# Patient Record
Sex: Male | Born: 1960 | Race: White | Hispanic: No | Smoking: Current every day smoker
Health system: Southern US, Community
[De-identification: ages and names within clinical notes are randomized; demographics above are authoritative.]

## PROBLEM LIST (undated history)

## (undated) DIAGNOSIS — I219 Acute myocardial infarction, unspecified: Secondary | ICD-10-CM

## (undated) DIAGNOSIS — E119 Type 2 diabetes mellitus without complications: Secondary | ICD-10-CM

## (undated) DIAGNOSIS — F1193 Opioid use, unspecified with withdrawal: Secondary | ICD-10-CM

## (undated) DIAGNOSIS — H269 Unspecified cataract: Secondary | ICD-10-CM

## (undated) DIAGNOSIS — F32A Depression, unspecified: Secondary | ICD-10-CM

## (undated) DIAGNOSIS — F1123 Opioid dependence with withdrawal: Secondary | ICD-10-CM

## (undated) DIAGNOSIS — F419 Anxiety disorder, unspecified: Secondary | ICD-10-CM

## (undated) DIAGNOSIS — K219 Gastro-esophageal reflux disease without esophagitis: Secondary | ICD-10-CM

## (undated) DIAGNOSIS — R0902 Hypoxemia: Secondary | ICD-10-CM

## (undated) DIAGNOSIS — K769 Liver disease, unspecified: Secondary | ICD-10-CM

## (undated) DIAGNOSIS — I639 Cerebral infarction, unspecified: Secondary | ICD-10-CM

## (undated) DIAGNOSIS — J45909 Unspecified asthma, uncomplicated: Secondary | ICD-10-CM

## (undated) DIAGNOSIS — I10 Essential (primary) hypertension: Secondary | ICD-10-CM

## (undated) DIAGNOSIS — R918 Other nonspecific abnormal finding of lung field: Secondary | ICD-10-CM

## (undated) DIAGNOSIS — E785 Hyperlipidemia, unspecified: Secondary | ICD-10-CM

## (undated) DIAGNOSIS — I509 Heart failure, unspecified: Secondary | ICD-10-CM

## (undated) DIAGNOSIS — Z720 Tobacco use: Secondary | ICD-10-CM

## (undated) DIAGNOSIS — F329 Major depressive disorder, single episode, unspecified: Secondary | ICD-10-CM

## (undated) DIAGNOSIS — J449 Chronic obstructive pulmonary disease, unspecified: Secondary | ICD-10-CM

## (undated) DIAGNOSIS — M1990 Unspecified osteoarthritis, unspecified site: Secondary | ICD-10-CM

## (undated) DIAGNOSIS — C801 Malignant (primary) neoplasm, unspecified: Secondary | ICD-10-CM

## (undated) DIAGNOSIS — N289 Disorder of kidney and ureter, unspecified: Secondary | ICD-10-CM

## (undated) DIAGNOSIS — J439 Emphysema, unspecified: Secondary | ICD-10-CM

## (undated) HISTORY — DX: Anxiety disorder, unspecified: F41.9

## (undated) HISTORY — DX: Gastro-esophageal reflux disease without esophagitis: K21.9

## (undated) HISTORY — DX: Major depressive disorder, single episode, unspecified: F32.9

## (undated) HISTORY — DX: Emphysema, unspecified: J43.9

## (undated) HISTORY — DX: Heart failure, unspecified: I50.9

## (undated) HISTORY — DX: Opioid use, unspecified with withdrawal: F11.93

## (undated) HISTORY — DX: Acute myocardial infarction, unspecified: I21.9

## (undated) HISTORY — DX: Opioid dependence with withdrawal: F11.23

## (undated) HISTORY — DX: Hypoxemia: R09.02

## (undated) HISTORY — DX: Depression, unspecified: F32.A

## (undated) HISTORY — PX: THROAT SURGERY: SHX803

## (undated) HISTORY — DX: Unspecified osteoarthritis, unspecified site: M19.90

## (undated) HISTORY — DX: Unspecified cataract: H26.9

## (undated) HISTORY — PX: NECK SURGERY: SHX720

## (undated) HISTORY — DX: Hyperlipidemia, unspecified: E78.5

---

## 2017-07-01 ENCOUNTER — Emergency Department (HOSPITAL_COMMUNITY)

## 2017-07-01 ENCOUNTER — Observation Stay (HOSPITAL_COMMUNITY)
Admission: EM | Admit: 2017-07-01 | Discharge: 2017-07-01 | Discharge status: Against Medical Advice | Attending: Cardiology | Admitting: Cardiology

## 2017-07-01 ENCOUNTER — Observation Stay (HOSPITAL_COMMUNITY)

## 2017-07-01 ENCOUNTER — Encounter (HOSPITAL_COMMUNITY): Payer: Self-pay | Admitting: *Deleted

## 2017-07-01 DIAGNOSIS — Z7902 Long term (current) use of antithrombotics/antiplatelets: Secondary | ICD-10-CM | POA: Diagnosis not present

## 2017-07-01 DIAGNOSIS — F17210 Nicotine dependence, cigarettes, uncomplicated: Secondary | ICD-10-CM | POA: Diagnosis not present

## 2017-07-01 DIAGNOSIS — I200 Unstable angina: Secondary | ICD-10-CM | POA: Diagnosis present

## 2017-07-01 DIAGNOSIS — Z794 Long term (current) use of insulin: Secondary | ICD-10-CM | POA: Diagnosis not present

## 2017-07-01 DIAGNOSIS — Z8673 Personal history of transient ischemic attack (TIA), and cerebral infarction without residual deficits: Secondary | ICD-10-CM | POA: Insufficient documentation

## 2017-07-01 DIAGNOSIS — Z79899 Other long term (current) drug therapy: Secondary | ICD-10-CM | POA: Diagnosis not present

## 2017-07-01 DIAGNOSIS — Z9981 Dependence on supplemental oxygen: Secondary | ICD-10-CM | POA: Diagnosis not present

## 2017-07-01 DIAGNOSIS — J449 Chronic obstructive pulmonary disease, unspecified: Secondary | ICD-10-CM | POA: Insufficient documentation

## 2017-07-01 DIAGNOSIS — I10 Essential (primary) hypertension: Secondary | ICD-10-CM | POA: Diagnosis not present

## 2017-07-01 DIAGNOSIS — E119 Type 2 diabetes mellitus without complications: Secondary | ICD-10-CM | POA: Insufficient documentation

## 2017-07-01 DIAGNOSIS — I700 Atherosclerosis of aorta: Secondary | ICD-10-CM | POA: Insufficient documentation

## 2017-07-01 DIAGNOSIS — C3490 Malignant neoplasm of unspecified part of unspecified bronchus or lung: Secondary | ICD-10-CM | POA: Insufficient documentation

## 2017-07-01 DIAGNOSIS — R0602 Shortness of breath: Secondary | ICD-10-CM | POA: Insufficient documentation

## 2017-07-01 HISTORY — DX: Chronic obstructive pulmonary disease, unspecified: J44.9

## 2017-07-01 HISTORY — DX: Other nonspecific abnormal finding of lung field: R91.8

## 2017-07-01 HISTORY — DX: Essential (primary) hypertension: I10

## 2017-07-01 HISTORY — DX: Malignant (primary) neoplasm, unspecified: C80.1

## 2017-07-01 HISTORY — DX: Unspecified asthma, uncomplicated: J45.909

## 2017-07-01 HISTORY — DX: Liver disease, unspecified: K76.9

## 2017-07-01 HISTORY — DX: Disorder of kidney and ureter, unspecified: N28.9

## 2017-07-01 HISTORY — DX: Cerebral infarction, unspecified: I63.9

## 2017-07-01 HISTORY — DX: Type 2 diabetes mellitus without complications: E11.9

## 2017-07-01 LAB — BASIC METABOLIC PANEL
Anion gap: 9 (ref 5–15)
BUN: 15 mg/dL (ref 6–20)
CHLORIDE: 103 mmol/L (ref 101–111)
CO2: 23 mmol/L (ref 22–32)
CREATININE: 0.84 mg/dL (ref 0.61–1.24)
Calcium: 9.2 mg/dL (ref 8.9–10.3)
GFR calc Af Amer: >60 mL/min (ref >60)
Glucose, Bld: 208 mg/dL — ABNORMAL HIGH (ref 65–99)
Potassium: 4.1 mmol/L (ref 3.5–5.1)
SODIUM: 135 mmol/L (ref 135–145)

## 2017-07-01 LAB — PROTIME-INR
INR: 1.04
Prothrombin Time: 13.6 seconds (ref 11.4–15.2)

## 2017-07-01 LAB — I-STAT CHEM 8, ED
BUN: 16 mg/dL (ref 6–20)
CALCIUM ION: 1.02 mmol/L — AB (ref 1.15–1.40)
CHLORIDE: 103 mmol/L (ref 101–111)
CREATININE: 0.7 mg/dL (ref 0.61–1.24)
GLUCOSE: 209 mg/dL — AB (ref 65–99)
HCT: 58 % — ABNORMAL HIGH (ref 39.0–52.0)
Hemoglobin: 19.7 g/dL — ABNORMAL HIGH (ref 13.0–17.0)
Potassium: 4.1 mmol/L (ref 3.5–5.1)
Sodium: 137 mmol/L (ref 135–145)
TCO2: 21 mmol/L (ref 0–100)

## 2017-07-01 LAB — CBC
HCT: 52.9 % — ABNORMAL HIGH (ref 39.0–52.0)
Hemoglobin: 18.4 g/dL — ABNORMAL HIGH (ref 13.0–17.0)
MCH: 28.9 pg (ref 26.0–34.0)
MCHC: 34.8 g/dL (ref 30.0–36.0)
MCV: 83.2 fL (ref 78.0–100.0)
PLATELETS: 330 10*3/uL (ref 150–400)
RBC: 6.36 MIL/uL — ABNORMAL HIGH (ref 4.22–5.81)
RDW: 13.7 % (ref 11.5–15.5)
WBC: 14.6 10*3/uL — AB (ref 4.0–10.5)

## 2017-07-01 LAB — I-STAT TROPONIN, ED: Troponin i, poc: 0 ng/mL (ref 0.00–0.08)

## 2017-07-01 LAB — APTT: APTT: 30 s (ref 24–36)

## 2017-07-01 LAB — TROPONIN I: Troponin I: <0.03 ng/mL (ref <0.03)

## 2017-07-01 MED ORDER — NITROGLYCERIN 0.4 MG SL SUBL
0.4000 mg | SUBLINGUAL_TABLET | SUBLINGUAL | Status: DC | PRN
  Filled 2017-07-01: qty 1

## 2017-07-01 MED ORDER — NITROGLYCERIN IN D5W 200-5 MCG/ML-% IV SOLN
5.0000 ug/min | Freq: Once | INTRAVENOUS | Status: AC
  Administered 2017-07-01: 5 ug/min via INTRAVENOUS
  Filled 2017-07-01: qty 250

## 2017-07-01 MED ORDER — MORPHINE SULFATE (PF) 4 MG/ML IV SOLN
4.0000 mg | INTRAVENOUS | Status: DC | PRN
  Administered 2017-07-01: 4 mg via INTRAVENOUS
  Filled 2017-07-01: qty 1

## 2017-07-01 MED ORDER — ASPIRIN 81 MG PO CHEW
324.0000 mg | CHEWABLE_TABLET | Freq: Once | ORAL | Status: AC
  Administered 2017-07-01: 324 mg via ORAL
  Filled 2017-07-01: qty 4

## 2017-07-01 MED ORDER — HEPARIN BOLUS VIA INFUSION: 4000.0000 [IU] | Freq: Once | INTRAVENOUS | Status: DC

## 2017-07-01 MED ORDER — HEPARIN (PORCINE) IN NACL 100-0.45 UNIT/ML-% IJ SOLN: 1150.0000 [IU]/h | INTRAMUSCULAR | Status: DC

## 2017-07-01 NOTE — ED Triage Notes (Signed)
Pt comes in with central chest pain starting around 1000 today. Pt has taken 3 nitro at home. He is a hospice patient and had had 2 stents placed.

## 2017-07-01 NOTE — ED Notes (Signed)
Pt now stating he is wanting to leave at this time d/t hospice dropping pt if he is transferred to [facility]. Attempted to inform pt about this situation and severity of condition. DO McManus notified at talked to pt. Case manager to talk to pt as well.

## 2017-07-01 NOTE — Care Management (Signed)
CM received call from EDP about pt refusing care due to hospice telling him he will be discharged if admitted to hospital. He is active with [facility] care and hospice. CM explained that pt will DC be discharged from hospice services, his medicare will pay for hospitalization and hospice will resume services when he is discharged. Pt states they told him they will not. He has been on hospice 8 years and has appointment with Dr. Nyra Capes 07/21/17 to establish care and at that time he will DC from hospice services. CM explained to pt that CM dept would ensure he has PCP and care at DC. That we have other options and we make sure to have him care established at DC. Pt says he wishes to leave AMA and will think about it for a few mins. CM called Judeen Hammans, with hospice provider, who states pt requested yesterday that his records be sent to hospice of [facility] and he wished to DC from their services. She also states pt is a drug seeker and even presented to their office the morning of 8/6 demanding pain medications. Since pt has left AMA he has contacted hospice provider and requested RN make visit tomorrow, visit was offered for tonight and pt refuses. Per sherry they also instructed pt to return to ED to receive needed medical care.

## 2017-07-01 NOTE — Progress Notes (Signed)
ANTICOAGULATION CONSULT NOTE - Initial Consult  Pharmacy Consult for Heparin Indication: chest pain/ACS  No Known Allergies  Patient Measurements: Height: 5\' 6"  (167.6 cm) Weight: 198 lb (89.8 kg) IBW/kg (Calculated) : 63.8 HEPARIN DW (KG): 82.8   Vital Signs: Temp: 98.4 F (36.9 C) (08/07 1315) Temp Source: Oral (08/07 1315) BP: 113/72 (08/07 1430) Pulse Rate: 103 (08/07 1430)  Labs:  Recent Labs  07/01/17 1315 07/01/17 1345  HGB 18.4* 19.7*  HCT 52.9* 58.0*  PLT 330  --   CREATININE 0.84 0.70  TROPONINI <0.03  --     Estimated Creatinine Clearance: 112.1 mL/min (by C-G formula based on SCr of 0.7 mg/dL).   Medical History: Past Medical History:  Diagnosis Date  . Asthma   . 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 COPD (chronic obstructive pulmonary disease) ([place])   . Diabetes mellitus without complication ([place])   . Hypertension   . Kidney disease   . Liver disease   . Lung mass   . Stroke [facility])    Medications:   (Not in a hospital admission)  Home meds reviewed.  Not on Anticoagulant PTA.  Assessment: Okay for Protocol. No bleeding noted, baseline labs pending.  Likely Tx to [facility] per pending ED note.  Goal of Therapy:  Heparin level 0.3-0.7 units/ml Monitor platelets by anticoagulation protocol: Yes   Plan:  Give 4000 units bolus x 1 Start heparin infusion at 1150 units/hr Check anti-Xa level in 6-8 hours and daily while on heparin Continue to monitor H&H and platelets  Pricilla Larsson 07/01/2017,2:48 PM

## 2017-07-01 NOTE — ED Notes (Signed)
Pt notified of possible worsening of medical conditions, permanent ailments, and death by this RN and EDP Thurnell Garbe. States he accepts these risks and is signing out [place]. Melinda at hospice care notified of pt's decision. Ambulatory to lobby at this time.

## 2017-07-01 NOTE — ED Notes (Signed)
Rip Harbour from hospice [phone]) called this RN to get update on Pt.

## 2017-07-01 NOTE — ED Notes (Signed)
EKG given to Dr. McManus.  

## 2017-07-01 NOTE — ED Notes (Signed)
I-stat troponin 0.00 result given to MD Thurnell Garbe.

## 2017-07-01 NOTE — ED Notes (Signed)
MD Thurnell Garbe notified of pt's conversation with hospice at this time. Per Hospice pt stated that he was going to die tonight so be prepared and he needed pain medicine. Pt still wanting to leave AMA.

## 2017-07-01 NOTE — ED Provider Notes (Signed)
[facility] DEPT Provider Note   CSN: [id number] Arrival date & time: 07/01/17  1259     History   Chief Complaint Chief Complaint  Patient presents with  . Chest Pain    HPI Roger Flores is a 56 y.o. male.  HPI  Pt was seen at 1320. Per pt, c/o gradual onset and worsening of multiple intermittent episodes of chest "pain" since yesterday. Pt describes the CP as mid-sternal, "tightness," with radiation into his jaw and teeth. Has been associated with nausea and diaphoresis. Pt endorses hx chronic SOB due to lung CA and is on chronic home O2 N/C. Pt also describes his CP as "like when I needed a stent." Symptoms occur on exertion, improve with his own SL ntg and resting. Pt's symptoms occurred again today and he took his own SL ntg x3 without improvement. Denies abd pain, no back pain, no fevers, no palpitations, no cough.    Past Medical History:  Diagnosis Date  . Asthma   . 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 COPD (chronic obstructive pulmonary disease) ([place])   . Diabetes mellitus without complication ([place])   . Hypertension   . Kidney disease   . Liver disease   . Lung mass   . Stroke [facility])     Patient Active Problem List   Diagnosis Date Noted  . Unstable angina pectoris ([place]) 07/01/2017    Past Surgical History:  Procedure Laterality Date  . NECK SURGERY         Home Medications    Prior to Admission medications   Not on File    Family History No family history on file.  Social History Social History  Substance Use Topics  . Smoking status: Current Every Day Smoker    Packs/day: 2.00    Types: Cigarettes  . Smokeless tobacco: Never Used  . Alcohol use No     Allergies   Patient has no known allergies.   Review of Systems Review of Systems ROS: Statement: All systems negative except as marked or noted in the HPI; Constitutional: Negative for fever and chills. ; ; Eyes: Negative for eye pain, redness and discharge. ; ; ENMT: Negative for ear  pain, hoarseness, nasal congestion, sinus pressure and sore throat. ; ; Cardiovascular: +CP, diaphoresis, chronic SOB. Negative for palpitations, and peripheral edema. ; ; Respiratory: Negative for cough, wheezing and stridor. ; ; Gastrointestinal: +N/V. Negative for diarrhea, abdominal pain, blood in stool, hematemesis, jaundice and rectal bleeding. . ; ; Genitourinary: Negative for dysuria, flank pain and hematuria. ; ; Musculoskeletal: Negative for back pain and neck pain. Negative for swelling and trauma.; ; Skin: Negative for pruritus, rash, abrasions, blisters, bruising and skin lesion.; ; Neuro: Negative for headache, lightheadedness and neck stiffness. Negative for weakness, altered level of consciousness, altered mental status, extremity weakness, paresthesias, involuntary movement, seizure and syncope.       Physical Exam Updated Vital Signs BP (!) 126/94 (BP Location: Right Arm)   Pulse (!) 109   Temp 98.4 F (36.9 C) (Oral)   Resp 18   Ht 5\' 6"  (1.676 m)   Wt 89.8 kg (198 lb)   SpO2 100%   BMI 31.96 kg/m    Patient Vitals for the past 24 hrs:  BP Temp Temp src Pulse Resp SpO2 Height Weight  07/01/17 1350 111/83 - - (!) 106 (!) 23 98 % - -  07/01/17 1330 (!) 120/96 - - (!) 108 - 98 % - -  07/01/17  1316 - - - - - - 5\' 6"  (1.676 m) 89.8 kg (198 lb)  07/01/17 1315 (!) 126/94 98.4 F (36.9 C) Oral (!) 109 18 100 % - -    Physical Exam 1325: Physical examination:  Nursing notes reviewed; Vital signs and O2 SAT reviewed;  Constitutional: Well developed, Well nourished, Well hydrated, Uncomfortable appearing.; Head:  Normocephalic, atraumatic; Eyes: EOMI, PERRL, No scleral icterus; ENMT: Mouth and pharynx normal, Mucous membranes moist; Neck: Supple, Full range of motion, No lymphadenopathy; Cardiovascular: Regular rate and rhythm, No gallop; Respiratory: Breath sounds clear & equal bilaterally, No wheezes.  Speaking full sentences with ease, Normal respiratory effort/excursion;  Chest: Nontender, Movement normal; Abdomen: Soft, Nontender, Nondistended, Normal bowel sounds; Genitourinary: No CVA tenderness; Extremities: Pulses normal, No tenderness, No edema, No calf edema or asymmetry.; Neuro: AA&Ox3, Major CN grossly intact.  Speech clear. No gross focal motor or sensory deficits in extremities.; Skin: Color normal, Warm, Dry.   ED Treatments / Results  Labs (all labs ordered are listed, but only abnormal results are displayed)   EKG  EKG Interpretation  Date/Time:  Tuesday July 01 2017 13:25:43 EDT  2nd EKG Ventricular Rate:  99 PR Interval:    QRS Duration: 131 QT Interval:  348 QTC Calculation: 447 R Axis:   65 Text Interpretation:  Sinus rhythm Probable left atrial enlargement Right bundle branch block Baseline wander No old tracing to compare Confirmed by Francine Graven [phone]) on 07/01/2017 1:49:04 PM       ED ECG REPORT   Date: 07/01/2017  1312 1st EKG  Rate: 108  Rhythm: sinus tachycardia  QRS Axis: normal  Intervals: normal  ST/T Wave abnormalities: nonspecific ST/T changes  Conduction Disutrbances:right bundle branch block  Narrative Interpretation: baseline wander, needs repeat EKG  Old EKG Reviewed: none available    Radiology   Procedures Procedures (including critical care time)  Medications Ordered in ED Medications  nitroGLYCERIN (NITROSTAT) SL tablet 0.4 mg (not administered)  morphine 4 MG/ML injection 4 mg (4 mg Intravenous Given 07/01/17 1334)  nitroGLYCERIN 50 mg in dextrose 5 % 250 mL (0.2 mg/mL) infusion (5 mcg/min Intravenous New Bag/Given 07/01/17 1334)  aspirin chewable tablet 324 mg (324 mg Oral Given 07/01/17 1334)     Initial Impression / Assessment and Plan / ED Course  I have reviewed the triage vital signs and the nursing notes.  Pertinent labs & imaging results that were available during my care of the patient were reviewed by me and considered in my medical decision making (see chart for  details).  MDM Reviewed: previous chart, nursing note and vitals Interpretation: labs, ECG and x-ray Total time providing critical care: 30-74 minutes. This excludes time spent performing separately reportable procedures and services. Consults: cardiology   CRITICAL CARE Performed by: Alfonzo Feller Total critical care time: 35 minutes Critical care time was exclusive of separately billable procedures and treating other patients. Critical care was necessary to treat or prevent imminent or life-threatening deterioration. Critical care was time spent personally by me on the following activities: development of treatment plan with patient and/or surrogate as well as nursing, discussions with consultants, evaluation of patient's response to treatment, examination of patient, obtaining history from patient or surrogate, ordering and performing treatments and interventions, ordering and review of laboratory studies, ordering and review of radiographic studies, pulse oximetry and re-evaluation of patient's condition.   Results for orders placed or performed during the hospital encounter of 21/19/41  Basic metabolic panel  Result Value Ref Range  Sodium 135 135 - 145 mmol/L   Potassium 4.1 3.5 - 5.1 mmol/L   Chloride 103 101 - 111 mmol/L   CO2 23 22 - 32 mmol/L   Glucose, Bld 208 (H) 65 - 99 mg/dL   BUN 15 6 - 20 mg/dL   Creatinine, Ser 0.84 0.61 - 1.24 mg/dL   Calcium 9.2 8.9 - 10.3 mg/dL   GFR calc non Af Amer >60 >60 mL/min   GFR calc Af Amer >60 >60 mL/min   Anion gap 9 5 - 15  CBC  Result Value Ref Range   WBC 14.6 (H) 4.0 - 10.5 K/uL   RBC 6.36 (H) 4.22 - 5.81 MIL/uL   Hemoglobin 18.4 (H) 13.0 - 17.0 g/dL   HCT 52.9 (H) 39.0 - 52.0 %   MCV 83.2 78.0 - 100.0 fL   MCH 28.9 26.0 - 34.0 pg   MCHC 34.8 30.0 - 36.0 g/dL   RDW 13.7 11.5 - 15.5 %   Platelets 330 150 - 400 K/uL  Troponin I  Result Value Ref Range   Troponin I <0.03 <0.03 ng/mL  Protime-INR  Result Value Ref  Range   Prothrombin Time 13.6 11.4 - 15.2 seconds   INR 1.04   APTT  Result Value Ref Range   aPTT 30 24 - 36 seconds  I-stat troponin, ED  Result Value Ref Range   Troponin i, poc 0.00 0.00 - 0.08 ng/mL   Comment 3          I-stat Chem 8, ED  Result Value Ref Range   Sodium 137 135 - 145 mmol/L   Potassium 4.1 3.5 - 5.1 mmol/L   Chloride 103 101 - 111 mmol/L   BUN 16 6 - 20 mg/dL   Creatinine, Ser 0.70 0.61 - 1.24 mg/dL   Glucose, Bld 209 (H) 65 - 99 mg/dL   Calcium, Ion 1.02 (L) 1.15 - 1.40 mmol/L   TCO2 21 0 - 100 mmol/L   Hemoglobin 19.7 (H) 13.0 - 17.0 g/dL   HCT 58.0 (H) 39.0 - 52.0 %   Dg Chest Port 1 View Result Date: 07/01/2017 CLINICAL DATA:  Chest pain EXAM: PORTABLE CHEST 1 VIEW COMPARISON:  None. FINDINGS: There is no edema or consolidation. Heart size and pulmonary vascularity are normal. No adenopathy. There is atherosclerotic calcification aorta. No pneumothorax. No evident bone lesion. IMPRESSION: Aortic atherosclerosis.  No edema or consolidation. Aortic Atherosclerosis (ICD10-I70.0). Electronically Signed   By: Lowella Grip III M.D.   On: 07/01/2017 14:12     1335:  Concerning HPI. ASA, SL and IV ntg ordered, as well as IV heparin.  Clarified goals of care with pt and he stated he was "getting rid of hospice" (on Hospice for lung CA) and "wants everything done."  T/C to [facility]  Cards Dr. Ellyn Hack, case discussed, including:  HPI, pertinent PM/SHx, VS/PE, dx testing, ED course and treatment:  Agrees with ED tx and that pt not Code STEMI at this time but HPI is concerning, transfer to [facility] Stepdown/Dr. Jacalyn Lefevre service.   1520:  Pt now refuses to stay for admission "because hospice is going to drop me and I don't trust them to pick me up again." ED RN and I made multiple calls to [facility] CM Jolene Provost and Hospice RN [place]. Both of them have spoken extensively with pt. Pt continues to state the above and that he is "going to leave."  Pt and family informed re: dx  testing results, including concerning HPI for  cardiac source of his pain, and that I recommend transfer and admission for further evaluation.  Pt refuses admission.  I encouraged pt to stay, continues to refuse.  Pt makes his own medical decisions.  Risks of AMA explained to pt and family, including, but not limited to:  stroke, heart attack, cardiac arrythmia ("irregular heart rate/beat"), "passing out," temporary and/or permanent disability, death.  Pt and family verb understanding and continue to refuse admission, understanding the consequences of their decision.  I encouraged pt to follow up with his Cardiologist today and return to the ED immediately if symptoms return, or for any other concerns.  Pt and family verb understanding, agreeable.   Final Clinical Impressions(s) / ED Diagnoses   Final diagnoses:  Unstable angina [facility])    New Prescriptions New Prescriptions   No medications on file     Francine Graven, DO 07/04/17 [id number]

## 2017-07-02 ENCOUNTER — Encounter (HOSPITAL_COMMUNITY): Payer: Self-pay | Admitting: Emergency Medicine

## 2017-07-02 ENCOUNTER — Emergency Department (HOSPITAL_COMMUNITY)
Admission: EM | Admit: 2017-07-02 | Discharge: 2017-07-02 | Disposition: A | Discharge status: Home / Self Care | Attending: Emergency Medicine | Admitting: Emergency Medicine

## 2017-07-02 DIAGNOSIS — R112 Nausea with vomiting, unspecified: Secondary | ICD-10-CM | POA: Diagnosis present

## 2017-07-02 DIAGNOSIS — E119 Type 2 diabetes mellitus without complications: Secondary | ICD-10-CM | POA: Diagnosis not present

## 2017-07-02 DIAGNOSIS — Z79899 Other long term (current) drug therapy: Secondary | ICD-10-CM | POA: Diagnosis not present

## 2017-07-02 DIAGNOSIS — I10 Essential (primary) hypertension: Secondary | ICD-10-CM | POA: Insufficient documentation

## 2017-07-02 DIAGNOSIS — J45909 Unspecified asthma, uncomplicated: Secondary | ICD-10-CM | POA: Diagnosis not present

## 2017-07-02 DIAGNOSIS — R1114 Bilious vomiting: Secondary | ICD-10-CM

## 2017-07-02 DIAGNOSIS — J449 Chronic obstructive pulmonary disease, unspecified: Secondary | ICD-10-CM | POA: Insufficient documentation

## 2017-07-02 DIAGNOSIS — F17210 Nicotine dependence, cigarettes, uncomplicated: Secondary | ICD-10-CM | POA: Diagnosis not present

## 2017-07-02 DIAGNOSIS — Z7902 Long term (current) use of antithrombotics/antiplatelets: Secondary | ICD-10-CM | POA: Diagnosis not present

## 2017-07-02 MED ORDER — MORPHINE SULFATE (PF) 10 MG/ML IV SOLN
10.0000 mg | Freq: Once | INTRAVENOUS | Status: AC
  Administered 2017-07-02: 10 mg via INTRAMUSCULAR
  Filled 2017-07-02: qty 1

## 2017-07-02 NOTE — ED Notes (Signed)
Pt ambulatory to waiting room. Pt verbalized understanding of discharge instructions.   

## 2017-07-02 NOTE — ED Provider Notes (Signed)
[facility] DEPT Provider Note   CSN: [id number] Arrival date & time: 07/02/17  1747     History   Chief Complaint Chief Complaint  Patient presents with  . Withdrawal    HPI Roger Flores is a 56 y.o. male.  HPI  Patient presents with concern of nausea, vomiting. Onset seems to began yesterday,though symptoms are worse today. No new focal pain, including no new chest pain. Patient does acknowledge ongoing frequent episodes of generalized discomfort and chest pain, which she treats to his history of lung cancer which is currently not receiving therapy, and is enrolled in hospice care. He notes that since his evaluation yesterday he has not taken any of his pain medication, has felt the worsening of nausea during that period. No new fever, confusion, disorientation, dyspnea. Patient takes morphine 30 mg, every 4 hours.   Past Medical History:  Diagnosis Date  . Asthma   . Cancer ([employer])    Larynx  . COPD (chronic obstructive pulmonary disease) ([place])   . Diabetes mellitus without complication ([place])   . Hypertension   . Kidney disease   . Liver disease   . Lung mass   . Stroke [facility])     Patient Active Problem List   Diagnosis Date Noted  . Unstable angina pectoris ([place]) 07/01/2017    Past Surgical History:  Procedure Laterality Date  . NECK SURGERY         Home Medications    Prior to Admission medications   Medication Sig Start Date End Date Taking? Authorizing Provider  clopidogrel (PLAVIX) 75 MG tablet Take 75 mg by mouth daily.    [provider]  docusate sodium (COLACE) 100 MG capsule Take 100 mg by mouth 2 (two) times daily as needed for constipation.    [provider]  furosemide (LASIX) 40 MG tablet Take 40 mg by mouth daily as needed for fluid.    [provider]  gabapentin (NEURONTIN) 600 MG tablet Take 600 mg by mouth 3 (three) times daily.    [provider]  insulin aspart (NOVOLOG) 100 UNIT/ML  injection Inject 8-13 Units into the skin 4 (four) times daily. 8 units before each meal and 13 units at bedtime.    [provider]  LORazepam (ATIVAN) 0.5 MG tablet Take 0.5 mg by mouth every 4 (four) hours as needed for anxiety.    [provider]  morphine (MSIR) 30 MG tablet Take 30 mg by mouth every 4 (four) hours as needed for severe pain.    [provider]  nitroGLYCERIN (NITROSTAT) 0.4 MG SL tablet Place 1 tablet under the tongue daily as needed for chest pain. 05/23/17   [provider]  ondansetron (ZOFRAN) 4 MG tablet Take 1 tablet by mouth every 8 (eight) hours as needed for nausea/vomiting.    [provider]  tamsulosin (FLOMAX) 0.4 MG CAPS capsule Take 0.4 mg by mouth daily.    [provider]    Family History History reviewed. No pertinent family history.  Social History Social History  Substance Use Topics  . Smoking status: Current Every Day Smoker    Packs/day: 2.00    Types: Cigarettes  . Smokeless tobacco: Never Used  . Alcohol use No     Allergies   Patient has no known allergies.   Review of Systems Review of Systems  Constitutional:       Per HPI, otherwise negative  HENT:       Per HPI, otherwise  negative  Respiratory:       Per HPI, otherwise negative  Cardiovascular:       Per HPI, otherwise negative  Gastrointestinal: Positive for nausea and vomiting.  Endocrine:       Negative aside from HPI  Genitourinary:       Neg aside from HPI   Musculoskeletal:       Per HPI, otherwise negative  Skin: Negative.   Allergic/Immunologic:       Lung CA, per report  Neurological: Negative for syncope.     Physical Exam Updated Vital Signs BP 132/78 (BP Location: Right Arm)   Pulse (!) 108   Temp 98.3 F (36.8 C) (Oral)   Resp 18   Ht 5\' 6"  (1.676 m)   Wt 89.8 kg (198 lb)   SpO2 97%   BMI 31.96 kg/m   Physical Exam  Constitutional: He is oriented to person, place, and time. He appears  well-developed. No distress.  HENT:  Head: Normocephalic and atraumatic.  Eyes: Conjunctivae and EOM are normal.  Cardiovascular: Normal rate and regular rhythm.   Pulmonary/Chest: Effort normal. No stridor. No respiratory distress.  Abdominal: He exhibits no distension. There is no tenderness.  Musculoskeletal: He exhibits no edema.  Neurological: He is alert and oriented to person, place, and time.  Skin: Skin is warm and dry.  Psychiatric: He has a normal mood and affect.  Nursing note and vitals reviewed.    ED Treatments / Results  Labs (all labs ordered are listed, but only abnormal results are displayed) Labs Reviewed - No data to display  EKG  EKG Interpretation None       Radiology Dg Chest [facility] 1 View  Result Date: 07/01/2017 CLINICAL DATA:  Chest pain EXAM: PORTABLE CHEST 1 VIEW COMPARISON:  None. FINDINGS: There is no edema or consolidation. Heart size and pulmonary vascularity are normal. No adenopathy. There is atherosclerotic calcification aorta. No pneumothorax. No evident bone lesion. IMPRESSION: Aortic atherosclerosis.  No edema or consolidation. Aortic Atherosclerosis (ICD10-I70.0). Electronically Signed   By: Lowella Grip III M.D.   On: 07/01/2017 14:12    Procedures Procedures (including critical care time)  Medications Ordered in ED Medications - No data to display   Initial Impression / Assessment and Plan / ED Course  I have reviewed the triage vital signs and the nursing notes.  Pertinent labs & imaging results that were available during my care of the patient were reviewed by me and considered in my medical decision making (see chart for details).  After the initial evaluation I reviewed the patient's chart including documentation from yesterday's evaluation for chest pain Patient also had catheterization 1 year ago at another facility, results below.  Addendum by Adam Phenix, MD on 05/29/2016 11:50 AM     1. No significant  coronary artery disease   2. Mild pulmonary hypertension   3. Normal pulmonary capillary wedge pressure    RECOMMENDATIONS:  1.Aspirin daily    2.Risk factor modification and medical therapy.      Cardiac catheterization (05/29/2016 11:35 AM)  Narrative     1. No significant coronary artery disease   2. Mild pulmonary hypertension   3. Normal pulmonary capillary wedge pressure    RECOMMENDATIONS:  1.Aspirin daily    2.Risk factor modification and medical therapy.     CT angiography from 2016, as below:  Impression- 1. No pulmonary embolus. 2. Nonaneurysmal thoracic aorta. 3. Mild to moderate emphysema in both upper lobes as well as a  few tiny nodules in both upper lobes. If the patient has a smoking history, consider follow-up chest CT in 6-12 months. 4. No lymphadenopathy. 5. Fatty infiltration of the liver with mild cirrhotic change. 6. Overnight report provided to the ER at 0435 hrs on 11/24/2015.   Transcriptionist-LT  Dictating Physician- Lannette Donath , M.D.  Dictated Date Time- 11/24/15 0600  Releasing Physician- Lannette Donath , M.D.  Released Date Time- 11/24/15 0930  Nuclear medicine bone scan from about the same time as below  Nuclear medicine whole-body bone scan-  HISTORY- Chronic back pain.  TECHNIQUE- 26.4 mCi technetium 52m MDP were administered intravenously followed by delayed anterior and posterior planar imaging of the skeleton. Exam is compared to lumbar spine CT from earlier this day.  FINDINGS- Radionuclide distribution in the skeleton is normal and symmetric. There is no abnormal increased or decreased uptake. There is physiologic activity in the kidneys and urinary bladder.  Impression- Normal whole body bone scan.   Transcriptionist-N/A  Dictating Physician- Lannette Donath , M.D.  Dictated Date Time- 08/30/15 1337  Releasing Physician- Lannette Donath ,  M.D.  Released Date Time- 08/30/15 1341   Chart review does not demonstrate recent studies concerning for diffuse metastatic disease, though the patient may have other causes for his enrollment in hospice care.   on repeat exam after receiving muscular morphine for concern of possible withdrawal, the patient had improved condition. We discussed importance of following up with his outpatient providers to insure appropriate consideration of his narcotic dependence.  With no evidence for complicated withdrawal, no decompensation, the patient is appropriate for outpatient follow-up. Given the patient's improvement is discharged in stable condition.  Final Clinical Impressions(s) / ED Diagnoses  Nausea and vomiting   Carmin Muskrat, MD 07/02/17 2037

## 2017-07-02 NOTE — ED Triage Notes (Signed)
Pt c/o of withdrawal symptoms starting yesterday. Pt c/o sweating, shaking, N/V and chest pain. Pt states " I ran out of my morphine yesterday and Hospice told me to come up here to get pani control".  Pt takes Morphine 30mg  Q4hr.

## 2017-07-02 NOTE — Discharge Instructions (Signed)
As discussed, your evaluation today has been largely reassuring.  But, it is important that you monitor your condition carefully, and do not hesitate to return to the ED if you develop new, or concerning changes in your condition. ? ?Otherwise, please follow-up with your physician for appropriate ongoing care. ? ?

## 2017-07-03 ENCOUNTER — Emergency Department (HOSPITAL_COMMUNITY)
Admission: EM | Admit: 2017-07-03 | Discharge: 2017-07-04 | Disposition: A | Discharge status: Home / Self Care | Attending: Emergency Medicine | Admitting: Emergency Medicine

## 2017-07-03 ENCOUNTER — Emergency Department (HOSPITAL_COMMUNITY)

## 2017-07-03 ENCOUNTER — Encounter (HOSPITAL_COMMUNITY): Payer: Self-pay | Admitting: Emergency Medicine

## 2017-07-03 DIAGNOSIS — J45909 Unspecified asthma, uncomplicated: Secondary | ICD-10-CM | POA: Insufficient documentation

## 2017-07-03 DIAGNOSIS — Z7983 Long term (current) use of bisphosphonates: Secondary | ICD-10-CM | POA: Diagnosis not present

## 2017-07-03 DIAGNOSIS — I2699 Other pulmonary embolism without acute cor pulmonale: Secondary | ICD-10-CM | POA: Insufficient documentation

## 2017-07-03 DIAGNOSIS — I10 Essential (primary) hypertension: Secondary | ICD-10-CM | POA: Insufficient documentation

## 2017-07-03 DIAGNOSIS — Z794 Long term (current) use of insulin: Secondary | ICD-10-CM | POA: Insufficient documentation

## 2017-07-03 DIAGNOSIS — R0602 Shortness of breath: Secondary | ICD-10-CM | POA: Insufficient documentation

## 2017-07-03 DIAGNOSIS — F17210 Nicotine dependence, cigarettes, uncomplicated: Secondary | ICD-10-CM | POA: Diagnosis not present

## 2017-07-03 DIAGNOSIS — J449 Chronic obstructive pulmonary disease, unspecified: Secondary | ICD-10-CM | POA: Diagnosis not present

## 2017-07-03 DIAGNOSIS — E119 Type 2 diabetes mellitus without complications: Secondary | ICD-10-CM | POA: Diagnosis not present

## 2017-07-03 DIAGNOSIS — Z79899 Other long term (current) drug therapy: Secondary | ICD-10-CM | POA: Insufficient documentation

## 2017-07-03 DIAGNOSIS — R079 Chest pain, unspecified: Secondary | ICD-10-CM | POA: Diagnosis present

## 2017-07-03 LAB — CBC
HEMATOCRIT: 54.1 % — AB (ref 39.0–52.0)
HEMOGLOBIN: 18.6 g/dL — AB (ref 13.0–17.0)
MCH: 28.2 pg (ref 26.0–34.0)
MCHC: 34.4 g/dL (ref 30.0–36.0)
MCV: 82 fL (ref 78.0–100.0)
Platelets: 349 10*3/uL (ref 150–400)
RBC: 6.6 MIL/uL — AB (ref 4.22–5.81)
RDW: 14 % (ref 11.5–15.5)
WBC: 14.3 10*3/uL — AB (ref 4.0–10.5)

## 2017-07-03 LAB — COMPREHENSIVE METABOLIC PANEL
ALBUMIN: 3.5 g/dL (ref 3.5–5.0)
ALT: 64 U/L — ABNORMAL HIGH (ref 17–63)
ANION GAP: 9 (ref 5–15)
AST: 37 U/L (ref 15–41)
Alkaline Phosphatase: 50 U/L (ref 38–126)
BUN: 12 mg/dL (ref 6–20)
CALCIUM: 9.1 mg/dL (ref 8.9–10.3)
CO2: 23 mmol/L (ref 22–32)
Chloride: 103 mmol/L (ref 101–111)
Creatinine, Ser: 0.79 mg/dL (ref 0.61–1.24)
GFR calc Af Amer: >60 mL/min (ref >60)
Glucose, Bld: 151 mg/dL — ABNORMAL HIGH (ref 65–99)
POTASSIUM: 4.4 mmol/L (ref 3.5–5.1)
Sodium: 135 mmol/L (ref 135–145)
TOTAL PROTEIN: 7.5 g/dL (ref 6.5–8.1)
Total Bilirubin: 0.4 mg/dL (ref 0.3–1.2)

## 2017-07-03 LAB — CBG MONITORING, ED: GLUCOSE-CAPILLARY: 111 mg/dL — AB (ref 65–99)

## 2017-07-03 LAB — I-STAT TROPONIN, ED: Troponin i, poc: 0 ng/mL (ref 0.00–0.08)

## 2017-07-03 LAB — BRAIN NATRIURETIC PEPTIDE: B NATRIURETIC PEPTIDE 5: 8.2 pg/mL (ref 0.0–100.0)

## 2017-07-03 MED ORDER — MORPHINE SULFATE (PF) 4 MG/ML IV SOLN
4.0000 mg | Freq: Once | INTRAVENOUS | Status: AC
  Administered 2017-07-03: 4 mg via INTRAVENOUS
  Filled 2017-07-03: qty 1

## 2017-07-03 MED ORDER — MORPHINE SULFATE (PF) 4 MG/ML IV SOLN
4.0000 mg | Freq: Once | INTRAVENOUS | Status: AC
  Administered 2017-07-04: 4 mg via INTRAVENOUS
  Filled 2017-07-03: qty 1

## 2017-07-03 MED ORDER — ASPIRIN 81 MG PO CHEW
324.0000 mg | CHEWABLE_TABLET | Freq: Once | ORAL | Status: AC
  Administered 2017-07-03: 324 mg via ORAL
  Filled 2017-07-03: qty 4

## 2017-07-03 MED ORDER — FUROSEMIDE 10 MG/ML IJ SOLN
40.0000 mg | Freq: Once | INTRAMUSCULAR | Status: AC
  Administered 2017-07-03: 40 mg via INTRAVENOUS
  Filled 2017-07-03: qty 4

## 2017-07-03 MED ORDER — IOPAMIDOL (ISOVUE-370) INJECTION 76%
INTRAVENOUS | Status: AC
  Filled 2017-07-03: qty 100

## 2017-07-03 MED ORDER — IPRATROPIUM-ALBUTEROL 0.5-2.5 (3) MG/3ML IN SOLN
3.0000 mL | Freq: Once | RESPIRATORY_TRACT | Status: AC
  Administered 2017-07-03: 3 mL via RESPIRATORY_TRACT
  Filled 2017-07-03: qty 3

## 2017-07-03 MED ORDER — SODIUM CHLORIDE 0.9 % IV BOLUS (SEPSIS)
1000.0000 mL | Freq: Once | INTRAVENOUS | Status: AC
  Administered 2017-07-03: 1000 mL via INTRAVENOUS

## 2017-07-03 MED ORDER — IOPAMIDOL (ISOVUE-370) INJECTION 76%
INTRAVENOUS | Status: AC
  Administered 2017-07-03: 100 mL
  Filled 2017-07-03: qty 100

## 2017-07-03 MED ORDER — NITROGLYCERIN 0.4 MG SL SUBL
0.4000 mg | SUBLINGUAL_TABLET | SUBLINGUAL | Status: DC | PRN
  Administered 2017-07-03 (×2): 0.4 mg via SUBLINGUAL
  Filled 2017-07-03: qty 1

## 2017-07-03 MED ORDER — METHYLPREDNISOLONE SODIUM SUCC 125 MG IJ SOLR
80.0000 mg | Freq: Once | INTRAMUSCULAR | Status: AC
  Administered 2017-07-03: 80 mg via INTRAVENOUS
  Filled 2017-07-03: qty 2

## 2017-07-03 NOTE — ED Notes (Signed)
Pt becoming agitated that he hasn't received his "morphine for chronic lung pain". Encouraged pt to remain in room and allow me to speak with the EDP. Pt cooperative at this time.

## 2017-07-03 NOTE — ED Notes (Signed)
Patient up to desk stating he would like to go home.  This RN reviewed chart, encouraged patient to stay due to his complaints.  Patient created a "bed" on one of our benches, changed out home O2 for our tank, and patient agreed to wait while RN attempts to prioritize rooming.  Pt made aware of the fact that his care has already been started, several results are back, and MD will have info upon rooming.

## 2017-07-03 NOTE — ED Notes (Signed)
Patient updated on delays and apologized for wait

## 2017-07-03 NOTE — ED Provider Notes (Signed)
[facility] DEPT Provider Note   CSN: [id number] Arrival date & time: 07/03/17  1449     History   Chief Complaint Chief Complaint  Patient presents with  . Chest Pain    HPI Roger Flores is a 56 y.o. male.  HPI   Roger Flores is a 56 y.o. male, with a history of cancer, COPD, HTN, and DM, presenting to the ED with retrosternal chest pain beginning two days ago. Chest pain worsened significantly today while walking today around 1PM. Took 1 NTG, CP resolved, started walking again, and CP returned. Pain substernal chest pain, radiates to the left jaw and eft upper back, 4/10 currently, described as a pressure. Also endorses increased SOB, orthopnea, and peripheral edema over baseline over the past two days.  Patient has a productive cough with hemoptysis, but states that this is his baseline due to his lung cancer.  Denies alcohol or illicit drug use. Voices compliance with all medications. Patient had a cardiac cath performed on 05/29/16 that showed no significant CAD. Showed mild pulmonary HTN.   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 States he was originally put on hospice following the discovery of a lung tumor on CT scan performed at [facility]. He does not have a cardiologist due to begin on hospice. He was seen in the ED at [facility] two days ago for his chest pain, but refused recommended transfer to [facility] because he was told hospice would drop him from their service. He now doesn't care if they drop him and wants to be treated.   Denies N/V/D, fever/chills, abdominal pain, or any other complaints.   Past Medical History:  Diagnosis Date  . Asthma   . Cancer (Roger Flores)    Larynx  . COPD (chronic obstructive pulmonary disease) ([place])   . Diabetes mellitus without complication ([place])   . Hypertension   . Kidney disease   . Liver disease   . Lung mass   . Stroke [facility])     Patient Active Problem List   Diagnosis Date Noted  .  Unstable angina pectoris ([place]) 07/01/2017    Past Surgical History:  Procedure Laterality Date  . NECK SURGERY         Home Medications    Prior to Admission medications   Medication Sig Start Date End Date Taking? Authorizing Provider  clopidogrel (PLAVIX) 75 MG tablet Take 75 mg by mouth daily.   Yes [provider]  docusate sodium (COLACE) 100 MG capsule Take 100 mg by mouth 2 (two) times daily as needed for constipation.   Yes [provider]  feeding supplement, GLUCERNA SHAKE, (GLUCERNA SHAKE) LIQD Take 237 mLs by mouth 4 (four) times daily.   Yes [provider]  furosemide (LASIX) 40 MG tablet Take 40 mg by mouth daily as needed for fluid.   Yes [provider]  gabapentin (NEURONTIN) 600 MG tablet Take 600 mg by mouth 3 (three) times daily.   Yes [provider]  insulin aspart (NOVOLOG) 100 UNIT/ML injection Inject 8-13 Units into the skin 4 (four) times daily. 8 units before each meal and 13 units at bedtime.   Yes [provider]  LORazepam (ATIVAN) 0.5 MG tablet Take 0.5 mg by mouth every 4 (four) hours as needed for anxiety.   Yes [provider]  morphine (MSIR) 30 MG tablet Take 30 mg by mouth every 4 (four) hours.    Yes  [provider]  nitroGLYCERIN (NITROSTAT) 0.4 MG SL tablet Place 1 tablet under the tongue daily as needed for chest pain. 05/23/17  Yes [provider]  ondansetron (ZOFRAN) 4 MG tablet Take 1 tablet by mouth every 8 (eight) hours as needed for nausea/vomiting.   Yes [provider]  tamsulosin (FLOMAX) 0.4 MG CAPS capsule Take 0.4 mg by mouth daily.   Yes [provider]    Family History No family history on file.  Social History Social History  Substance Use Topics  . Smoking status: Current Every Day Smoker    Packs/day: 2.00    Types: Cigarettes  . Smokeless tobacco: Never Used  . Alcohol use No     Allergies   Patient has no known  allergies.   Review of Systems Review of Systems  Constitutional: Negative for chills and fever.  Respiratory: Positive for shortness of breath.        Orthopnea  Cardiovascular: Positive for chest pain and leg swelling.  Gastrointestinal: Negative for abdominal pain, diarrhea, nausea and vomiting.  Neurological: Negative for weakness.  All other systems reviewed and are negative.    Physical Exam Updated Vital Signs BP 123/77   Pulse (!) 102   Temp 98.6 F (37 C) (Oral)   Resp 18   Ht 5\' 6"  (1.676 m)   Wt 89.8 kg (198 lb)   SpO2 97%   BMI 31.96 kg/m   Physical Exam  Constitutional: He appears well-developed and well-nourished. No distress.  HENT:  Head: Normocephalic and atraumatic.  Eyes: Conjunctivae are normal.  Neck: Neck supple.  Cardiovascular: Normal rate, regular rhythm, normal heart sounds and intact distal pulses.   Pulmonary/Chest: Effort normal. Tachypnea noted. No respiratory distress. He has rales in the right middle field, the right lower field, the left middle field and the left lower field.  Increased work of breathing. Patient is noted to be on 3 L/m O2 which he states is his baseline amount. Noted to have SPO2 at 95% at rest while on his supplemental O2. SPO2 drops to 89% with even slight exertion.  Abdominal: Soft. There is no tenderness. There is no guarding.  Musculoskeletal: He exhibits no edema.  Lymphadenopathy:    He has no cervical adenopathy.  Neurological: He is alert.  Skin: Skin is warm and dry. He is not diaphoretic.  Psychiatric: He has a normal mood and affect. His behavior is normal.  Nursing note and vitals reviewed.    ED Treatments / Results  Labs (all labs ordered are listed, but only abnormal results are displayed) Labs Reviewed  CBC - Abnormal; Notable for the following:       Result Value   WBC 14.3 (*)    RBC 6.60 (*)    Hemoglobin 18.6 (*)    HCT 54.1 (*)    All other components within normal limits  COMPREHENSIVE  METABOLIC PANEL - Abnormal; Notable for the following:    Glucose, Bld 151 (*)    ALT 64 (*)    All other components within normal limits  CBG MONITORING, ED - Abnormal; Notable for the following:    Glucose-Capillary 111 (*)    All other components within normal limits  BRAIN NATRIURETIC PEPTIDE  I-STAT TROPONIN, ED    EKG  EKG Interpretation  Date/Time:  Thursday July 03 2017 14:57:45 EDT Ventricular Rate:  108 PR Interval:  122 QRS Duration: 130 QT Interval:  352 QTC Calculation: 471 R Axis:   30 Text Interpretation:  Sinus tachycardia Right bundle branch block Abnormal ECG No significant change since last tracing Confirmed by Wandra Arthurs ([id number]) on 07/03/2017 7:35:37 PM       Radiology Dg Chest 2 View  Result Date: 07/03/2017 CLINICAL DATA:  Chest pain EXAM: CHEST  2 VIEW COMPARISON:  July 01, 2017 FINDINGS: There is atelectatic change in the anterior left base. Lungs elsewhere are clear. Heart size and pulmonary vascularity are normal. No adenopathy. There is aortic atherosclerosis. No bone lesions. IMPRESSION: Atelectatic change anterior left base. Lungs elsewhere clear. Stable cardiac silhouette. There is aortic atherosclerosis. Aortic Atherosclerosis (ICD10-I70.0). Electronically Signed   By: Lowella Grip III M.D.   On: 07/03/2017 15:36    Procedures Procedures (including critical care time)  Medications Ordered in ED Medications  nitroGLYCERIN (NITROSTAT) SL tablet 0.4 mg (0.4 mg Sublingual Given 07/03/17 2244)  iopamidol (ISOVUE-370) 76 % injection (not administered)  morphine 4 MG/ML injection 4 mg (not administered)  furosemide (LASIX) injection 40 mg (40 mg Intravenous Given 07/03/17 2021)  aspirin chewable tablet 324 mg (324 mg Oral Given 07/03/17 1956)  morphine 4 MG/ML injection 4 mg (4 mg Intravenous Given 07/03/17 2051)  ipratropium-albuterol (DUONEB) 0.5-2.5 (3) MG/3ML nebulizer solution 3 mL (3 mLs Nebulization Given 07/03/17 2240)  methylPREDNISolone  sodium succinate (SOLU-MEDROL) 125 mg/2 mL injection 80 mg (80 mg Intravenous Given 07/03/17 2236)  sodium chloride 0.9 % bolus 1,000 mL (1,000 mLs Intravenous New Bag/Given 07/03/17 2236)  iopamidol (ISOVUE-370) 76 % injection (100 mLs  Contrast Given 07/03/17 2336)     Initial Impression / Assessment and Plan / ED Course  I have reviewed the triage vital signs and the nursing notes.  Pertinent labs & imaging results that were available during my care of the patient were reviewed by me and considered in my medical decision making (see chart for details).  Clinical Course as of Jul 04 32  Thu Jul 03, 2017  2130 Repeat assessment of patient reveals diminished lung sounds, especially in the bilateral bases.  [SJ]  2300 Patient's lung sounds improved.  [SJ]  Fri Jul 04, 2017  0020 Patient states his breathing continues to improve. Global rhonchi with faint wheezing.  [SJ]    Clinical Course User Index [SJ] Osborn Pullin C, PA-C    Patient presents with chest pain and shortness of breath. Original working diagnoses included heart failure, COPD exacerbation, ACS, and PE. Heart failure less likely based on lack of pulmonary edema on x-ray and normal BNP. Patient did improve, however, with nitroglycerin as well as DuoNeb and Solu-Medrol.   End of shift patient care handoff report given to Avie Echevaria, PA-C. Plan: CT PE study pending. Admit if positive. If negative, evaluate pain and breathing status. Ambulate and record findings. Suspect patient may have to be admitted for either chest pain observation or COPD exacerbation.  Findings and plan of care discussed with Shirlyn Goltz, MD. Dr. Darl Householder personally evaluated and examined this patient.   Vitals:   07/03/17 1455 07/03/17 1458 07/03/17 1915  BP: 123/77  119/90  Pulse: (!) 102  (!) 101  Resp: 18  (!) 25  Temp: 98.6 F (37 C)    TempSrc: Oral    SpO2: 97%  96%  Weight:  89.8 kg (198 lb)   Height:  5\' 6"  (1.676 m)    Vitals:   07/03/17  2245 07/03/17 2300 07/03/17 2315 07/03/17 2330  BP: 108/69 96/74 100/75 103/72  Pulse: 95 85 87 85  Resp: (!) 23 (!) 21 (!)  21 (!) 23  Temp:      TempSrc:      SpO2: 95% 93% 98% 97%  Weight:      Height:         Final Clinical Impressions(s) / ED Diagnoses   Final diagnoses:  Shortness of breath    New Prescriptions New Prescriptions   No medications on file     Layla Maw 07/04/17 0121    Drenda Freeze, MD 08/06/17 1051

## 2017-07-03 NOTE — ED Triage Notes (Signed)
Onset 2 days ago developed chest pain seen at another hospital and patient did not want to be transported to [facility].  Pain continued currently 2/10 achy chest pain took 1 tab of nitro 30 minutes ago.

## 2017-07-03 NOTE — ED Notes (Signed)
Roger Flores made aware of need for blood redraw.

## 2017-07-03 NOTE — ED Notes (Signed)
Patient transported to CT 

## 2017-07-03 NOTE — ED Notes (Signed)
Though IV flushes well and pt denies any pain at site, there appears to be a knot forming at insertion site. CT states unable to use for scan. Ordering IV Team d/t difficult start and extensive scar tissue.

## 2017-07-03 NOTE — ED Notes (Signed)
IV Team at bedside 

## 2017-07-04 LAB — I-STAT TROPONIN, ED: TROPONIN I, POC: 0 ng/mL (ref 0.00–0.08)

## 2017-07-04 MED ORDER — HEPARIN (PORCINE) IN NACL 100-0.45 UNIT/ML-% IJ SOLN
1400.0000 [IU]/h | INTRAMUSCULAR | Status: DC
  Filled 2017-07-04: qty 250

## 2017-07-04 MED ORDER — IPRATROPIUM-ALBUTEROL 0.5-2.5 (3) MG/3ML IN SOLN
3.0000 mL | Freq: Once | RESPIRATORY_TRACT | Status: AC
  Administered 2017-07-04: 3 mL via RESPIRATORY_TRACT
  Filled 2017-07-04: qty 3

## 2017-07-04 MED ORDER — APIXABAN 5 MG PO TABS: 5.0000 mg | ORAL_TABLET | Freq: Two times a day (BID) | ORAL | 0 refills | Status: AC

## 2017-07-04 MED ORDER — HEPARIN BOLUS VIA INFUSION
5000.0000 [IU] | Freq: Once | INTRAVENOUS | Status: DC
  Filled 2017-07-04: qty 5000

## 2017-07-04 NOTE — Progress Notes (Signed)
ANTICOAGULATION CONSULT NOTE - Initial Consult  Pharmacy Consult for heparin Indication: r/o pulmonary vein thrombosis  No Known Allergies  Patient Measurements: Height: 5\' 6"  (167.6 cm) Weight: 198 lb (89.8 kg) IBW/kg (Calculated) : 63.8 Heparin Dosing Weight: 85kg  Vital Signs: Temp: 98.6 F (37 C) (08/09 1455) Temp Source: Oral (08/09 1455) BP: 127/85 (08/10 0200) Pulse Rate: 86 (08/10 0115)  Labs:  Recent Labs  07/01/17 1315 07/01/17 1345 07/01/17 1453 07/03/17 1635 07/03/17 1930  HGB 18.4* 19.7*  --  18.6*  --   HCT 52.9* 58.0*  --  54.1*  --   PLT 330  --   --  349  --   APTT  --   --  30  --   --   LABPROT  --   --  13.6  --   --   INR  --   --  1.04  --   --   CREATININE 0.84 0.70  --   --  0.79  TROPONINI <0.03  --   --   --   --     Estimated Creatinine Clearance: 112.1 mL/min (by C-G formula based on SCr of 0.79 mg/dL).   Medical History: Past Medical History:  Diagnosis Date  . Asthma   . Cancer ([place])    Larynx  . COPD (chronic obstructive pulmonary disease) ([place])   . Diabetes mellitus without complication ([place])   . Hypertension   . Kidney disease   . Liver disease   . Lung mass   . Stroke [facility])     Assessment: 56yo male c/o CP x2d, CT shows filling defects within inferior pulmonary veins >> suspicious for clot, to begin heparin.  Goal of Therapy:  Heparin level 0.3-0.7 units/ml Monitor platelets by anticoagulation protocol: Yes   Plan:  Will give heparin 5000 units x1 followed by gtt at 1400 units/hr and monitor heparin levels and CBC.  Wynona Neat, PharmD, BCPS  07/04/2017,2:28 AM

## 2017-07-04 NOTE — ED Notes (Signed)
Pt states that he wants to head home now. Informed MD & PA.   Pt given sandwich meal and drink, per request.

## 2017-07-04 NOTE — ED Notes (Signed)
Pt departed in NAD, refused use of wheelchair. Left AMA.

## 2017-07-04 NOTE — ED Notes (Signed)
ED Provider at bedside. 

## 2017-07-04 NOTE — ED Provider Notes (Signed)
Patient found to have pulmonary vein filling defect.  This could represent pulmonary vein clot.  I think the patient should be initiated on heparin and admitted the hospital.  I long discussion with the patient and the patient is not interested in being admitted the hospital.  He would prefer to follow-up as an outpatient.  We had a long discussion regarding the risks of this including worsening breathing and chest pain including abnormal heart rhythms and death.  He understands all this despite all this is still willing to go home.  I will have him discontinue Plavix and start the patient on Eliquis.  I think he'll benefit from outpatient echocardiogram and pulmonary follow-up.  These referral numbers will be given to the patient  Patient understands she is always welcome to return to the ER for any new or worsening symptoms or if he changes his mind and would be agreeable to admission   Jola Schmidt, MD 07/04/17 [phone]

## 2017-07-18 ENCOUNTER — Emergency Department (HOSPITAL_COMMUNITY): Payer: Medicare Other

## 2017-07-18 ENCOUNTER — Encounter (HOSPITAL_COMMUNITY): Payer: Self-pay

## 2017-07-18 ENCOUNTER — Observation Stay (HOSPITAL_COMMUNITY)
Admission: EM | Admit: 2017-07-18 | Discharge: 2017-07-20 | Disposition: A | Discharge status: Home / Self Care | Payer: Medicare Other | Attending: Internal Medicine | Admitting: Internal Medicine

## 2017-07-18 DIAGNOSIS — Z794 Long term (current) use of insulin: Secondary | ICD-10-CM | POA: Diagnosis not present

## 2017-07-18 DIAGNOSIS — F1123 Opioid dependence with withdrawal: Secondary | ICD-10-CM

## 2017-07-18 DIAGNOSIS — R079 Chest pain, unspecified: Principal | ICD-10-CM | POA: Diagnosis present

## 2017-07-18 DIAGNOSIS — J45909 Unspecified asthma, uncomplicated: Secondary | ICD-10-CM | POA: Diagnosis not present

## 2017-07-18 DIAGNOSIS — Z79899 Other long term (current) drug therapy: Secondary | ICD-10-CM | POA: Insufficient documentation

## 2017-07-18 DIAGNOSIS — N189 Chronic kidney disease, unspecified: Secondary | ICD-10-CM | POA: Insufficient documentation

## 2017-07-18 DIAGNOSIS — F1193 Opioid use, unspecified with withdrawal: Secondary | ICD-10-CM | POA: Insufficient documentation

## 2017-07-18 DIAGNOSIS — R0602 Shortness of breath: Secondary | ICD-10-CM | POA: Insufficient documentation

## 2017-07-18 DIAGNOSIS — E119 Type 2 diabetes mellitus without complications: Secondary | ICD-10-CM | POA: Diagnosis not present

## 2017-07-18 DIAGNOSIS — J449 Chronic obstructive pulmonary disease, unspecified: Secondary | ICD-10-CM | POA: Diagnosis not present

## 2017-07-18 DIAGNOSIS — L8990 Pressure ulcer of unspecified site, unspecified stage: Secondary | ICD-10-CM | POA: Diagnosis not present

## 2017-07-18 DIAGNOSIS — F419 Anxiety disorder, unspecified: Secondary | ICD-10-CM | POA: Insufficient documentation

## 2017-07-18 DIAGNOSIS — Z7901 Long term (current) use of anticoagulants: Secondary | ICD-10-CM | POA: Diagnosis not present

## 2017-07-18 DIAGNOSIS — J441 Chronic obstructive pulmonary disease with (acute) exacerbation: Secondary | ICD-10-CM | POA: Diagnosis not present

## 2017-07-18 DIAGNOSIS — C329 Malignant neoplasm of larynx, unspecified: Secondary | ICD-10-CM | POA: Diagnosis not present

## 2017-07-18 DIAGNOSIS — I129 Hypertensive chronic kidney disease with stage 1 through stage 4 chronic kidney disease, or unspecified chronic kidney disease: Secondary | ICD-10-CM | POA: Diagnosis not present

## 2017-07-18 DIAGNOSIS — F17210 Nicotine dependence, cigarettes, uncomplicated: Secondary | ICD-10-CM | POA: Insufficient documentation

## 2017-07-18 LAB — CBC WITH DIFFERENTIAL/PLATELET
BASOS ABS: 0 10*3/uL (ref 0.0–0.1)
Basophils Relative: 0 %
Eosinophils Absolute: 0 10*3/uL (ref 0.0–0.7)
Eosinophils Relative: 0 %
HEMATOCRIT: 50.2 % (ref 39.0–52.0)
HEMOGLOBIN: 17.3 g/dL — AB (ref 13.0–17.0)
LYMPHS PCT: 22 %
Lymphs Abs: 4 10*3/uL (ref 0.7–4.0)
MCH: 29 pg (ref 26.0–34.0)
MCHC: 34.5 g/dL (ref 30.0–36.0)
MCV: 84.1 fL (ref 78.0–100.0)
Monocytes Absolute: 1.9 10*3/uL — ABNORMAL HIGH (ref 0.1–1.0)
Monocytes Relative: 11 %
NEUTROS ABS: 12.3 10*3/uL — AB (ref 1.7–7.7)
NEUTROS PCT: 67 %
PLATELETS: 267 10*3/uL (ref 150–400)
RBC: 5.97 MIL/uL — AB (ref 4.22–5.81)
RDW: 14.4 % (ref 11.5–15.5)
WBC: 18.2 10*3/uL — AB (ref 4.0–10.5)

## 2017-07-18 LAB — BASIC METABOLIC PANEL
ANION GAP: 9 (ref 5–15)
BUN: 12 mg/dL (ref 6–20)
CHLORIDE: 102 mmol/L (ref 101–111)
CO2: 26 mmol/L (ref 22–32)
Calcium: 9.2 mg/dL (ref 8.9–10.3)
Creatinine, Ser: 0.77 mg/dL (ref 0.61–1.24)
GFR calc non Af Amer: >60 mL/min (ref >60)
GLUCOSE: 149 mg/dL — AB (ref 65–99)
Potassium: 3.9 mmol/L (ref 3.5–5.1)
Sodium: 137 mmol/L (ref 135–145)

## 2017-07-18 LAB — GLUCOSE, CAPILLARY: Glucose-Capillary: 231 mg/dL — ABNORMAL HIGH (ref 65–99)

## 2017-07-18 LAB — BRAIN NATRIURETIC PEPTIDE: B NATRIURETIC PEPTIDE 5: 64 pg/mL (ref 0.0–100.0)

## 2017-07-18 LAB — TROPONIN I: Troponin I: <0.03 ng/mL (ref <0.03)

## 2017-07-18 MED ORDER — METHYLPREDNISOLONE SODIUM SUCC 125 MG IJ SOLR
60.0000 mg | Freq: Four times a day (QID) | INTRAMUSCULAR | Status: DC
  Administered 2017-07-19 (×2): 60 mg via INTRAVENOUS
  Filled 2017-07-18 (×3): qty 2

## 2017-07-18 MED ORDER — IPRATROPIUM-ALBUTEROL 0.5-2.5 (3) MG/3ML IN SOLN: 3.0000 mL | Freq: Four times a day (QID) | RESPIRATORY_TRACT | Status: DC | PRN

## 2017-07-18 MED ORDER — MORPHINE SULFATE (PF) 4 MG/ML IV SOLN
4.0000 mg | INTRAVENOUS | Status: AC | PRN
  Administered 2017-07-18 (×2): 4 mg via INTRAVENOUS
  Filled 2017-07-18 (×2): qty 1

## 2017-07-18 MED ORDER — METHYLPREDNISOLONE SODIUM SUCC 125 MG IJ SOLR
125.0000 mg | Freq: Once | INTRAMUSCULAR | Status: AC
  Administered 2017-07-18: 125 mg via INTRAVENOUS
  Filled 2017-07-18: qty 2

## 2017-07-18 MED ORDER — TAMSULOSIN HCL 0.4 MG PO CAPS
0.4000 mg | ORAL_CAPSULE | Freq: Every day | ORAL | Status: DC
  Administered 2017-07-19 – 2017-07-20 (×2): 0.4 mg via ORAL
  Filled 2017-07-18 (×2): qty 1

## 2017-07-18 MED ORDER — GLUCERNA SHAKE PO LIQD
237.0000 mL | Freq: Four times a day (QID) | ORAL | Status: DC
  Administered 2017-07-18 – 2017-07-20 (×6): 237 mL via ORAL

## 2017-07-18 MED ORDER — IPRATROPIUM-ALBUTEROL 0.5-2.5 (3) MG/3ML IN SOLN
3.0000 mL | Freq: Four times a day (QID) | RESPIRATORY_TRACT | Status: DC
Start: 2017-07-19 — End: 2017-07-19
  Administered 2017-07-19 (×4): 3 mL via RESPIRATORY_TRACT
  Filled 2017-07-18 (×4): qty 3

## 2017-07-18 MED ORDER — LORAZEPAM 0.5 MG PO TABS
0.5000 mg | ORAL_TABLET | ORAL | Status: DC | PRN
  Administered 2017-07-19 – 2017-07-20 (×5): 0.5 mg via ORAL
  Filled 2017-07-18 (×5): qty 1

## 2017-07-18 MED ORDER — NAPROXEN 250 MG PO TABS: 500.0000 mg | ORAL_TABLET | Freq: Two times a day (BID) | ORAL | Status: DC | PRN

## 2017-07-18 MED ORDER — HYDROXYZINE HCL 25 MG PO TABS
25.0000 mg | ORAL_TABLET | Freq: Four times a day (QID) | ORAL | Status: DC | PRN
  Administered 2017-07-20: 25 mg via ORAL
  Filled 2017-07-18: qty 1

## 2017-07-18 MED ORDER — IPRATROPIUM BROMIDE 0.02 % IN SOLN
1.0000 mg | Freq: Once | RESPIRATORY_TRACT | Status: AC
  Administered 2017-07-18: 1 mg via RESPIRATORY_TRACT
  Filled 2017-07-18: qty 5

## 2017-07-18 MED ORDER — LOPERAMIDE HCL 2 MG PO CAPS
2.0000 mg | ORAL_CAPSULE | ORAL | Status: DC | PRN
Start: 2017-07-18 — End: 2017-07-20

## 2017-07-18 MED ORDER — MORPHINE SULFATE ER 30 MG PO TBCR
30.0000 mg | EXTENDED_RELEASE_TABLET | Freq: Two times a day (BID) | ORAL | Status: DC
  Administered 2017-07-19 (×2): 30 mg via ORAL
  Filled 2017-07-18 (×2): qty 1

## 2017-07-18 MED ORDER — GABAPENTIN 300 MG PO CAPS
600.0000 mg | ORAL_CAPSULE | Freq: Three times a day (TID) | ORAL | Status: DC
  Administered 2017-07-19 – 2017-07-20 (×5): 600 mg via ORAL
  Filled 2017-07-18 (×5): qty 2

## 2017-07-18 MED ORDER — ALBUTEROL (5 MG/ML) CONTINUOUS INHALATION SOLN
10.0000 mg/h | INHALATION_SOLUTION | Freq: Once | RESPIRATORY_TRACT | Status: AC
  Administered 2017-07-18: 10 mg/h via RESPIRATORY_TRACT
  Filled 2017-07-18: qty 20

## 2017-07-18 MED ORDER — MORPHINE SULFATE (PF) 2 MG/ML IV SOLN
2.0000 mg | INTRAVENOUS | Status: DC | PRN
  Administered 2017-07-19 (×4): 2 mg via INTRAVENOUS
  Filled 2017-07-18 (×4): qty 1

## 2017-07-18 MED ORDER — DOCUSATE SODIUM 100 MG PO CAPS: 100.0000 mg | ORAL_CAPSULE | Freq: Two times a day (BID) | ORAL | Status: DC | PRN

## 2017-07-18 MED ORDER — ONDANSETRON HCL 4 MG/2ML IJ SOLN: 4.0000 mg | Freq: Four times a day (QID) | INTRAMUSCULAR | Status: DC | PRN

## 2017-07-18 MED ORDER — DICYCLOMINE HCL 20 MG PO TABS
20.0000 mg | ORAL_TABLET | Freq: Four times a day (QID) | ORAL | Status: DC | PRN
  Filled 2017-07-18: qty 1

## 2017-07-18 MED ORDER — METHOCARBAMOL 500 MG PO TABS
500.0000 mg | ORAL_TABLET | Freq: Three times a day (TID) | ORAL | Status: DC | PRN
Start: 2017-07-18 — End: 2017-07-20

## 2017-07-18 MED ORDER — INSULIN ASPART 100 UNIT/ML ~~LOC~~ SOLN
0.0000 [IU] | Freq: Three times a day (TID) | SUBCUTANEOUS | Status: DC
  Administered 2017-07-19: 2 [IU] via SUBCUTANEOUS
  Administered 2017-07-19: 3 [IU] via SUBCUTANEOUS

## 2017-07-18 MED ORDER — ACETAMINOPHEN 325 MG PO TABS: 650.0000 mg | ORAL_TABLET | ORAL | Status: DC | PRN

## 2017-07-18 MED ORDER — NITROGLYCERIN 0.4 MG SL SUBL
0.4000 mg | SUBLINGUAL_TABLET | SUBLINGUAL | Status: DC | PRN
  Administered 2017-07-18: 0.4 mg via SUBLINGUAL
  Filled 2017-07-18: qty 1

## 2017-07-18 MED ORDER — ASPIRIN 81 MG PO CHEW
324.0000 mg | CHEWABLE_TABLET | Freq: Once | ORAL | Status: AC
  Administered 2017-07-18: 324 mg via ORAL
  Filled 2017-07-18: qty 4

## 2017-07-18 MED ORDER — APIXABAN 5 MG PO TABS
5.0000 mg | ORAL_TABLET | Freq: Two times a day (BID) | ORAL | Status: DC
  Administered 2017-07-19 – 2017-07-20 (×4): 5 mg via ORAL
  Filled 2017-07-18 (×4): qty 1

## 2017-07-18 NOTE — ED Triage Notes (Signed)
Mid sternal chest pain, stabbing at times with radiation to left jaw

## 2017-07-18 NOTE — H&P (Addendum)
TR[facility] H&P    Patient Demographics:    Roger Flores, is a 56 y.o. male  MRN: [id number]  DOB - 06/02/1961  Admit Date - 07/18/2017  Referring MD/NP/PA: Dr. Thurnell Garbe  Outpatient Primary MD for the patient is Hayden Rasmussen, MD  Patient coming from: Home  Chief Complaint  Patient presents with  . Chest Pain      HPI:    Roger Flores  is a 56 y.o. male, With history of end-stage COPD on chronic home O2 3 L/m, diabetes mellitus, pulmonary vein filling defect started on  anticoagulation with eliquis on 07/04/2017, hypertension, pulmonary nodules who was enrolled  with home hospice for end-stage COPD, and has been getting high-dose MS Contin 60 mg 3 times a day, MSIR 30 mg 4 times when necessary. The patient says that he fired hospice, and ran out of morphine.  He started having chest pain with radiation to jaw, also abdominal pain, nausea and vomiting. Patient has end-stage COPD, has 120 pack years smoking history. Patient says that pain improved after receiving nitroglycerin in the ED. Cardiac enzymes were negative, EKG showed no acute changes.   Patient underwent cardiac cath on 05/29/2016 at [facility], which showed no significant coronary artery disease, mild pulmonary hypertension. Patient was recommended risk factor modification and daily aspirin.  He admits to having nausea, vomiting and diarrhea. Also complains of abdominal cramping. Denies dysuria.   Review of systems:      All other systems reviewed and are negative.   With Past History of the following :    Past Medical History:  Diagnosis Date  . Asthma   . Cancer ([place])    Larynx  . COPD (chronic obstructive pulmonary disease) ([place])   . Diabetes mellitus without complication ([place])   . Hypertension   . Kidney disease   . Liver disease   . Lung mass   . Stroke [facility])       Past Surgical History:  Procedure Laterality Date  . NECK  SURGERY        Social History:      Social History  Substance Use Topics  . Smoking status: Current Every Day Smoker    Packs/day: 2.00    Types: Cigarettes  . Smokeless tobacco: Never Used  . Alcohol use No       Family History :   Patient's younger brother died of MI    Home Medications:   Prior to Admission medications   Medication Sig Start Date End Date Taking? Authorizing Provider  apixaban (ELIQUIS) 5 MG TABS tablet Take 1 tablet (5 mg total) by mouth 2 (two) times daily. 07/04/17   Jola Schmidt, MD  docusate sodium (COLACE) 100 MG capsule Take 100 mg by mouth 2 (two) times daily as needed for constipation.    [provider]  feeding supplement, GLUCERNA SHAKE, (GLUCERNA SHAKE) LIQD Take 237 mLs by mouth 4 (four) times daily.    [provider]  furosemide (LASIX) 40 MG tablet Take 40 mg by mouth daily as needed for fluid.  [provider]  gabapentin (NEURONTIN) 600 MG tablet Take 600 mg by mouth 3 (three) times daily.    [provider]  insulin aspart (NOVOLOG) 100 UNIT/ML injection Inject 8-13 Units into the skin 4 (four) times daily. 8 units before each meal and 13 units at bedtime.    [provider]  LORazepam (ATIVAN) 0.5 MG tablet Take 0.5 mg by mouth every 4 (four) hours as needed for anxiety.    [provider]  morphine (MSIR) 30 MG tablet Take 30 mg by mouth every 4 (four) hours.     [provider]  nitroGLYCERIN (NITROSTAT) 0.4 MG SL tablet Place 1 tablet under the tongue daily as needed for chest pain. 05/23/17   [provider]  ondansetron (ZOFRAN) 4 MG tablet Take 1 tablet by mouth every 8 (eight) hours as needed for nausea/vomiting.    [provider]  tamsulosin (FLOMAX) 0.4 MG CAPS capsule Take 0.4 mg by mouth daily.    [provider]     Allergies:    No Known Allergies   Physical Exam:   Vitals  Blood pressure 126/72, pulse 84, temperature 98.1 F  (36.7 C), temperature source Oral, resp. rate (!) 26, height 5\' 6"  (1.676 m), weight 89.8 kg (198 lb), SpO2 95 %.  1.  General: Appears in no acute distress  2. Psychiatric:  Intact judgement and  insight, awake alert, oriented x 3.  3. Neurologic: No focal neurological deficits, all cranial nerves intact.Strength 5/5 all 4 extremities, sensation intact all 4 extremities, plantars down going.  4. Eyes :  anicteric sclerae, moist conjunctivae with no lid lag. PERRLA.  5. ENMT:  Oropharynx clear with moist mucous membranes and good dentition  6. Neck:  supple, no cervical lymphadenopathy appriciated, No thyromegaly  7. Respiratory : Normal respiratory effort, bilateral rhonchi  8. Cardiovascular : RRR, no gallops, rubs or murmurs, no leg edema  9. Gastrointestinal:  Positive bowel sounds, abdomen soft, non-tender to palpation,no hepatosplenomegaly, no rigidity or guarding       10. Skin:  No cyanosis, normal texture and turgor, no rash, lesions or ulcers  11.Musculoskeletal:  Good muscle tone,  joints appear normal , no effusions,  normal range of motion    Data Review:    CBC  Recent Labs Lab 07/18/17 2052  WBC 18.2*  HGB 17.3*  HCT 50.2  PLT 267  MCV 84.1  MCH 29.0  MCHC 34.5  RDW 14.4  LYMPHSABS 4.0  MONOABS 1.9*  EOSABS 0.0  BASOSABS 0.0   ------------------------------------------------------------------------------------------------------------------  Chemistries   Recent Labs Lab 07/18/17 2052  NA 137  K 3.9  CL 102  CO2 26  GLUCOSE 149*  BUN 12  CREATININE 0.77  CALCIUM 9.2   ------------------------------------------------------------------------------------------------------------------  ------------------------------------------------------------------------------------------------------------------ GFR: Estimated Creatinine Clearance: 110.8 mL/min (by C-G formula based on SCr of 0.77 mg/dL). Liver Function Tests: No results for  input(s): AST, ALT, ALKPHOS, BILITOT, PROT, ALBUMIN in the last 168 hours. No results for input(s): LIPASE, AMYLASE in the last 168 hours. No results for input(s): AMMONIA in the last 168 hours. Coagulation Profile: No results for input(s): INR, PROTIME in the last 168 hours. Cardiac Enzymes:  Recent Labs Lab 07/18/17 2052  TROPONINI <0.03   BNP (last 3 results) No results for input(s): PROBNP in the last 8760 hours. HbA1C: No results for input(s): HGBA1C in the last 72 hours. CBG: No results for input(s): GLUCAP in the last 168 hours. Lipid Profile: No results for input(s): CHOL,  HDL, LDLCALC, TRIG, CHOLHDL, LDLDIRECT in the last 72 hours. Thyroid Function Tests: No results for input(s): TSH, T4TOTAL, FREET4, T3FREE, THYROIDAB in the last 72 hours. Anemia Panel: No results for input(s): VITAMINB12, FOLATE, FERRITIN, TIBC, IRON, RETICCTPCT in the last 72 hours.  --------------------------------------------------------------------------------------------------------------- Urine analysis: No results found for: COLORURINE, APPEARANCEUR, LABSPEC, PHURINE, GLUCOSEU, HGBUR, BILIRUBINUR, KETONESUR, PROTEINUR, UROBILINOGEN, NITRITE, LEUKOCYTESUR    Imaging Results:    Dg Chest Port 1 View  Result Date: 07/18/2017 CLINICAL DATA:  Midsternal chest pain radiating to the left mandible. Shortness of breath. Chronic cough with hemoptysis. EXAM: PORTABLE CHEST 1 VIEW COMPARISON:  Chest CT dated 07/03/2017 and chest radiographs dated 07/03/2017. FINDINGS: Poor inspiration. Normal sized heart. Clear lungs with stable mild diffuse peribronchial thickening. Interval mildly elevated left hemidiaphragm. Unremarkable bones. IMPRESSION: Poor inspiration with interval mild elevation of the left hemidiaphragm and stable mild chronic bronchitic changes. Electronically Signed   By: Claudie Revering M.D.   On: 07/18/2017 22:10    My personal review of EKG: NSR, RBBB   Assessment & Plan:    Active  Problems:   Chest pain   COPD, very severe ([place])   1. Chest pain- pain has resolved at this time, patient has no significant history of CAD as seen on cath report from [facility] in July 2017. Cardiac enzymes are negative, EKG shows no acute changes. Will place under observation to rule out ACS. Check troponin every 6 hours 3. Morphine when necessary for pain. 2. Opioid withdrawal- patient has been on high-dose morphine at home which was provided by hospice. Patient fired hospice and ran out of morphine. At this time he is undergoing opioid withdrawal, he wants to change hospice to [facility] start him on low-dose MS Contin 30 mg every 12 hours, MSIR 7.5 mg every 4 hours when necessary. We'll start when necessary Bentyl, hydroxyzine, Robaxin. Patient needs to be enrolled in other hospice before discharge, as he would likely need high-dose morphine at home. 3. End-stage COPD- patient on chronic home O2, has been on hospice as above. Will start Solu-Medrol 60 mg IV every 6 hours, DuoNeb nebulizers every 6 hours. 4. Diabetes mellitus-patient is currently nothing by mouth due to vomiting, will start sliding scale insulin with NovoLog. Check CBG every 6 hours 5. Anxiety-continue lorazepam 0.5 mg by mouth every 4 hours when necessary.    DVT Prophylaxis-   Lovenox   AM Labs Ordered, also please review Full Orders  Family Communication: Admission, patients condition and plan of care including tests being ordered have been discussed with the patient And his wife at bedside who indicate understanding and agree with the plan and Code Status.  Code Status:  Full code  Admission status: Observation    Time spent in minutes : 60 minutes   Iria Jamerson S M.D on 07/18/2017 at 11:02 PM  Between 7am to 7pm - Pager - [phone]. After 7pm go to [email or web address] - password [facility]  [facility] - Office  [phone]

## 2017-07-18 NOTE — ED Provider Notes (Signed)
[facility] DEPT Provider Note   CSN: [id number] Arrival date & time: 07/18/17  2035     History   Chief Complaint Chief Complaint  Patient presents with  . Chest Pain    HPI Roger Flores is a 56 y.o. male.  HPI Pt was seen at 2045. Per pt, c/o gradual onset and persistence of multiple intermittent episodes of chest "pain" for the past several weeks. Describes the CP as "tightness," with radiation into his left jaw. Pt has hx of chronic SOB due to lung CA, and is on home O2. Pt has been evaluated in the ED 3 times previously for this pain, dx possible ACS, dx PE and rx Eliquis. Pt endorses compliance with Eliquis. Pt left AMA each visit. Pt states he "is no longer on Hospice" and "that was why I left AMA." States he "came to get admitted now." Pt also requesting "some morphine" for his chronic pain. Denies palpitations, no back pain, no abd pain, no N/V/D, no fevers.    Past Medical History:  Diagnosis Date  . Asthma   . 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 COPD (chronic obstructive pulmonary disease) ([place])   . Diabetes mellitus without complication ([place])   . Hypertension   . Kidney disease   . Liver disease   . Lung mass   . Stroke [facility])     Patient Active Problem List   Diagnosis Date Noted  . Unstable angina pectoris ([place]) 07/01/2017    Past Surgical History:  Procedure Laterality Date  . NECK SURGERY         Home Medications    Prior to Admission medications   Medication Sig Start Date End Date Taking? Authorizing Provider  apixaban (ELIQUIS) 5 MG TABS tablet Take 1 tablet (5 mg total) by mouth 2 (two) times daily. 07/04/17   Jola Schmidt, MD  docusate sodium (COLACE) 100 MG capsule Take 100 mg by mouth 2 (two) times daily as needed for constipation.    [provider]  feeding supplement, GLUCERNA SHAKE, (GLUCERNA SHAKE) LIQD Take 237 mLs by mouth 4 (four) times daily.    [provider]  furosemide (LASIX) 40 MG tablet Take 40 mg by mouth  daily as needed for fluid.    [provider]  gabapentin (NEURONTIN) 600 MG tablet Take 600 mg by mouth 3 (three) times daily.    [provider]  insulin aspart (NOVOLOG) 100 UNIT/ML injection Inject 8-13 Units into the skin 4 (four) times daily. 8 units before each meal and 13 units at bedtime.    [provider]  LORazepam (ATIVAN) 0.5 MG tablet Take 0.5 mg by mouth every 4 (four) hours as needed for anxiety.    [provider]  morphine (MSIR) 30 MG tablet Take 30 mg by mouth every 4 (four) hours.     [provider]  nitroGLYCERIN (NITROSTAT) 0.4 MG SL tablet Place 1 tablet under the tongue daily as needed for chest pain. 05/23/17   [provider]  ondansetron (ZOFRAN) 4 MG tablet Take 1 tablet by mouth every 8 (eight) hours as needed for nausea/vomiting.    [provider]  tamsulosin (FLOMAX) 0.4 MG CAPS capsule Take 0.4 mg by mouth daily.    [provider]    Family History No family history on file.  Social History Social History  Substance Use Topics  . Smoking status: Current Every Day Smoker    Packs/day: 2.00    Types: Cigarettes  .  Smokeless tobacco: Never Used  . Alcohol use No     Allergies   Patient has no known allergies.   Review of Systems Review of Systems ROS: Statement: All systems negative except as marked or noted in the HPI; Constitutional: Negative for fever and chills. ; ; Eyes: Negative for eye pain, redness and discharge. ; ; ENMT: Negative for ear pain, hoarseness, nasal congestion, sinus pressure and sore throat. ; ; Cardiovascular: +CP. Negative for palpitations, diaphoresis, dyspnea and peripheral edema. ; ; Respiratory: Negative for cough, wheezing and stridor. ; ; Gastrointestinal: Negative for nausea, vomiting, diarrhea, abdominal pain, blood in stool, hematemesis, jaundice and rectal bleeding. . ; ; Genitourinary: Negative for dysuria, flank pain and hematuria. ; ;  Musculoskeletal: Negative for back pain and neck pain. Negative for swelling and trauma.; ; Skin: Negative for pruritus, rash, abrasions, blisters, bruising and skin lesion.; ; Neuro: Negative for headache, lightheadedness and neck stiffness. Negative for weakness, altered level of consciousness, altered mental status, extremity weakness, paresthesias, involuntary movement, seizure and syncope.       Physical Exam Updated Vital Signs BP (!) 150/94 (BP Location: Left Arm)   Pulse (!) 101   Temp 98.1 F (36.7 C) (Oral)   Resp (!) 24   Ht 5\' 6"  (1.676 m)   Wt 89.8 kg (198 lb)   SpO2 99%   BMI 31.96 kg/m   Physical Exam 2050: Physical examination:  Nursing notes reviewed; Vital signs and O2 SAT reviewed;  Constitutional: Well developed, Well nourished, Well hydrated, In no acute distress; Head:  Normocephalic, atraumatic; Eyes: EOMI, PERRL, No scleral icterus; ENMT: Mouth and pharynx normal, Mucous membranes moist; Neck: Supple, Full range of motion, No lymphadenopathy; Cardiovascular: Tachycardic rate and rhythm, No gallop; Respiratory: Breath sounds diminished & equal bilaterally, insp/exp wheezes bilat. No audible wheezing. Speaking short sentences, sitting upright, Tachypneic; Chest: Nontender, Movement normal; Abdomen: Soft, Nontender, Nondistended, Normal bowel sounds; Genitourinary: No CVA tenderness; Extremities: Pulses normal, No tenderness, No edema, No calf edema or asymmetry.; Neuro: AA&Ox3, Major CN grossly intact.  Speech clear. No gross focal motor or sensory deficits in extremities.; Skin: Color normal, Warm, Dry.   ED Treatments / Results  Labs (all labs ordered are listed, but only abnormal results are displayed)   EKG  EKG Interpretation None       Radiology   Procedures Procedures (including critical care time)  Medications Ordered in ED Medications  morphine 4 MG/ML injection 4 mg (4 mg Intravenous Given 07/18/17 2112)  albuterol (PROVENTIL,VENTOLIN)  solution continuous neb (10 mg/hr Nebulization Given 07/18/17 2100)  ipratropium (ATROVENT) nebulizer solution 1 mg (1 mg Nebulization Given 07/18/17 2100)  methylPREDNISolone sodium succinate (SOLU-MEDROL) 125 mg/2 mL injection 125 mg (125 mg Intravenous Given 07/18/17 2109)  aspirin chewable tablet 324 mg (324 mg Oral Given 07/18/17 2110)     Initial Impression / Assessment and Plan / ED Course  I have reviewed the triage vital signs and the nursing notes.  Pertinent labs & imaging results that were available during my care of the patient were reviewed by me and considered in my medical decision making (see chart for details).  MDM Reviewed: nursing note, previous chart and vitals Reviewed previous: labs, ECG and CT scan Interpretation: labs, ECG and x-ray Total time providing critical care: 30-74 minutes. This excludes time spent performing separately reportable procedures and services. Consults: admitting MD   CRITICAL CARE Performed by: Alfonzo Feller Total critical care time: 35 minutes Critical care time was exclusive of separately  billable procedures and treating other patients. Critical care was necessary to treat or prevent imminent or life-threatening deterioration. Critical care was time spent personally by me on the following activities: development of treatment plan with patient and/or surrogate as well as nursing, discussions with consultants, evaluation of patient's response to treatment, examination of patient, obtaining history from patient or surrogate, ordering and performing treatments and interventions, ordering and review of laboratory studies, ordering and review of radiographic studies, pulse oximetry and re-evaluation of patient's condition.    ED ECG REPORT   Date: 07/18/2017  Rate: 94  Rhythm: normal sinus rhythm  QRS Axis: normal  Intervals: normal  ST/T Wave abnormalities: normal  Conduction Disutrbances:right bundle branch block  Narrative  Interpretation:   Old EKG Reviewed: unchanged; no significant changes from previous EKG dated 07/01/2017 and 07/03/2017.  I have personally reviewed the EKG tracing and disagree with the computerized printout as noted.   Results for orders placed or performed during the hospital encounter of 34/19/37  Basic metabolic panel  Result Value Ref Range   Sodium 137 135 - 145 mmol/L   Potassium 3.9 3.5 - 5.1 mmol/L   Chloride 102 101 - 111 mmol/L   CO2 26 22 - 32 mmol/L   Glucose, Bld 149 (H) 65 - 99 mg/dL   BUN 12 6 - 20 mg/dL   Creatinine, Ser 0.77 0.61 - 1.24 mg/dL   Calcium 9.2 8.9 - 10.3 mg/dL   GFR calc non Af Amer >60 >60 mL/min   GFR calc Af Amer >60 >60 mL/min   Anion gap 9 5 - 15  Troponin I  Result Value Ref Range   Troponin I <0.03 <0.03 ng/mL  Brain natriuretic peptide  Result Value Ref Range   B Natriuretic Peptide 64.0 0.0 - 100.0 pg/mL  CBC with Differential  Result Value Ref Range   WBC 18.2 (H) 4.0 - 10.5 K/uL   RBC 5.97 (H) 4.22 - 5.81 MIL/uL   Hemoglobin 17.3 (H) 13.0 - 17.0 g/dL   HCT 50.2 39.0 - 52.0 %   MCV 84.1 78.0 - 100.0 fL   MCH 29.0 26.0 - 34.0 pg   MCHC 34.5 30.0 - 36.0 g/dL   RDW 14.4 11.5 - 15.5 %   Platelets 267 150 - 400 K/uL   Neutrophils Relative % 67 %   Neutro Abs 12.3 (H) 1.7 - 7.7 K/uL   Lymphocytes Relative 22 %   Lymphs Abs 4.0 0.7 - 4.0 K/uL   Monocytes Relative 11 %   Monocytes Absolute 1.9 (H) 0.1 - 1.0 K/uL   Eosinophils Relative 0 %   Eosinophils Absolute 0.0 0.0 - 0.7 K/uL   Basophils Relative 0 %   Basophils Absolute 0.0 0.0 - 0.1 K/uL   Ct Angio Chest Pe W And/or Wo Contrast Result Date: 07/04/2017 CLINICAL DATA:  Chest pain for 2 days with dyspnea. History of COPD and lung nodule. EXAM: CT ANGIOGRAPHY CHEST WITH CONTRAST TECHNIQUE: Multidetector CT imaging of the chest was performed using the standard protocol during bolus administration of intravenous contrast. Multiplanar CT image reconstructions and MIPs were obtained to  evaluate the vascular anatomy. CONTRAST:  100 cc Isovue 370 IV COMPARISON:  07/03/2017 CXR FINDINGS: Cardiovascular: Satisfactory opacification of the pulmonary arteries to the segmental level. No evidence of pulmonary embolism. Normal heart size. No pericardial effusion. Cleft like filling defects are seen within the inferior pulmonary veins bilaterally left greater than right. Mediastinum/Nodes: No enlarged mediastinal, hilar, or axillary lymph nodes. Thyroid gland, trachea,  and esophagus demonstrate no significant findings. Lungs/Pleura: Bilateral centrilobular emphysema. Bibasilar subsegmental atelectasis and dependent atelectasis. The following pulmonary nodular densities are seen: 1. 5.2 mm in average pulmonary nodule in the right upper lobe series 6, image 39. 2. 4.8 mm in average left upper lobe nodule, series 6, image 47. 3. 3 mm anterior left upper lobe nodule series 6, image 57. Upper Abdomen: No acute abnormality. Musculoskeletal: No chest wall abnormality. No acute or significant osseous findings. Review of the MIP images confirms the above findings. IMPRESSION: 1. No acute pulmonary arterial embolus. 2. Filling defects are seen within the inferior pulmonary veins bilaterally suspicious for clot, albeit uncommon. This can be seen in the setting of pulmonary neoplasm, mitral stenosis with left atrial clot, hypercoagulable state or idiopathic among some possibilities. 3. Bilateral small subcentimeter pulmonary nodules are seen the largest 5.2 mm in average in the right upper lobe. Non-contrast chest CT at 3-6 months is recommended. If the nodules are stable at time of repeat CT, then future CT at 18-24 months (from today's scan) is considered optional for low-risk patients, but is recommended for high-risk patients. This recommendation follows the consensus statement: Guidelines for Management of Incidental Pulmonary Nodules Detected on CT Images: From the Fleischner Society 2017; Radiology 2017;  284:228-243. 4. Bilateral subsegmental atelectasis in each lung base. 5. Centrilobular emphysema and aortic atherosclerosis. Aortic Atherosclerosis (ICD10-I70.0) and Emphysema (ICD10-J43.9). Electronically Signed   By: Ashley Royalty M.D.   On: 07/04/2017 01:36   Dg Chest Port 1 View Result Date: 07/18/2017 CLINICAL DATA:  Midsternal chest pain radiating to the left mandible. Shortness of breath. Chronic cough with hemoptysis. EXAM: PORTABLE CHEST 1 VIEW COMPARISON:  Chest CT dated 07/03/2017 and chest radiographs dated 07/03/2017. FINDINGS: Poor inspiration. Normal sized heart. Clear lungs with stable mild diffuse peribronchial thickening. Interval mildly elevated left hemidiaphragm. Unremarkable bones. IMPRESSION: Poor inspiration with interval mild elevation of the left hemidiaphragm and stable mild chronic bronchitic changes. Electronically Signed   By: Claudie Revering M.D.   On: 07/18/2017 22:10     2210:  Improved after ASA, SL ntg and IV morphine.  Pt wheezing on arrival with tachycardia, tachypnea: IV solumedrol and hour long neb started. Pt is now agreeable to be admitted for further workup.  T/C to [facility] Dr. Darrick Meigs, case discussed, including:  HPI, pertinent PM/SHx, VS/PE, dx testing, ED course and treatment:  Agreeable to admit, will come to the ED for evaluation.       Final Clinical Impressions(s) / ED Diagnoses   Final diagnoses:  None    New Prescriptions New Prescriptions   No medications on file     Francine Graven, DO 07/19/17 2338

## 2017-07-19 ENCOUNTER — Other Ambulatory Visit: Payer: Self-pay

## 2017-07-19 ENCOUNTER — Encounter (HOSPITAL_COMMUNITY): Payer: Self-pay | Admitting: *Deleted

## 2017-07-19 DIAGNOSIS — L8990 Pressure ulcer of unspecified site, unspecified stage: Secondary | ICD-10-CM | POA: Insufficient documentation

## 2017-07-19 DIAGNOSIS — J449 Chronic obstructive pulmonary disease, unspecified: Secondary | ICD-10-CM | POA: Diagnosis not present

## 2017-07-19 DIAGNOSIS — R079 Chest pain, unspecified: Secondary | ICD-10-CM | POA: Diagnosis not present

## 2017-07-19 LAB — GLUCOSE, CAPILLARY
GLUCOSE-CAPILLARY: 161 mg/dL — AB (ref 65–99)
GLUCOSE-CAPILLARY: 208 mg/dL — AB (ref 65–99)

## 2017-07-19 LAB — URINALYSIS, ROUTINE W REFLEX MICROSCOPIC
Bacteria, UA: NONE SEEN
Bilirubin Urine: NEGATIVE
Glucose, UA: >=500 mg/dL — AB
Ketones, ur: NEGATIVE mg/dL
Leukocytes, UA: NEGATIVE
Nitrite: NEGATIVE
PH: 5 (ref 5.0–8.0)
Protein, ur: 100 mg/dL — AB
Specific Gravity, Urine: 1.032 — ABNORMAL HIGH (ref 1.005–1.030)

## 2017-07-19 LAB — HEMOGLOBIN A1C
HEMOGLOBIN A1C: 6.3 % — AB (ref 4.8–5.6)
MEAN PLASMA GLUCOSE: 134.11 mg/dL

## 2017-07-19 LAB — TROPONIN I: Troponin I: <0.03 ng/mL (ref <0.03)

## 2017-07-19 MED ORDER — DICYCLOMINE HCL 10 MG PO CAPS: 20.0000 mg | ORAL_CAPSULE | Freq: Four times a day (QID) | ORAL | Status: DC | PRN

## 2017-07-19 MED ORDER — NITROGLYCERIN 0.4 MG SL SUBL
SUBLINGUAL_TABLET | SUBLINGUAL | Status: AC
  Filled 2017-07-19: qty 1

## 2017-07-19 MED ORDER — MORPHINE SULFATE 15 MG PO TABS
15.0000 mg | ORAL_TABLET | ORAL | Status: DC | PRN
  Administered 2017-07-19 – 2017-07-20 (×6): 15 mg via ORAL
  Filled 2017-07-19 (×6): qty 1

## 2017-07-19 MED ORDER — ALBUTEROL SULFATE (2.5 MG/3ML) 0.083% IN NEBU: 2.5000 mg | INHALATION_SOLUTION | RESPIRATORY_TRACT | Status: DC | PRN

## 2017-07-19 MED ORDER — NICOTINE 14 MG/24HR TD PT24
14.0000 mg | MEDICATED_PATCH | Freq: Every day | TRANSDERMAL | Status: DC
  Administered 2017-07-19 – 2017-07-20 (×3): 14 mg via TRANSDERMAL
  Filled 2017-07-19 (×3): qty 1

## 2017-07-19 MED ORDER — IPRATROPIUM-ALBUTEROL 0.5-2.5 (3) MG/3ML IN SOLN
3.0000 mL | Freq: Four times a day (QID) | RESPIRATORY_TRACT | Status: DC
  Administered 2017-07-20 (×2): 3 mL via RESPIRATORY_TRACT
  Filled 2017-07-19 (×3): qty 3

## 2017-07-19 MED ORDER — ALBUTEROL SULFATE (2.5 MG/3ML) 0.083% IN NEBU
INHALATION_SOLUTION | RESPIRATORY_TRACT | Status: AC
  Administered 2017-07-19: 2.5 mg
  Filled 2017-07-19: qty 3

## 2017-07-19 MED ORDER — PREDNISONE 20 MG PO TABS
40.0000 mg | ORAL_TABLET | Freq: Every day | ORAL | Status: DC
  Administered 2017-07-20: 40 mg via ORAL
  Filled 2017-07-19: qty 2

## 2017-07-19 MED ORDER — SODIUM CHLORIDE 0.9 % IV BOLUS (SEPSIS): 500.0000 mL | Freq: Once | INTRAVENOUS | Status: DC

## 2017-07-19 MED ORDER — MORPHINE SULFATE ER 30 MG PO TBCR
60.0000 mg | EXTENDED_RELEASE_TABLET | Freq: Two times a day (BID) | ORAL | Status: DC
  Administered 2017-07-19 – 2017-07-20 (×2): 60 mg via ORAL
  Filled 2017-07-19 (×2): qty 2

## 2017-07-19 NOTE — Progress Notes (Signed)
Pt encouraged on safety measures.  Attempted to engage bed alarm but pt refused. Pt stated "I fall all the time at home" "I'm fine" " I don't want no bed alarm".

## 2017-07-19 NOTE — Progress Notes (Signed)
Just called and spoke with Pamala Hurry from [facility] regarding patient at MD's request. We are waiting to see if they will accept him as a patient before he is discharged. Pamala Hurry informed me that she will page the on call nurse at hospice and will instruct her to give me a call back. Awaiting call.

## 2017-07-19 NOTE — Progress Notes (Signed)
Patient had a fall during shift change per report.  Introduced myself and the nurse resident to patient and family. Told the patient that I could not let him get up by himself due to the fall.  Patient stated that he falls all the time at home and that he didn't need help.  Patient said that he would be going to the bathroom by himself.  I told the patient that he could have privacy in the bathroom but that one of the staff needed to be in the room with him to prevent further injury.  Alerted the [facility] of the conversation. Will continue to monitor.

## 2017-07-19 NOTE — Progress Notes (Signed)
Pt urged to remain in bed and call for assistance with ambulation. Pt refused bed alarm. Pt noncompliant with safety and fall prevention measures.

## 2017-07-19 NOTE — Progress Notes (Signed)
Spoke with hospice nurse regarding patient. Hospice nurse has informed me they will not be able to accept the patient in this setting due to his condition. The patient is not actively dieing and does not need to go to the hospice house, and is wanting to be admitted with chronic end stage COPD, which is the diagnosis he just had with his last hospice care (he received care from them for 5 years). I reported this to the MD, he verbalized understanding and has agreed to inform the patient. The hospice nurse did say that he could possibly go to his primary care doctor and get his lung nodules biopsied and see if there is another route or diagnosis he can be treated by hospice for.

## 2017-07-19 NOTE — Progress Notes (Signed)
Patient ambulating in the hall without staff assistance.  Explained to patient that due to him falling during shift change that he should not be in the hallways ambulating without assistance.  Patient still not compliant stating that his wife was helping.  Will continue to monitor.

## 2017-07-19 NOTE — Progress Notes (Signed)
Patient in the shower. After advising him against it.

## 2017-07-19 NOTE — Progress Notes (Signed)
Patients wife came to desk.  Other staff asking if they could help her and she ignored them.  I finished my phone conversation with the MD and asked the patients wife if I could help her.  She stated that the patient was having chest pain and was asking sublingual nitro.  I told her I would be right there and I pulled nitroglycerine.  Entered the room with the tech.  Called respiratory to get a stat EKG and vital signs were taken and nitro was given.  Told the patient and the family member what was being done to help him.  The patient then started complaining of numbness to his face and that he felt like his face was drooping.  No noted drooping to his face,  he was moving all extremities with no weakness, and his speech was not slurred at all.  Told the patient that I would call the MD to let him know that he was having numbness and that Robert from respiratory was here to get his EKG.  After leaving the room the patient called to th desk to ask to speak to the charge nurse.  Since I currently was the charge nurse I sent the [facility] to speak with the patient while I paged the MD about his numbness.  The [facility] left the room and told me to trade this patient with another nurse.  Gave report to [facility].

## 2017-07-19 NOTE — Progress Notes (Signed)
Pt repeatedly educated on safety and fall prevention to include initiation of bed alarm, to call for assistance with getting OOB, using assistive device with ambulation.  Pt stated "I fall all the time at home", "I'm going to the bathroom by myself".  Pt refused bed alarm despite frequent attempts. Pt became agitated.

## 2017-07-19 NOTE — Progress Notes (Signed)
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 He ruled out for ACS with negative cardiac markers. Patient was being followed by hospice at home until recently when their services was discontinued. Plans are to reestablish with a community hospice prior to discharge home.   Assessment & Plan:   Active Problems:   Chest pain   COPD, very severe (HCC)   Pressure injury of skin   1. Chest pain. Ruled out for ACS with negative cardiac markers. No acute EKG changes. Symptoms are atypical. No further workup planned. 2. End-stage COPD . patient reports that he has severe COPD. he is oxygen dependent. At baseline, is unable to ambulate minimal distances without becoming severely short of breath. He has been followed by hospice in the past and wishes to continue with their services. He uses morphine for dyspnea. Currently on Solu-Medrol, will transition and prednisone. Continue bronchodilators. 3. History pulmonary vein thrombosis. Currently on anticoagulation with eliquis 4. Diabetes. Continue sliding scale insulin 5. Anxiety. Currently on lorazepam. Stable 6. Opiate withdrawal. Patient was having symptoms of opiate withdrawal when he ran out of morphine at home. He recently stopped services with hospice and ran out of his opiate medications. He's been started on supportive treatment. Restarted on his home dose of opiates. Would benefit from being set up with hospice again. We'll ask case management to arrange prior to discharge. Staff discussed this case with [facility] hospice who did not feel that they will be able to take him on his patient. We'll need to discuss with hospice services.   DVT prophylaxis: eliquis Code Status: DO NOT  RESUSCITATE Family Communication: Discussed with wife at the bedside Disposition Plan: Discharge home once appropriate resources have been arranged, i.e. hospice   Consultants:     Procedures:     Antimicrobials:      Subjective: Complains of intermittent chest pain. Feels short of breath and wheezing.  Objective: Vitals:   07/19/17 0026 07/19/17 0609 07/19/17 1310 07/19/17 1444  BP:  133/73 121/71   Pulse:  91 (!) 112   Resp:  (!) 24 (!) 21   Temp:  98.1 F (36.7 C) 98.2 F (36.8 C)   TempSrc:  Oral Oral   SpO2: 96% 100% 97% 96%  Weight:      Height:        Intake/Output Summary (Last 24 hours) at 07/19/17 1822 Last data filed at 07/19/17 1500  Gross per 24 hour  Intake              240 ml  Output              450 ml  Net             -210 ml   Filed Weights   07/18/17 2042  Weight: 89.8 kg (198 lb)    Examination:  General exam: Appears calm and comfortable  Respiratory system: Diminished breath sounds bilaterally. Respiratory effort normal. Cardiovascular system: S1 & S2 heard, RRR. No JVD, murmurs, rubs, gallops or clicks. No pedal edema. Gastrointestinal system: Abdomen is nondistended, soft and nontender. No organomegaly or masses felt. Normal bowel sounds heard. Central nervous system: Alert and oriented. No focal neurological deficits. Extremities: Symmetric 5 x 5 power. Skin: No rashes, lesions or ulcers Psychiatry: Judgement  and insight appear normal. Mood & affect appropriate.     Data Reviewed: I have personally reviewed following labs and imaging studies  CBC:  Recent Labs Lab 07/18/17 2052  WBC 18.2*  NEUTROABS 12.3*  HGB 17.3*  HCT 50.2  MCV 84.1  PLT 294   Basic Metabolic Panel:  Recent Labs Lab 07/18/17 2052  NA 137  K 3.9  CL 102  CO2 26  GLUCOSE 149*  BUN 12  CREATININE 0.77  CALCIUM 9.2   GFR: Estimated Creatinine Clearance: 110.8 mL/min (by C-G formula based on SCr of 0.77 mg/dL). Liver Function Tests: No  results for input(s): AST, ALT, ALKPHOS, BILITOT, PROT, ALBUMIN in the last 168 hours. No results for input(s): LIPASE, AMYLASE in the last 168 hours. No results for input(s): AMMONIA in the last 168 hours. Coagulation Profile: No results for input(s): INR, PROTIME in the last 168 hours. Cardiac Enzymes:  Recent Labs Lab 07/18/17 2052 07/18/17 2356 07/19/17 0622 07/19/17 1157  TROPONINI <0.03 <0.03 <0.03 <0.03   BNP (last 3 results) No results for input(s): PROBNP in the last 8760 hours. HbA1C:  Recent Labs  07/18/17 2356  HGBA1C 6.3*   CBG:  Recent Labs Lab 07/18/17 2351 07/19/17 1125 07/19/17 1617  GLUCAP 231* 161* 208*   Lipid Profile: No results for input(s): CHOL, HDL, LDLCALC, TRIG, CHOLHDL, LDLDIRECT in the last 72 hours. Thyroid Function Tests: No results for input(s): TSH, T4TOTAL, FREET4, T3FREE, THYROIDAB in the last 72 hours. Anemia Panel: No results for input(s): VITAMINB12, FOLATE, FERRITIN, TIBC, IRON, RETICCTPCT in the last 72 hours. Sepsis Labs: No results for input(s): PROCALCITON, LATICACIDVEN in the last 168 hours.  No results found for this or any previous visit (from the past 240 hour(s)).       Radiology Studies: Dg Chest Port 1 View  Result Date: 07/18/2017 CLINICAL DATA:  Midsternal chest pain radiating to the left mandible. Shortness of breath. Chronic cough with hemoptysis. EXAM: PORTABLE CHEST 1 VIEW COMPARISON:  Chest CT dated 07/03/2017 and chest radiographs dated 07/03/2017. FINDINGS: Poor inspiration. Normal sized heart. Clear lungs with stable mild diffuse peribronchial thickening. Interval mildly elevated left hemidiaphragm. Unremarkable bones. IMPRESSION: Poor inspiration with interval mild elevation of the left hemidiaphragm and stable mild chronic bronchitic changes. Electronically Signed   By: Claudie Revering M.D.   On: 07/18/2017 22:10        Scheduled Meds: . apixaban  5 mg Oral BID  . feeding supplement (GLUCERNA SHAKE)   237 mL Oral QID  . gabapentin  600 mg Oral TID  . insulin aspart  0-9 Units Subcutaneous TID WC  . ipratropium-albuterol  3 mL Nebulization Q6H  . morphine  60 mg Oral Q12H  . nicotine  14 mg Transdermal Daily  . [START ON 07/20/2017] predniSONE  40 mg Oral Q breakfast  . tamsulosin  0.4 mg Oral Daily   Continuous Infusions:   LOS: 0 days    Time spent: 27mins    MEMON,JEHANZEB, MD [facility] Pager [phone]  If 7PM-7AM, please contact night-coverage [email or web address] Password [facility] 07/19/2017, 6:22 PM

## 2017-07-20 DIAGNOSIS — R079 Chest pain, unspecified: Secondary | ICD-10-CM | POA: Diagnosis not present

## 2017-07-20 DIAGNOSIS — J441 Chronic obstructive pulmonary disease with (acute) exacerbation: Secondary | ICD-10-CM | POA: Diagnosis not present

## 2017-07-20 LAB — GLUCOSE, CAPILLARY
GLUCOSE-CAPILLARY: 154 mg/dL — AB (ref 65–99)
GLUCOSE-CAPILLARY: 193 mg/dL — AB (ref 65–99)
GLUCOSE-CAPILLARY: 268 mg/dL — AB (ref 65–99)
Glucose-Capillary: 246 mg/dL — ABNORMAL HIGH (ref 65–99)
Glucose-Capillary: 154 mg/dL — ABNORMAL HIGH (ref 65–99)

## 2017-07-20 LAB — CBC
HEMATOCRIT: 47.7 % (ref 39.0–52.0)
HEMOGLOBIN: 15.7 g/dL (ref 13.0–17.0)
MCH: 28.4 pg (ref 26.0–34.0)
MCHC: 32.9 g/dL (ref 30.0–36.0)
MCV: 86.3 fL (ref 78.0–100.0)
Platelets: 248 10*3/uL (ref 150–400)
RBC: 5.53 MIL/uL (ref 4.22–5.81)
RDW: 14.8 % (ref 11.5–15.5)
WBC: 23.1 10*3/uL — AB (ref 4.0–10.5)

## 2017-07-20 LAB — BASIC METABOLIC PANEL
ANION GAP: 9 (ref 5–15)
BUN: 17 mg/dL (ref 6–20)
CALCIUM: 8.5 mg/dL — AB (ref 8.9–10.3)
CO2: 26 mmol/L (ref 22–32)
Chloride: 102 mmol/L (ref 101–111)
Creatinine, Ser: 0.77 mg/dL (ref 0.61–1.24)
GFR calc Af Amer: >60 mL/min (ref >60)
GFR calc non Af Amer: >60 mL/min (ref >60)
GLUCOSE: 198 mg/dL — AB (ref 65–99)
POTASSIUM: 3.8 mmol/L (ref 3.5–5.1)
Sodium: 137 mmol/L (ref 135–145)

## 2017-07-20 LAB — HIV ANTIBODY (ROUTINE TESTING W REFLEX): HIV Screen 4th Generation wRfx: NONREACTIVE

## 2017-07-20 MED ORDER — PREDNISONE 10 MG PO TABS: ORAL_TABLET | ORAL | 0 refills | Status: DC

## 2017-07-20 MED ORDER — ALBUTEROL SULFATE (2.5 MG/3ML) 0.083% IN NEBU: 2.5000 mg | INHALATION_SOLUTION | Freq: Four times a day (QID) | RESPIRATORY_TRACT | 12 refills | Status: AC | PRN

## 2017-07-20 MED ORDER — LORAZEPAM 1 MG PO TABS
2.0000 mg | ORAL_TABLET | Freq: Once | ORAL | Status: AC
  Administered 2017-07-20: 2 mg via ORAL
  Filled 2017-07-20: qty 2

## 2017-07-20 MED ORDER — MORPHINE SULFATE 15 MG PO TABS: 15.0000 mg | ORAL_TABLET | Freq: Four times a day (QID) | ORAL | 0 refills | Status: DC | PRN

## 2017-07-20 MED ORDER — GUAIFENESIN ER 600 MG PO TB12: 600.0000 mg | ORAL_TABLET | Freq: Two times a day (BID) | ORAL | 2 refills | Status: DC

## 2017-07-20 MED ORDER — MORPHINE SULFATE ER 60 MG PO TBCR: 60.0000 mg | EXTENDED_RELEASE_TABLET | Freq: Two times a day (BID) | ORAL | 0 refills | Status: DC

## 2017-07-20 MED ORDER — INSULIN ASPART 100 UNIT/ML ~~LOC~~ SOLN
0.0000 [IU] | Freq: Every day | SUBCUTANEOUS | Status: DC
  Administered 2017-07-20: 3 [IU] via SUBCUTANEOUS

## 2017-07-20 MED ORDER — INSULIN ASPART 100 UNIT/ML ~~LOC~~ SOLN
0.0000 [IU] | Freq: Three times a day (TID) | SUBCUTANEOUS | Status: DC
  Administered 2017-07-20 (×2): 2 [IU] via SUBCUTANEOUS

## 2017-07-20 NOTE — Progress Notes (Signed)
Discharge instructions gone over with patient and spouse, verbalized understanding. Printed prescriptions given to patient.

## 2017-07-20 NOTE — Progress Notes (Signed)
Received call back from manager of [facility] of [place], [place]. She informed me that at this time they are too short staffed to see patients out in the [place] area. I reported this to the MD.

## 2017-07-20 NOTE — Progress Notes (Signed)
Patient still being non-compliant with bed alarm and calling before getting out of bed.  Patient has been educated multiple times on importance of calling for assistance when getting out of bed.

## 2017-07-20 NOTE — Progress Notes (Signed)
Patient had wife to request for her to be able to push him around in a wheelchair around the unit. I consulted with the MD about his request, the doctor stated it would be okay as long as he stayed on the unit and did not get up out of the wheelchair. The nurse tech took a wheelchair to the patient and his wife and told him he was welcome to ride, and explained the conditions. The patient then said to her "If I can't go downstairs to the cafeteria I'm not going!" The nurse tech then left his room and reported this to me. We will continue to monitor.

## 2017-07-20 NOTE — Progress Notes (Signed)
[place], [place] called regarding patient. Was told by operator that a nurse would be calling me back with information about admitting or rejecting patient. Awaiting return phone call.

## 2017-07-20 NOTE — Discharge Summary (Signed)
Physician Discharge Summary  Roger Flores NFA:[id number] DOB: [birth year] DOA: 07/18/2017  PCP: Hayden Rasmussen, MD  Admit date: 07/18/2017 Discharge date: 07/20/2017  Admitted From: Home Disposition:  Home with hospice  Recommendations for Outpatient Follow-up:  1. Follow up with PCP in 1-2 weeks 2. Please obtain BMP/CBC in one week 3. 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 He ruled out for ACS with negative cardiac markers. The patient has been followed by [facility] until recently when her services were discontinued. He reports that his equipment was moved out of his home and he eventually ran out of his opiate medications. He reports discontinuing his services with hospice since at that time he was interested in pursuing further medical care. Currently, he wishes to pursue only hospice services. He agrees to not pursue further aggressive medical care. He initially wished to establish care with [facility]. [facility] was contacted, but were unwilling to take patient onto their service. Patient reports that he will plan to return to [facility] and resumed their services. He is feeling better and is anxious to discharge home. He is not having any chest pain. Shortness of breath has improved. He'll be continued on a prednisone taper and bronchodilators at home. He otherwise appears stable for discharge.  Discharge Diagnoses:  Active Problems:   Chest pain   COPD, very severe ([place])   Pressure injury of skin    Discharge Instructions  Discharge Instructions    Diet - low  sodium heart healthy    Complete by:  As directed    Increase activity slowly    Complete by:  As directed      Allergies as of 07/20/2017   No Known Allergies     Medication List    TAKE these medications   albuterol (2.5 MG/3ML) 0.083% nebulizer solution Commonly known as:  PROVENTIL Take 3 mLs (2.5 mg total) by nebulization every 6 (six) hours as needed for wheezing or shortness of breath.   apixaban 5 MG Tabs tablet Commonly known as:  ELIQUIS Take 1 tablet (5 mg total) by mouth 2 (two) times daily.   docusate sodium 100 MG capsule Commonly known as:  COLACE Take 100 mg by mouth 2 (two) times daily as needed for constipation.   feeding supplement (GLUCERNA SHAKE) Liqd Take 237 mLs by mouth 4 (four) times daily.   furosemide 40 MG tablet Commonly known as:  LASIX Take 40 mg by mouth daily as needed for fluid.   gabapentin 600 MG tablet Commonly known as:  NEURONTIN Take 600 mg by mouth 3 (three) times daily.   guaiFENesin 600 MG 12 hr tablet Commonly known as:  MUCINEX Take 1 tablet (600 mg total) by mouth 2 (two) times daily.   insulin aspart 100 UNIT/ML injection Commonly known as:  novoLOG Inject 8-13 Units into the skin 4 (four) times daily. 8 units before each meal and 13 units at bedtime.   LORazepam 0.5 MG tablet Commonly known as:  ATIVAN Take 0.5 mg by mouth every 4 (four) hours as needed for anxiety.   morphine 60 MG 12 hr tablet Commonly known as:  MS CONTIN Take 1 tablet (60 mg total) by mouth 2 (two)  times daily.   morphine 15 MG tablet Commonly known as:  MSIR Take 1 tablet (15 mg total) by mouth every 6 (six) hours as needed for severe pain.   nitroGLYCERIN 0.4 MG SL tablet Commonly known as:  NITROSTAT Place 1 tablet under the tongue daily as needed for chest pain.   ondansetron 4 MG tablet Commonly known as:  ZOFRAN Take 1 tablet by mouth every 8 (eight) hours as needed for nausea/vomiting.   predniSONE 10 MG tablet Commonly known  as:  DELTASONE Take 40mg  po daily for 2 days then 30mg  daily for 2 days then 20mg  daily for 2 days then 10mg  daily for 2 days then stop   prochlorperazine 5 MG tablet Commonly known as:  COMPAZINE Take 5 mg by mouth every 6 (six) hours as needed for nausea or vomiting.   tamsulosin 0.4 MG Caps capsule Commonly known as:  FLOMAX Take 0.4 mg by mouth daily.            Discharge Care Instructions        Start     Ordered   07/20/17 0000  morphine (MS CONTIN) 60 MG 12 hr tablet  2 times daily     07/20/17 1458   07/20/17 0000  morphine (MSIR) 15 MG tablet  Every 6 hours PRN     07/20/17 1458   07/20/17 0000  predniSONE (DELTASONE) 10 MG tablet     07/20/17 1458   07/20/17 0000  Increase activity slowly     07/20/17 1458   07/20/17 0000  Diet - low sodium heart healthy     07/20/17 1458   07/20/17 0000  albuterol (PROVENTIL) (2.5 MG/3ML) 0.083% nebulizer solution  Every 6 hours PRN     07/20/17 1458   07/20/17 0000  guaiFENesin (MUCINEX) 600 MG 12 hr tablet  2 times daily     07/20/17 1458      No Known Allergies  Consultations:     Procedures/Studies: Dg Chest 2 View  Result Date: 07/03/2017 CLINICAL DATA:  Chest pain EXAM: CHEST  2 VIEW COMPARISON:  July 01, 2017 FINDINGS: There is atelectatic change in the anterior left base. Lungs elsewhere are clear. Heart size and pulmonary vascularity are normal. No adenopathy. There is aortic atherosclerosis. No bone lesions. IMPRESSION: Atelectatic change anterior left base. Lungs elsewhere clear. Stable cardiac silhouette. There is aortic atherosclerosis. Aortic Atherosclerosis (ICD10-I70.0). Electronically Signed   By: Lowella Grip III M.D.   On: 07/03/2017 15:36   Ct Angio Chest Pe W And/or Wo Contrast  Result Date: 07/04/2017 CLINICAL DATA:  Chest pain for 2 days with dyspnea. History of COPD and lung nodule. EXAM: CT ANGIOGRAPHY CHEST WITH CONTRAST TECHNIQUE: Multidetector CT imaging of the chest was performed using  the standard protocol during bolus administration of intravenous contrast. Multiplanar CT image reconstructions and MIPs were obtained to evaluate the vascular anatomy. CONTRAST:  100 cc Isovue 370 IV COMPARISON:  07/03/2017 CXR FINDINGS: Cardiovascular: Satisfactory opacification of the pulmonary arteries to the segmental level. No evidence of pulmonary embolism. Normal heart size. No pericardial effusion. Cleft like filling defects are seen within the inferior pulmonary veins bilaterally left greater than right. Mediastinum/Nodes: No enlarged mediastinal, hilar, or axillary lymph nodes. Thyroid gland, trachea, and esophagus demonstrate no significant findings. Lungs/Pleura: Bilateral centrilobular emphysema. Bibasilar subsegmental atelectasis and dependent atelectasis. The following pulmonary nodular densities are seen: 1. 5.2 mm in average pulmonary nodule in the right upper lobe series 6, image 39. 2. 4.8 mm in  average left upper lobe nodule, series 6, image 47. 3. 3 mm anterior left upper lobe nodule series 6, image 57. Upper Abdomen: No acute abnormality. Musculoskeletal: No chest wall abnormality. No acute or significant osseous findings. Review of the MIP images confirms the above findings. IMPRESSION: 1. No acute pulmonary arterial embolus. 2. Filling defects are seen within the inferior pulmonary veins bilaterally suspicious for clot, albeit uncommon. This can be seen in the setting of pulmonary neoplasm, mitral stenosis with left atrial clot, hypercoagulable state or idiopathic among some possibilities. 3. Bilateral small subcentimeter pulmonary nodules are seen the largest 5.2 mm in average in the right upper lobe. Non-contrast chest CT at 3-6 months is recommended. If the nodules are stable at time of repeat CT, then future CT at 18-24 months (from today's scan) is considered optional for low-risk patients, but is recommended for high-risk patients. This recommendation follows the consensus statement:  Guidelines for Management of Incidental Pulmonary Nodules Detected on CT Images: From the Fleischner Society 2017; Radiology 2017; 284:228-243. 4. Bilateral subsegmental atelectasis in each lung base. 5. Centrilobular emphysema and aortic atherosclerosis. Aortic Atherosclerosis (ICD10-I70.0) and Emphysema (ICD10-J43.9). Electronically Signed   By: Ashley Royalty M.D.   On: 07/04/2017 01:36   Dg Chest Port 1 View  Result Date: 07/18/2017 CLINICAL DATA:  Midsternal chest pain radiating to the left mandible. Shortness of breath. Chronic cough with hemoptysis. EXAM: PORTABLE CHEST 1 VIEW COMPARISON:  Chest CT dated 07/03/2017 and chest radiographs dated 07/03/2017. FINDINGS: Poor inspiration. Normal sized heart. Clear lungs with stable mild diffuse peribronchial thickening. Interval mildly elevated left hemidiaphragm. Unremarkable bones. IMPRESSION: Poor inspiration with interval mild elevation of the left hemidiaphragm and stable mild chronic bronchitic changes. Electronically Signed   By: Claudie Revering M.D.   On: 07/18/2017 22:10   Dg Chest Port 1 View  Result Date: 07/01/2017 CLINICAL DATA:  Chest pain EXAM: PORTABLE CHEST 1 VIEW COMPARISON:  None. FINDINGS: There is no edema or consolidation. Heart size and pulmonary vascularity are normal. No adenopathy. There is atherosclerotic calcification aorta. No pneumothorax. No evident bone lesion. IMPRESSION: Aortic atherosclerosis.  No edema or consolidation. Aortic Atherosclerosis (ICD10-I70.0). Electronically Signed   By: Lowella Grip III M.D.   On: 07/01/2017 14:12       Subjective: Breathing is improved. No longer having any chest pain.  Discharge Exam: Vitals:   07/20/17 0804 07/20/17 1414  BP:    Pulse:    Resp:    Temp:    SpO2: 98% 97%   Vitals:   07/19/17 2326 07/20/17 0430 07/20/17 0804 07/20/17 1414  BP: (!) 152/66 120/66    Pulse: (!) 113 72    Resp: 18 18    Temp: 98.1 F (36.7 C) 97.7 F (36.5 C)    TempSrc: Oral Oral     SpO2: 96% 99% 98% 97%  Weight:      Height:        General: Pt is alert, awake, not in acute distress Cardiovascular: RRR, S1/S2 +, no rubs, no gallops Respiratory: CTA bilaterally, no wheezing, no rhonchi Abdominal: Soft, NT, ND, bowel sounds + Extremities: no edema, no cyanosis    The results of significant diagnostics from this hospitalization (including imaging, microbiology, ancillary and laboratory) are listed below for reference.     Microbiology: No results found for this or any previous visit (from the past 240 hour(s)).   Labs: BNP (last 3 results)  Recent Labs  07/03/17 2022 07/18/17 2052  BNP 8.2 64.0  Basic Metabolic Panel:  Recent Labs Lab 07/18/17 2052 07/20/17 0640  NA 137 137  K 3.9 3.8  CL 102 102  CO2 26 26  GLUCOSE 149* 198*  BUN 12 17  CREATININE 0.77 0.77  CALCIUM 9.2 8.5*   Liver Function Tests: No results for input(s): AST, ALT, ALKPHOS, BILITOT, PROT, ALBUMIN in the last 168 hours. No results for input(s): LIPASE, AMYLASE in the last 168 hours. No results for input(s): AMMONIA in the last 168 hours. CBC:  Recent Labs Lab 07/18/17 2052 07/20/17 0640  WBC 18.2* 23.1*  NEUTROABS 12.3*  --   HGB 17.3* 15.7  HCT 50.2 47.7  MCV 84.1 86.3  PLT 267 248   Cardiac Enzymes:  Recent Labs Lab 07/18/17 2052 07/18/17 2356 07/19/17 0622 07/19/17 1157  TROPONINI <0.03 <0.03 <0.03 <0.03   BNP: Invalid input(s): POCBNP CBG:  Recent Labs Lab 07/20/17 0008 07/20/17 0426 07/20/17 0614 07/20/17 0818 07/20/17 1122  GLUCAP 268* 193* 246* 154* 154*   D-Dimer No results for input(s): DDIMER in the last 72 hours. Hgb A1c  Recent Labs  07/18/17 2356  HGBA1C 6.3*   Lipid Profile No results for input(s): CHOL, HDL, LDLCALC, TRIG, CHOLHDL, LDLDIRECT in the last 72 hours. Thyroid function studies No results for input(s): TSH, T4TOTAL, T3FREE, THYROIDAB in the last 72 hours.  Invalid input(s): FREET3 Anemia work up No  results for input(s): VITAMINB12, FOLATE, FERRITIN, TIBC, IRON, RETICCTPCT in the last 72 hours. Urinalysis    Component Value Date/Time   COLORURINE YELLOW 07/19/2017 1910   APPEARANCEUR HAZY (A) 07/19/2017 1910   LABSPEC 1.032 (H) 07/19/2017 1910   PHURINE 5.0 07/19/2017 1910   GLUCOSEU >=500 (A) 07/19/2017 1910   HGBUR MODERATE (A) 07/19/2017 1910   BILIRUBINUR NEGATIVE 07/19/2017 1910   KETONESUR NEGATIVE 07/19/2017 1910   PROTEINUR 100 (A) 07/19/2017 1910   NITRITE NEGATIVE 07/19/2017 1910   LEUKOCYTESUR NEGATIVE 07/19/2017 1910   Sepsis Labs Invalid input(s): PROCALCITONIN,  WBC,  LACTICIDVEN Microbiology No results found for this or any previous visit (from the past 240 hour(s)).   Time coordinating discharge: Over 30 minutes  SIGNED:   Kathie Dike, MD  [facility] 07/20/2017, 7:55 PM Pager   If 7PM-7AM, please contact night-coverage [email or web address] Password [email or web address]

## 2017-07-21 ENCOUNTER — Telehealth: Payer: Self-pay | Admitting: Family Medicine

## 2017-07-21 ENCOUNTER — Encounter: Payer: Self-pay | Admitting: Family Medicine

## 2017-07-21 ENCOUNTER — Ambulatory Visit (INDEPENDENT_AMBULATORY_CARE_PROVIDER_SITE_OTHER): Payer: Medicare Other | Admitting: Family Medicine

## 2017-07-21 VITALS — BP 122/78 | HR 90 | Temp 98.6°F | Resp 24 | Ht 66.0 in | Wt 213.0 lb

## 2017-07-21 DIAGNOSIS — I69998 Other sequelae following unspecified cerebrovascular disease: Secondary | ICD-10-CM | POA: Diagnosis not present

## 2017-07-21 DIAGNOSIS — I69398 Other sequelae of cerebral infarction: Secondary | ICD-10-CM | POA: Insufficient documentation

## 2017-07-21 DIAGNOSIS — Z789 Other specified health status: Secondary | ICD-10-CM | POA: Diagnosis not present

## 2017-07-21 DIAGNOSIS — N319 Neuromuscular dysfunction of bladder, unspecified: Secondary | ICD-10-CM

## 2017-07-21 DIAGNOSIS — Z8521 Personal history of malignant neoplasm of larynx: Secondary | ICD-10-CM | POA: Insufficient documentation

## 2017-07-21 DIAGNOSIS — J449 Chronic obstructive pulmonary disease, unspecified: Secondary | ICD-10-CM | POA: Diagnosis not present

## 2017-07-21 DIAGNOSIS — Z66 Do not resuscitate: Secondary | ICD-10-CM

## 2017-07-21 DIAGNOSIS — I639 Cerebral infarction, unspecified: Secondary | ICD-10-CM | POA: Diagnosis not present

## 2017-07-21 DIAGNOSIS — Z794 Long term (current) use of insulin: Secondary | ICD-10-CM | POA: Diagnosis not present

## 2017-07-21 DIAGNOSIS — Z23 Encounter for immunization: Secondary | ICD-10-CM

## 2017-07-21 DIAGNOSIS — IMO0001 Reserved for inherently not codable concepts without codable children: Secondary | ICD-10-CM

## 2017-07-21 DIAGNOSIS — E119 Type 2 diabetes mellitus without complications: Secondary | ICD-10-CM | POA: Diagnosis not present

## 2017-07-21 NOTE — Patient Instructions (Addendum)
We will place urgent referral to hospice. See me every 3 months. Call sooner for problems.

## 2017-07-21 NOTE — Progress Notes (Addendum)
Chief Complaint  Patient presents with  . COPD  This is a new patient. He was just released from the hospital a couple of days ago. He doesn't currently have a PCP. The patient has end-stage severe COPD. He is oxygen dependent. He is dyspneic at rest. He previously was under the care of hospice and care for in the home. He went off of hospice briefly, states he thought he might get better, now feels he is better off on hospice.  He states one hospice was discontinued they took his oxygen, his nebulizer, and his hospital bed. He also was tapering off of morphine. He feels like he was more comfortable and had better breathing while taking morphine. He is not on any inhalers. He continues to smoke cigarettes. He only smokes 3 a day. He has a long history of heavy smoking. He has end-stage COPD. He has a history of a stroke. He has a history of cancer of the larynx. After his stroke he has persistent left-sided weakness. He has swallowing difficulty. He can only drink Glucerna. He does not eat solids. He previously had a feeding tube, but states that "it fell out". He is unable to urinate and has to self catheter himself 2 or 3 times a day. He is continent of bowels. He lives with his ex-wife. She is his caregiver. He has well-controlled diabetes. He is on an insulin sliding scale. His last hemoglobin A1c was 6.3.  Patient Active Problem List   Diagnosis Date Noted  . Stroke ([place]) 07/21/2017  . History of cancer of larynx 07/21/2017  . Self-catheterizes urinary bladder 07/21/2017  . 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 DNR no code (do not resuscitate) 07/21/2017  . Pressure injury of skin 07/19/2017  . Chest pain 07/18/2017  . COPD, very severe ([place]) 07/18/2017  . Unstable angina pectoris ([place]) 07/01/2017    Outpatient Encounter Prescriptions as of 07/21/2017  Medication Sig  . albuterol (PROVENTIL) (2.5 MG/3ML) 0.083% nebulizer solution Take 3 mLs (2.5 mg total) by nebulization  every 6 (six) hours as needed for wheezing or shortness of breath.  Marland Kitchen apixaban (ELIQUIS) 5 MG TABS tablet Take 1 tablet (5 mg total) by mouth 2 (two) times daily.  Marland Kitchen docusate sodium (COLACE) 100 MG capsule Take 100 mg by mouth 2 (two) times daily as needed for constipation.  . feeding supplement, GLUCERNA SHAKE, (GLUCERNA SHAKE) LIQD Take 237 mLs by mouth 4 (four) times daily.  . furosemide (LASIX) 40 MG tablet Take 40 mg by mouth daily as needed for fluid.  Marland Kitchen gabapentin (NEURONTIN) 600 MG tablet Take 600 mg by mouth 3 (three) times daily.  . insulin aspart (NOVOLOG) 100 UNIT/ML injection Inject 8-13 Units into the skin 4 (four) times daily. 8 units before each meal and 13 units at bedtime.  Marland Kitchen LORazepam (ATIVAN) 0.5 MG tablet Take 0.5 mg by mouth every 4 (four) hours as needed for anxiety.  Marland Kitchen morphine (MS CONTIN) 60 MG 12 hr tablet Take 1 tablet (60 mg total) by mouth 2 (two) times daily.  Marland Kitchen morphine (MSIR) 15 MG tablet Take 1 tablet (15 mg total) by mouth every 6 (six) hours as needed for severe pain.  . nitroGLYCERIN (NITROSTAT) 0.4 MG SL tablet Place 1 tablet under the tongue daily as needed for chest pain.  . predniSONE (DELTASONE) 10 MG tablet Take 40mg  po daily for 2 days then 30mg  daily for 2 days then 20mg  daily for 2 days then 10mg  daily for  2 days then stop  . prochlorperazine (COMPAZINE) 5 MG tablet Take 5 mg by mouth every 6 (six) hours as needed for nausea or vomiting.  . tamsulosin (FLOMAX) 0.4 MG CAPS capsule Take 0.4 mg by mouth daily.  Marland Kitchen gabapentin (NEURONTIN) 300 MG capsule    No facility-administered encounter medications on file as of 07/21/2017.     Past Medical History:  Diagnosis Date  . Anxiety   . Arthritis    knees and back  . Asthma   . Cancer ([place])    Larynx  . Cataract   . CHF (congestive heart failure) ([place])   . COPD (chronic obstructive pulmonary disease) ([place])   . Depression   . Diabetes mellitus without complication ([place])   . Emphysema of lung ([place])     . GERD (gastroesophageal reflux disease)   . Hyperlipidemia   . Hypertension   . Kidney disease   . Liver disease   . Lung mass   . Myocardial infarction ([place])   . Opioid withdrawal ([place])   . Oxygen deficiency   . Stroke [facility])     Past Surgical History:  Procedure Laterality Date  . NECK SURGERY    . THROAT SURGERY      Social History   Social History  . Marital status: Divorced    Spouse name: N/A  . Number of children: 2  . Years of education: 16   Occupational History  . disabled     RN   Social History Main Topics  . Smoking status: Current Every Day Smoker    Packs/day: 3.00    Types: Cigarettes    Start date: 11/25/1977  . Smokeless tobacco: Never Used  . Alcohol use No  . Drug use: No  . Sexual activity: No   Other Topics Concern  . Not on file   Social History Narrative   Disabled from cancer   RN   Ex wife is caregiver - Susanne Borders    Family History  Problem Relation Age of Onset  . Arthritis Mother   . Asthma Mother   . COPD Mother   . Depression Mother   . Hyperlipidemia Mother   . Hypertension Mother   . Miscarriages / [place] Mother   . Alzheimer's disease Mother   . Alcohol abuse Father   . Drug abuse Father   . Early death Father 44       gunshot  . Heart disease Brother 29  . Early death Son        SIDS  . Early death Son        MVA    Review of Systems  Constitutional: Negative for chills, fever and weight loss.       Weight has gone up  HENT: Negative for congestion and hearing loss.        Difficulty swallowing  Eyes: Negative for blurred vision and pain.  Respiratory: Positive for cough, sputum production, shortness of breath and wheezing. Negative for hemoptysis.   Cardiovascular: Positive for leg swelling. Negative for chest pain.  Gastrointestinal: Negative for constipation and diarrhea.       Constipation from narcotics managed with stool softeners  Genitourinary:       Self-catheterizes, neurogenic bladder   Musculoskeletal: Positive for back pain and joint pain. Negative for falls and myalgias.       Arthritis back and knees  Neurological: Negative for dizziness, seizures and headaches.  Psychiatric/Behavioral: Negative for depression, substance abuse and suicidal ideas. The patient  is nervous/anxious. The patient does not have insomnia.        Uses when necessary Ativan (hospice)    BP 122/78 (BP Location: Right Arm, Patient Position: Sitting, Cuff Size: Large)   Pulse 90   Temp 98.6 F (37 C) (Temporal)   Resp (!) 24   Ht 5\' 6"  (1.676 m)   Wt 213 lb (96.6 kg)   SpO2 100% Comment: o2 3 lpm via n/c  BMI 34.38 kg/m   Physical Exam  Constitutional: He is oriented to person, place, and time. He appears well-developed and well-nourished.  Severely dyspneic  HENT:  Head: Normocephalic and atraumatic.  Nose: Nose normal.  Edentulous  Eyes: Pupils are equal, round, and reactive to light. Conjunctivae are normal.  Neck: Normal range of motion.  Cardiovascular: Normal rate, regular rhythm and normal heart sounds.   Pulmonary/Chest: He has wheezes.  Decreased breath sounds. Decreased lung excursion. Rales and rhonchi throughout.  Abdominal: Soft. Bowel sounds are normal. He exhibits no distension. There is no tenderness.  Dimple seen at gastric tube site, no drainage  Musculoskeletal: Normal range of motion. He exhibits edema.  Trace edema  Lymphadenopathy:    He has no cervical adenopathy.  Neurological: He is alert and oriented to person, place, and time.  Skin: Skin is warm and dry. No rash noted.  Psychiatric: He has a normal mood and affect. His behavior is normal. Thought content normal.    1. COPD, very severe ([facility]) End-stage. Oxygen dependent. - Ambulatory referral to Hospice 2. Cerebrovascular accident (CVA), unspecified mechanism ([place]) With left hemiparesis, swallowing difficulty, neurogenic bladder  3. History of cancer of larynx Remote  4. Self-catheterizes urinary  bladder Independent  5. 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 DNR no code (do not resuscitate) Per patient. Paperwork at home. Advised to bring me copy  7. Need for influenza vaccination Updated today - Flu Vaccine QUAD 36+ mos IM 8. Need for pneumococcal vaccination Updated today - Pneumococcal conjugate vaccine 13-valent IM 9. Insulin-dependent diabetes Well-controlled  Patient Instructions  We will place urgent referral to hospice. See me every 3 months. Call sooner for problems.     Raylene Everts, MD

## 2017-07-22 ENCOUNTER — Telehealth: Payer: Self-pay | Admitting: Family Medicine

## 2017-07-22 ENCOUNTER — Emergency Department (HOSPITAL_COMMUNITY)
Admission: EM | Admit: 2017-07-22 | Discharge: 2017-07-22 | Disposition: A | Discharge status: Home / Self Care | Payer: Medicare Other | Attending: Emergency Medicine | Admitting: Emergency Medicine

## 2017-07-22 ENCOUNTER — Other Ambulatory Visit: Payer: Self-pay

## 2017-07-22 ENCOUNTER — Encounter (HOSPITAL_COMMUNITY): Payer: Self-pay | Admitting: Emergency Medicine

## 2017-07-22 DIAGNOSIS — I252 Old myocardial infarction: Secondary | ICD-10-CM | POA: Diagnosis not present

## 2017-07-22 DIAGNOSIS — I110 Hypertensive heart disease with heart failure: Secondary | ICD-10-CM | POA: Diagnosis not present

## 2017-07-22 DIAGNOSIS — Z7901 Long term (current) use of anticoagulants: Secondary | ICD-10-CM | POA: Insufficient documentation

## 2017-07-22 DIAGNOSIS — E119 Type 2 diabetes mellitus without complications: Secondary | ICD-10-CM | POA: Diagnosis not present

## 2017-07-22 DIAGNOSIS — Z9981 Dependence on supplemental oxygen: Secondary | ICD-10-CM | POA: Diagnosis not present

## 2017-07-22 DIAGNOSIS — Z794 Long term (current) use of insulin: Secondary | ICD-10-CM | POA: Diagnosis not present

## 2017-07-22 DIAGNOSIS — F17210 Nicotine dependence, cigarettes, uncomplicated: Secondary | ICD-10-CM | POA: Diagnosis not present

## 2017-07-22 DIAGNOSIS — I509 Heart failure, unspecified: Secondary | ICD-10-CM | POA: Insufficient documentation

## 2017-07-22 DIAGNOSIS — F329 Major depressive disorder, single episode, unspecified: Secondary | ICD-10-CM | POA: Insufficient documentation

## 2017-07-22 DIAGNOSIS — Z8673 Personal history of transient ischemic attack (TIA), and cerebral infarction without residual deficits: Secondary | ICD-10-CM | POA: Insufficient documentation

## 2017-07-22 DIAGNOSIS — Z66 Do not resuscitate: Secondary | ICD-10-CM | POA: Insufficient documentation

## 2017-07-22 DIAGNOSIS — J45909 Unspecified asthma, uncomplicated: Secondary | ICD-10-CM | POA: Diagnosis not present

## 2017-07-22 DIAGNOSIS — R071 Chest pain on breathing: Secondary | ICD-10-CM | POA: Diagnosis not present

## 2017-07-22 DIAGNOSIS — J449 Chronic obstructive pulmonary disease, unspecified: Secondary | ICD-10-CM | POA: Insufficient documentation

## 2017-07-22 DIAGNOSIS — Z79899 Other long term (current) drug therapy: Secondary | ICD-10-CM | POA: Insufficient documentation

## 2017-07-22 DIAGNOSIS — R079 Chest pain, unspecified: Secondary | ICD-10-CM | POA: Diagnosis present

## 2017-07-22 DIAGNOSIS — F419 Anxiety disorder, unspecified: Secondary | ICD-10-CM | POA: Insufficient documentation

## 2017-07-22 DIAGNOSIS — C329 Malignant neoplasm of larynx, unspecified: Secondary | ICD-10-CM | POA: Insufficient documentation

## 2017-07-22 MED ORDER — LORAZEPAM 0.5 MG PO TABS: 0.5000 mg | ORAL_TABLET | Freq: Three times a day (TID) | ORAL | 0 refills | Status: DC | PRN

## 2017-07-22 MED ORDER — MORPHINE SULFATE ER 30 MG PO TBCR: 30.0000 mg | EXTENDED_RELEASE_TABLET | Freq: Four times a day (QID) | ORAL | 0 refills | Status: DC

## 2017-07-22 MED ORDER — LORAZEPAM 0.5 MG PO TABS
0.5000 mg | ORAL_TABLET | Freq: Once | ORAL | Status: AC
  Administered 2017-07-22: 0.5 mg via ORAL

## 2017-07-22 MED ORDER — MORPHINE SULFATE 15 MG PO TABS: 15.0000 mg | ORAL_TABLET | Freq: Four times a day (QID) | ORAL | 0 refills | Status: DC | PRN

## 2017-07-22 MED ORDER — HYDROMORPHONE HCL 1 MG/ML IJ SOLN
2.0000 mg | Freq: Once | INTRAMUSCULAR | Status: AC
  Administered 2017-07-22: 2 mg via INTRAMUSCULAR
  Filled 2017-07-22: qty 2

## 2017-07-22 NOTE — Telephone Encounter (Signed)
Armando Gang, [facility], left message regarding patient. She states they are admitting him tomorrow. There was a lot of static in the message. She needs to discuss something about Ativan and Morphine.   Callback# [phone]

## 2017-07-22 NOTE — ED Triage Notes (Signed)
Pt here for pain control; pt sts starting hospice care tomorrow and stopping all treatment for any dx process due to lung CA and PE; pt sts in extreme pain and out of home pain meds; pt discharged from [facility] Sunday to have hospice on Monday that was delayed

## 2017-07-23 NOTE — Telephone Encounter (Signed)
Spoke to cindy, [facility] is admitted, they are managing his meds.

## 2017-07-23 NOTE — Telephone Encounter (Signed)
Jenny Reichmann called back and left message regarding patient. She states that she did admit patient today and that patient is requesting refill on Morphine 30mg  that he takes every 12 hours as needed for pain, and Morphine 15 mg that he takes every six hours as needed for breakthrough pain. He is currently using [employer], but would like to switch to [employer].   Jenny Reichmann callback# [phone]

## 2017-07-23 NOTE — ED Provider Notes (Signed)
[facility] DEPT Provider Note   CSN: [id number] Arrival date & time: 07/22/17  1741     History   Chief Complaint Chief Complaint  Patient presents with  . Chest Pain    HPI Roger Flores is a 56 y.o. male.  HPI   56 yo M with h/o laryngeal CA, severe COPD here with pain. Pt was just recently discharged to Hospice care on 8/26. He states that he had difficulty arranging this over the weekend but now has it in place starting tomorrow, but has run out of his pain meds. He endorses severe, generalized but primarily chest pain. This pain is similar to his chronic pain related to his underlying lung disease. He has a cough but this is also chronic. He is requesting analgesia to get him through until Hospice can be fully arranged in his house.  Past Medical History:  Diagnosis Date  . Anxiety   . Arthritis    knees and back  . Asthma   . Cancer ([place])    Larynx  . Cataract   . CHF (congestive heart failure) ([place])   . COPD (chronic obstructive pulmonary disease) ([place])   . Depression   . Diabetes mellitus without complication ([place])   . Emphysema of lung ([place])   . GERD (gastroesophageal reflux disease)   . Hyperlipidemia   . Hypertension   . Kidney disease   . Liver disease   . Lung mass   . Myocardial infarction ([place])   . Opioid withdrawal ([place])   . Oxygen deficiency   . Stroke [facility])     Patient Active Problem List   Diagnosis Date Noted  . Stroke ([place]) 07/21/2017  . History of cancer of larynx 07/21/2017  . Self-catheterizes urinary bladder 07/21/2017  . 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 DNR no code (do not resuscitate) 07/21/2017  . Insulin dependent diabetes mellitus ([place]) 07/21/2017  . Pressure injury of skin 07/19/2017  . Chest pain 07/18/2017  . COPD, very severe ([place]) 07/18/2017  . Unstable angina pectoris ([place]) 07/01/2017    Past Surgical History:  Procedure Laterality Date  . NECK SURGERY    . THROAT SURGERY         Home  Medications    Prior to Admission medications   Medication Sig Start Date End Date Taking? Authorizing Provider  albuterol (PROVENTIL) (2.5 MG/3ML) 0.083% nebulizer solution Take 3 mLs (2.5 mg total) by nebulization every 6 (six) hours as needed for wheezing or shortness of breath. 07/20/17  Yes Kathie Dike, MD  apixaban (ELIQUIS) 5 MG TABS tablet Take 1 tablet (5 mg total) by mouth 2 (two) times daily. 07/04/17  Yes Jola Schmidt, MD  docusate sodium (COLACE) 100 MG capsule Take 100 mg by mouth 2 (two) times daily as needed for constipation.   Yes [provider]  feeding supplement, GLUCERNA SHAKE, (GLUCERNA SHAKE) LIQD Take 237 mLs by mouth 2 (two) times daily.    Yes [provider]  furosemide (LASIX) 40 MG tablet Take 40 mg by mouth daily as needed for fluid.   Yes [provider]  insulin aspart (NOVOLOG) 100 UNIT/ML injection Inject 8-13 Units into the skin 4 (four) times daily. 8 units before each meal and 13 units at bedtime.   Yes [provider]  nitroGLYCERIN (NITROSTAT) 0.4 MG SL tablet Place 1 tablet under the tongue daily as needed for chest pain. 05/23/17  Yes [provider]  OXYGEN Inhale 3 L into the lungs  daily. At home   Yes [provider]  prochlorperazine (COMPAZINE) 5 MG tablet Take 5 mg by mouth every 6 (six) hours as needed for nausea or vomiting.   Yes [provider]  tamsulosin (FLOMAX) 0.4 MG CAPS capsule Take 0.4 mg by mouth daily.   Yes [provider]  LORazepam (ATIVAN) 0.5 MG tablet Take 1 tablet (0.5 mg total) by mouth every 8 (eight) hours as needed for anxiety. 07/22/17   Duffy Bruce, MD  morphine (MS CONTIN) 30 MG 12 hr tablet Take 1 tablet (30 mg total) by mouth every 6 (six) hours. 07/22/17   Duffy Bruce, MD  morphine (MSIR) 15 MG tablet Take 1 tablet (15 mg total) by mouth every 6 (six) hours as needed for severe pain. 07/22/17   Duffy Bruce, MD  predniSONE (DELTASONE) 10 MG  tablet Take 40mg  po daily for 2 days then 30mg  daily for 2 days then 20mg  daily for 2 days then 10mg  daily for 2 days then stop Patient not taking: Reported on 07/22/2017 07/20/17   Kathie Dike, MD    Family History Family History  Problem Relation Age of Onset  . Arthritis Mother   . Asthma Mother   . COPD Mother   . Depression Mother   . Hyperlipidemia Mother   . Hypertension Mother   . Miscarriages / [place] Mother   . Alzheimer's disease Mother   . Alcohol abuse Father   . Drug abuse Father   . Early death Father 48       gunshot  . Heart disease Brother 42  . Early death Son        SIDS  . Early death Son        MVA    Social History Social History  Substance Use Topics  . Smoking status: Current Every Day Smoker    Packs/day: 3.00    Types: Cigarettes    Start date: 11/25/1977  . Smokeless tobacco: Never Used  . Alcohol use No     Allergies   Patient has no known allergies.   Review of Systems Review of Systems  Constitutional: Positive for fatigue. Negative for chills and fever.  HENT: Negative for congestion and rhinorrhea.   Eyes: Negative for visual disturbance.  Respiratory: Positive for cough, chest tightness and shortness of breath. Negative for wheezing.   Cardiovascular: Positive for chest pain. Negative for leg swelling.  Gastrointestinal: Negative for abdominal pain, diarrhea, nausea and vomiting.  Genitourinary: Negative for dysuria and flank pain.  Musculoskeletal: Negative for neck pain and neck stiffness.  Skin: Negative for rash and wound.  Allergic/Immunologic: Negative for immunocompromised state.  Neurological: Negative for syncope, weakness and headaches.  All other systems reviewed and are negative.    Physical Exam Updated Vital Signs BP (!) 142/92 (BP Location: Left Arm)   Pulse 79   Temp 99.6 F (37.6 C) (Oral)   Resp 16   Ht 5\' 6"  (1.676 m)   Wt 94.3 kg (208 lb)   SpO2 98%   BMI 33.57 kg/m   Physical Exam    Constitutional: He is oriented to person, place, and time. He appears well-developed and well-nourished. He appears distressed.  HENT:  Head: Normocephalic and atraumatic.  Eyes: Conjunctivae are normal.  Neck: Neck supple.  Cardiovascular: Normal rate, regular rhythm and normal heart sounds.  Exam reveals no friction rub.   No murmur heard. Pulmonary/Chest: Effort normal. No respiratory distress. He has decreased breath sounds. He has no wheezes. He  has rhonchi in the right middle field, the right lower field, the left middle field and the left lower field. He has no rales.  Abdominal: He exhibits no distension.  Musculoskeletal: He exhibits no edema.  Neurological: He is alert and oriented to person, place, and time. He exhibits normal muscle tone.  Skin: Skin is warm. Capillary refill takes less than 2 seconds.  Psychiatric: He has a normal mood and affect.  Nursing note and vitals reviewed.    ED Treatments / Results  Labs (all labs ordered are listed, but only abnormal results are displayed) Labs Reviewed - No data to display  EKG  EKG Interpretation None       Radiology No results found.  Procedures Procedures (including critical care time)  Medications Ordered in ED Medications  HYDROmorphone (DILAUDID) injection 2 mg (2 mg Intramuscular Given 07/22/17 1854)  LORazepam (ATIVAN) tablet 0.5 mg (0.5 mg Oral Given 07/22/17 1853)     Initial Impression / Assessment and Plan / ED Course  I have reviewed the triage vital signs and the nursing notes.  Pertinent labs & imaging results that were available during my care of the patient were reviewed by me and considered in my medical decision making (see chart for details).     56 yo M with severe COPD, laryngeal CA on home hospice here with severe, acute on chronic chest pain. Pt is currently DNR on Hospice but had difficulty getting this arranged over the weekend. He is in obvious distress with significant pain on exam.  He declines any work-up of his pain at this time as he is "ready to go" and has been extensively evaluated recently. Given his DNR/Hospice status, feel this is reasonable. Pt given IM dilaudid here and I will give him a brief course of his usual home pain meds, to bridge him until Hospice can be arranged. Pt and family thankful and in agreement.  Final Clinical Impressions(s) / ED Diagnoses   Final diagnoses:  Chest pain on breathing    New Prescriptions Discharge Medication List as of 07/22/2017  7:19 PM       Duffy Bruce, MD 07/23/17 [phone]

## 2017-07-24 ENCOUNTER — Telehealth: Payer: Self-pay

## 2017-07-24 ENCOUNTER — Encounter: Payer: Self-pay | Admitting: Family Medicine

## 2017-07-24 ENCOUNTER — Ambulatory Visit (INDEPENDENT_AMBULATORY_CARE_PROVIDER_SITE_OTHER): Admitting: Family Medicine

## 2017-07-24 DIAGNOSIS — G8929 Other chronic pain: Secondary | ICD-10-CM | POA: Insufficient documentation

## 2017-07-24 NOTE — Telephone Encounter (Signed)
Pt coming in this am to see dr Meda Coffee to discuss meds.

## 2017-07-24 NOTE — Telephone Encounter (Signed)
I called and spoke to cindy with hospice. She states Deandra went to the er Tuesday night for pain meds, and per Lamonte was given 10 tabs morphine 30 mg, 10 tabs morphine 15 mg and 10 tabs ativan. She also stated Coral has been under their services before, and he does not want the hospice MD writing his medications. I have called Dravyn this am and he is coming in today to discuss medications with Dr Meda Coffee.

## 2017-07-24 NOTE — Telephone Encounter (Signed)
I am not clear on who is prescribing.  I asked for it to be hospice doctor managing pain

## 2017-07-24 NOTE — Progress Notes (Signed)
Patient called and asked to come in for clarification of recent events.  When evaluated on the 27th, we discussed his condition and plan.  I informed his that I do NOT do chronic pain management and that hospice would need to provide controlled substances.  He informed me that he had enough medicine to last until Sept 7th, and that he did not  Need a Rx. He went to the ER that same evening and told the ER doctor he is out of medicine and that his PCP would not provide it for him.  He was given a 10 d supple of morphine and lorazepam to hold him over.  The [facility] database shows he got a 30 day supply of Morphine ER 60 mg, #60 and Morphine IR 20 mg #120.  He should have enough to last without the ER prescriptions.   His ER note says he has been on hospice off and on for many years.  I do not know the criteria used, but I am not certain at this point of hospice is even appropriate.  I would recommend a follow up with pulmonary medicine and PFT to determine the severity of his CoPD.  His O2 has been 100% at visits here and at the ER.  He is not on any COPD medication.  I informed patient today that I feel he was not being honest with is medical providers.  I recommend he find another PCP.  He is discharged from  My care.  I will not provide any narcotic or controlled substances.  [facility]N

## 2017-07-24 NOTE — Patient Instructions (Signed)
I will care for medical needs and emergency care for 30 days or until you find a new doctor  I will not provide any controlled prescriptions like lorazepam or narcotics  I recommend you call :  [facility] ( Dr Maudie Mercury) - [phone]  Dr Nevada Crane - [phone]

## 2017-07-26 ENCOUNTER — Emergency Department (HOSPITAL_COMMUNITY): Payer: Medicare Other

## 2017-07-26 ENCOUNTER — Encounter (HOSPITAL_COMMUNITY): Payer: Self-pay | Admitting: Emergency Medicine

## 2017-07-26 ENCOUNTER — Emergency Department (HOSPITAL_COMMUNITY)
Admission: EM | Admit: 2017-07-26 | Discharge: 2017-07-26 | Disposition: A | Discharge status: Home / Self Care | Payer: Medicare Other | Attending: Emergency Medicine | Admitting: Emergency Medicine

## 2017-07-26 ENCOUNTER — Other Ambulatory Visit: Payer: Self-pay

## 2017-07-26 ENCOUNTER — Emergency Department (HOSPITAL_COMMUNITY)
Admission: EM | Admit: 2017-07-26 | Discharge: 2017-07-27 | Disposition: A | Discharge status: Home / Self Care | Payer: Medicare Other | Attending: Emergency Medicine | Admitting: Emergency Medicine

## 2017-07-26 DIAGNOSIS — I509 Heart failure, unspecified: Secondary | ICD-10-CM | POA: Insufficient documentation

## 2017-07-26 DIAGNOSIS — I110 Hypertensive heart disease with heart failure: Secondary | ICD-10-CM | POA: Insufficient documentation

## 2017-07-26 DIAGNOSIS — Z79899 Other long term (current) drug therapy: Secondary | ICD-10-CM | POA: Insufficient documentation

## 2017-07-26 DIAGNOSIS — Z7901 Long term (current) use of anticoagulants: Secondary | ICD-10-CM | POA: Insufficient documentation

## 2017-07-26 DIAGNOSIS — J449 Chronic obstructive pulmonary disease, unspecified: Secondary | ICD-10-CM | POA: Insufficient documentation

## 2017-07-26 DIAGNOSIS — Z8673 Personal history of transient ischemic attack (TIA), and cerebral infarction without residual deficits: Secondary | ICD-10-CM | POA: Diagnosis not present

## 2017-07-26 DIAGNOSIS — R0789 Other chest pain: Secondary | ICD-10-CM | POA: Diagnosis not present

## 2017-07-26 DIAGNOSIS — Z8521 Personal history of malignant neoplasm of larynx: Secondary | ICD-10-CM | POA: Insufficient documentation

## 2017-07-26 DIAGNOSIS — E119 Type 2 diabetes mellitus without complications: Secondary | ICD-10-CM | POA: Insufficient documentation

## 2017-07-26 DIAGNOSIS — Y939 Activity, unspecified: Secondary | ICD-10-CM | POA: Diagnosis not present

## 2017-07-26 DIAGNOSIS — Y929 Unspecified place or not applicable: Secondary | ICD-10-CM | POA: Insufficient documentation

## 2017-07-26 DIAGNOSIS — X500XXA Overexertion from strenuous movement or load, initial encounter: Secondary | ICD-10-CM | POA: Diagnosis not present

## 2017-07-26 DIAGNOSIS — R062 Wheezing: Secondary | ICD-10-CM | POA: Diagnosis not present

## 2017-07-26 DIAGNOSIS — S29012A Strain of muscle and tendon of back wall of thorax, initial encounter: Secondary | ICD-10-CM | POA: Diagnosis not present

## 2017-07-26 DIAGNOSIS — Z794 Long term (current) use of insulin: Secondary | ICD-10-CM | POA: Insufficient documentation

## 2017-07-26 DIAGNOSIS — Z8709 Personal history of other diseases of the respiratory system: Secondary | ICD-10-CM

## 2017-07-26 DIAGNOSIS — R0781 Pleurodynia: Secondary | ICD-10-CM

## 2017-07-26 DIAGNOSIS — T148XXA Other injury of unspecified body region, initial encounter: Secondary | ICD-10-CM

## 2017-07-26 DIAGNOSIS — G894 Chronic pain syndrome: Secondary | ICD-10-CM | POA: Insufficient documentation

## 2017-07-26 DIAGNOSIS — F17210 Nicotine dependence, cigarettes, uncomplicated: Secondary | ICD-10-CM | POA: Diagnosis not present

## 2017-07-26 DIAGNOSIS — Y999 Unspecified external cause status: Secondary | ICD-10-CM | POA: Diagnosis not present

## 2017-07-26 DIAGNOSIS — R0602 Shortness of breath: Secondary | ICD-10-CM | POA: Diagnosis present

## 2017-07-26 DIAGNOSIS — R05 Cough: Secondary | ICD-10-CM | POA: Diagnosis not present

## 2017-07-26 MED ORDER — PREDNISONE 20 MG PO TABS: 40.0000 mg | ORAL_TABLET | Freq: Every day | ORAL | 0 refills | Status: AC

## 2017-07-26 MED ORDER — ALBUTEROL SULFATE (2.5 MG/3ML) 0.083% IN NEBU
5.0000 mg | INHALATION_SOLUTION | Freq: Once | RESPIRATORY_TRACT | Status: AC
  Administered 2017-07-26: 5 mg via RESPIRATORY_TRACT
  Filled 2017-07-26: qty 6

## 2017-07-26 MED ORDER — PREDNISONE 20 MG PO TABS
60.0000 mg | ORAL_TABLET | Freq: Once | ORAL | Status: AC
  Administered 2017-07-26: 60 mg via ORAL
  Filled 2017-07-26: qty 3

## 2017-07-26 MED ORDER — IPRATROPIUM BROMIDE 0.02 % IN SOLN
0.5000 mg | Freq: Once | RESPIRATORY_TRACT | Status: AC
  Administered 2017-07-26: 0.5 mg via RESPIRATORY_TRACT
  Filled 2017-07-26: qty 2.5

## 2017-07-26 MED ORDER — OXYCODONE-ACETAMINOPHEN 5-325 MG PO TABS
2.0000 | ORAL_TABLET | Freq: Once | ORAL | Status: AC
  Administered 2017-07-26: 2 via ORAL
  Filled 2017-07-26: qty 2

## 2017-07-26 NOTE — ED Notes (Signed)
When this RN gave pt the percocet ordered by Dr. Ashok Cordia, pt states "that medication is not going to work, I am not getting out of here until this MD fix me, I have been taking Morphine for 30 years now and that is the only thing that helps me, I may have to leave and check in again to get another doctor."

## 2017-07-26 NOTE — ED Notes (Signed)
Pt placed on CCM, EKG done, pt off 02 approx 7-10 min. 02 sat never below 97%. Pt continues to request morphine, EKG given to Dr. Ashok Cordia

## 2017-07-26 NOTE — Discharge Instructions (Signed)
It was our pleasure to provide your ER care today - we hope that you feel better.  It appears you received a month supply of your narcotic pain medication on August 10th, and an additional 5 day supply on August 28th.  As such, our emergency departments are unable to give you an additional narcotic prescription.  Follow up with your doctor this Tuesday for recheck.  Use albuterol treatment as need.  Take prednisone as prescribed. No smoking.   Return to ER if worse, new symptoms, fevers, increased trouble breathing, other medical emergency.  You were given pain medication in the ER - no driving for the next 6 hours or any time if/when taking narcotic pain medication.

## 2017-07-26 NOTE — ED Notes (Signed)
DC instruction given to the pt, pt states that he is not been fix, that he will go and call EMS and come right back until he gets the Morphine that he needs.

## 2017-07-26 NOTE — ED Provider Notes (Addendum)
[facility] DEPT Provider Note   CSN: [id number] Arrival date & time: 07/26/17  1949     History   Chief Complaint Chief Complaint  Patient presents with  . Shortness of Breath    HPI Roger Flores is a 56 y.o. male.  Patient w hx copd, c/o increased wheezing, sob, and non prod cough for past few days.  Symptoms moderate, persistent, slowly worse.  Hx same, notes recent ED visits w similar symptoms. Denies sore throat or runny nose. No fever or chills. States w coughing spells, chest is sore. No exertional chest pain. No pleuritic pain. No leg pain or swelling.    The history is provided by the patient.  Shortness of Breath  Associated symptoms include cough and wheezing. Pertinent negatives include no fever, no headaches, no sore throat, no neck pain, no abdominal pain, no rash and no leg swelling.    Past Medical History:  Diagnosis Date  . Anxiety   . Arthritis    knees and back  . Asthma   . Cancer ([place])    Larynx  . Cataract   . CHF (congestive heart failure) ([place])   . COPD (chronic obstructive pulmonary disease) ([place])   . Depression   . Diabetes mellitus without complication ([place])   . Emphysema of lung ([place])   . GERD (gastroesophageal reflux disease)   . Hyperlipidemia   . Hypertension   . Kidney disease   . Liver disease   . Lung mass   . Myocardial infarction ([place])   . Opioid withdrawal ([place])   . Oxygen deficiency   . Stroke [facility])     Patient Active Problem List   Diagnosis Date Noted  . Chronic pain 07/24/2017  . Stroke ([place]) 07/21/2017  . History of cancer of larynx 07/21/2017  . Self-catheterizes urinary bladder 07/21/2017  . 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 DNR no code (do not resuscitate) 07/21/2017  . Insulin dependent diabetes mellitus ([place]) 07/21/2017  . Pressure injury of skin 07/19/2017  . Chest pain 07/18/2017  . COPD, very severe ([place]) 07/18/2017  . Unstable angina pectoris ([place]) 07/01/2017    Past Surgical  History:  Procedure Laterality Date  . NECK SURGERY    . THROAT SURGERY         Home Medications    Prior to Admission medications   Medication Sig Start Date End Date Taking? Authorizing Provider  albuterol (PROVENTIL) (2.5 MG/3ML) 0.083% nebulizer solution Take 3 mLs (2.5 mg total) by nebulization every 6 (six) hours as needed for wheezing or shortness of breath. 07/20/17  Yes Kathie Dike, MD  apixaban (ELIQUIS) 5 MG TABS tablet Take 1 tablet (5 mg total) by mouth 2 (two) times daily. 07/04/17  Yes Jola Schmidt, MD  docusate sodium (COLACE) 100 MG capsule Take 100 mg by mouth 2 (two) times daily as needed for constipation.   Yes [provider]  feeding supplement, GLUCERNA SHAKE, (GLUCERNA SHAKE) LIQD Take 237 mLs by mouth 2 (two) times daily.    Yes [provider]  furosemide (LASIX) 40 MG tablet Take 40 mg by mouth daily as needed for fluid.   Yes [provider]  insulin aspart (NOVOLOG) 100 UNIT/ML injection Inject 8-13 Units into the skin 4 (four) times daily. 8 units before each meal and 13 units at bedtime.   Yes [provider]  LORazepam (ATIVAN) 0.5 MG tablet Take 1 tablet (0.5 mg total) by mouth every 8 (eight) hours as needed for  anxiety. 07/22/17  Yes Duffy Bruce, MD  morphine (MS CONTIN) 30 MG 12 hr tablet Take 1 tablet (30 mg total) by mouth every 6 (six) hours. 07/22/17  Yes Duffy Bruce, MD  morphine (MSIR) 15 MG tablet Take 1 tablet (15 mg total) by mouth every 6 (six) hours as needed for severe pain. 07/22/17  Yes Duffy Bruce, MD  nitroGLYCERIN (NITROSTAT) 0.4 MG SL tablet Place 1 tablet under the tongue daily as needed for chest pain. 05/23/17  Yes [provider]  OXYGEN Inhale 3 L into the lungs daily. At home   Yes [provider]  predniSONE (DELTASONE) 10 MG tablet Take 40mg  po daily for 2 days then 30mg  daily for 2 days then 20mg  daily for 2 days then 10mg  daily for 2 days then stop 07/20/17  Yes  Kathie Dike, MD  prochlorperazine (COMPAZINE) 5 MG tablet Take 5 mg by mouth every 6 (six) hours as needed for nausea or vomiting.   Yes [provider]  tamsulosin (FLOMAX) 0.4 MG CAPS capsule Take 0.4 mg by mouth daily.   Yes [provider]    Family History Family History  Problem Relation Age of Onset  . Arthritis Mother   . Asthma Mother   . COPD Mother   . Depression Mother   . Hyperlipidemia Mother   . Hypertension Mother   . Miscarriages / [place] Mother   . Alzheimer's disease Mother   . Alcohol abuse Father   . Drug abuse Father   . Early death Father 24       gunshot  . Heart disease Brother 59  . Early death Son        SIDS  . Early death Son        MVA    Social History Social History  Substance Use Topics  . Smoking status: Current Every Day Smoker    Packs/day: 3.00    Types: Cigarettes    Start date: 11/25/1977  . Smokeless tobacco: Never Used  . Alcohol use No     Allergies   Patient has no known allergies.   Review of Systems Review of Systems  Constitutional: Negative for fever.  HENT: Negative for sore throat.   Eyes: Negative for redness.  Respiratory: Positive for cough, shortness of breath and wheezing.   Cardiovascular: Negative for leg swelling.  Gastrointestinal: Negative for abdominal pain.  Genitourinary: Negative for flank pain.  Musculoskeletal: Negative for back pain and neck pain.  Skin: Negative for rash.  Neurological: Negative for headaches.  Hematological: Does not bruise/bleed easily.  Psychiatric/Behavioral: Negative for confusion.     Physical Exam Updated Vital Signs BP (!) 148/92 (BP Location: Left Arm)   Pulse (!) 107   Temp 98 F (36.7 C) (Oral)   Resp 20   Ht 1.676 m (5\' 6" )   Wt 96.6 kg (213 lb)   SpO2 98%   BMI 34.38 kg/m   Physical Exam  Constitutional: He appears well-developed and well-nourished. No distress.  HENT:  Mouth/Throat: Oropharynx is clear and moist.  Eyes:  Conjunctivae are normal.  Neck: Normal range of motion. Neck supple. No tracheal deviation present.  Cardiovascular: Normal rate, regular rhythm, normal heart sounds and intact distal pulses.  Exam reveals no gallop and no friction rub.   No murmur heard. Pulmonary/Chest: Effort normal. No accessory muscle usage. No respiratory distress. He has wheezes.  Abdominal: Soft. Bowel sounds are normal. He exhibits no distension. There is no tenderness.  Genitourinary:  Genitourinary Comments: No cva tenderness  Musculoskeletal: He exhibits no edema or tenderness.  Neurological: He is alert.  Skin: Skin is warm and dry. No rash noted. He is not diaphoretic.  Psychiatric: He has a normal mood and affect.  Nursing note and vitals reviewed.    ED Treatments / Results  Labs (all labs ordered are listed, but only abnormal results are displayed)  Dg Chest 2 View  Result Date: 07/03/2017 CLINICAL DATA:  Chest pain EXAM: CHEST  2 VIEW COMPARISON:  July 01, 2017 FINDINGS: There is atelectatic change in the anterior left base. Lungs elsewhere are clear. Heart size and pulmonary vascularity are normal. No adenopathy. There is aortic atherosclerosis. No bone lesions. IMPRESSION: Atelectatic change anterior left base. Lungs elsewhere clear. Stable cardiac silhouette. There is aortic atherosclerosis. Aortic Atherosclerosis (ICD10-I70.0). Electronically Signed   By: Lowella Grip III M.D.   On: 07/03/2017 15:36   Ct Angio Chest Pe W And/or Wo Contrast  Result Date: 07/04/2017 CLINICAL DATA:  Chest pain for 2 days with dyspnea. History of COPD and lung nodule. EXAM: CT ANGIOGRAPHY CHEST WITH CONTRAST TECHNIQUE: Multidetector CT imaging of the chest was performed using the standard protocol during bolus administration of intravenous contrast. Multiplanar CT image reconstructions and MIPs were obtained to evaluate the vascular anatomy. CONTRAST:  100 cc Isovue 370 IV COMPARISON:  07/03/2017 CXR FINDINGS:  Cardiovascular: Satisfactory opacification of the pulmonary arteries to the segmental level. No evidence of pulmonary embolism. Normal heart size. No pericardial effusion. Cleft like filling defects are seen within the inferior pulmonary veins bilaterally left greater than right. Mediastinum/Nodes: No enlarged mediastinal, hilar, or axillary lymph nodes. Thyroid gland, trachea, and esophagus demonstrate no significant findings. Lungs/Pleura: Bilateral centrilobular emphysema. Bibasilar subsegmental atelectasis and dependent atelectasis. The following pulmonary nodular densities are seen: 1. 5.2 mm in average pulmonary nodule in the right upper lobe series 6, image 39. 2. 4.8 mm in average left upper lobe nodule, series 6, image 47. 3. 3 mm anterior left upper lobe nodule series 6, image 57. Upper Abdomen: No acute abnormality. Musculoskeletal: No chest wall abnormality. No acute or significant osseous findings. Review of the MIP images confirms the above findings. IMPRESSION: 1. No acute pulmonary arterial embolus. 2. Filling defects are seen within the inferior pulmonary veins bilaterally suspicious for clot, albeit uncommon. This can be seen in the setting of pulmonary neoplasm, mitral stenosis with left atrial clot, hypercoagulable state or idiopathic among some possibilities. 3. Bilateral small subcentimeter pulmonary nodules are seen the largest 5.2 mm in average in the right upper lobe. Non-contrast chest CT at 3-6 months is recommended. If the nodules are stable at time of repeat CT, then future CT at 18-24 months (from today's scan) is considered optional for low-risk patients, but is recommended for high-risk patients. This recommendation follows the consensus statement: Guidelines for Management of Incidental Pulmonary Nodules Detected on CT Images: From the Fleischner Society 2017; Radiology 2017; 284:228-243. 4. Bilateral subsegmental atelectasis in each lung base. 5. Centrilobular emphysema and aortic  atherosclerosis. Aortic Atherosclerosis (ICD10-I70.0) and Emphysema (ICD10-J43.9). Electronically Signed   By: Ashley Royalty M.D.   On: 07/04/2017 01:36   Dg Chest Port 1 View  Result Date: 07/18/2017 CLINICAL DATA:  Midsternal chest pain radiating to the left mandible. Shortness of breath. Chronic cough with hemoptysis. EXAM: PORTABLE CHEST 1 VIEW COMPARISON:  Chest CT dated 07/03/2017 and chest radiographs dated 07/03/2017. FINDINGS: Poor inspiration. Normal sized heart. Clear lungs with stable mild diffuse peribronchial thickening. Interval mildly  elevated left hemidiaphragm. Unremarkable bones. IMPRESSION: Poor inspiration with interval mild elevation of the left hemidiaphragm and stable mild chronic bronchitic changes. Electronically Signed   By: Claudie Revering M.D.   On: 07/18/2017 22:10   Dg Chest Port 1 View  Result Date: 07/01/2017 CLINICAL DATA:  Chest pain EXAM: PORTABLE CHEST 1 VIEW COMPARISON:  None. FINDINGS: There is no edema or consolidation. Heart size and pulmonary vascularity are normal. No adenopathy. There is atherosclerotic calcification aorta. No pneumothorax. No evident bone lesion. IMPRESSION: Aortic atherosclerosis.  No edema or consolidation. Aortic Atherosclerosis (ICD10-I70.0). Electronically Signed   By: Lowella Grip III M.D.   On: 07/01/2017 14:12    EKG  EKG Interpretation None       Radiology No results found.  Procedures Procedures (including critical care time)  Medications Ordered in ED Medications  albuterol (PROVENTIL) (2.5 MG/3ML) 0.083% nebulizer solution 5 mg (not administered)  ipratropium (ATROVENT) nebulizer solution 0.5 mg (not administered)     Initial Impression / Assessment and Plan / ED Course  I have reviewed the triage vital signs and the nursing notes.  Pertinent labs & imaging results that were available during my care of the patient were reviewed by me and considered in my medical decision making (see chart for  details).  Labs.  Albuterol and atrovent neb.  Cxr.  pred po.  Pt requests pain med for pain/states has chr pain. Percocet po.  Reviewed nursing notes and prior charts for additional history.   Recheck wheezing improved. No increased wob.   Patient appears pre-occupied with receiving high doses of morphine in ED, repetitively requests large dose of morphine in ED, and appears unconcerned with his breathing or any resp issues.    On review recent prior records, including notes by pcp - pts recent behavior appears c/w narcotic seeking behavior.   pcp note indicates appears pt should have already received adequate rx for pain.   On review [place] Controlled substance database - pt received a month rx of his morphine and morphine ir on 8/10, as well as an additional 5 days supply on 8/28.   Also reviewed recent cta neg for pe.   No indication of acute pain or acutely painful condition requiring emergent high doses narcotic medication as pt requests.   Pt is encouraged to f/u closely with his doctors.   Return precautions provided.    Final Clinical Impressions(s) / ED Diagnoses   Final diagnoses:  None    New Prescriptions New Prescriptions   No medications on file        Lajean Saver, MD 07/26/17 2204

## 2017-07-26 NOTE — ED Notes (Signed)
Pt refuses to sign dc papers.

## 2017-07-26 NOTE — ED Triage Notes (Addendum)
Pt presents with c/o pain with breathing. Pt states he is between doctors but is on hospice and should get meds next week. Pt states he is withdrawal from Morphine.  Pt is on 3 L 02.  Pt was seen by PCP on 8/30

## 2017-07-26 NOTE — ED Triage Notes (Addendum)
Patient reports mid back pain worse with deep inspiration , he was just seen here and discharged home .

## 2017-07-27 ENCOUNTER — Emergency Department (HOSPITAL_COMMUNITY)

## 2017-07-27 ENCOUNTER — Encounter (HOSPITAL_COMMUNITY): Payer: Self-pay | Admitting: *Deleted

## 2017-07-27 ENCOUNTER — Emergency Department (HOSPITAL_COMMUNITY)
Admission: EM | Admit: 2017-07-27 | Discharge: 2017-07-27 | Disposition: A | Discharge status: Home / Self Care | Attending: Emergency Medicine | Admitting: Emergency Medicine

## 2017-07-27 DIAGNOSIS — I110 Hypertensive heart disease with heart failure: Secondary | ICD-10-CM | POA: Diagnosis not present

## 2017-07-27 DIAGNOSIS — R0789 Other chest pain: Secondary | ICD-10-CM | POA: Insufficient documentation

## 2017-07-27 DIAGNOSIS — E119 Type 2 diabetes mellitus without complications: Secondary | ICD-10-CM | POA: Insufficient documentation

## 2017-07-27 DIAGNOSIS — J45909 Unspecified asthma, uncomplicated: Secondary | ICD-10-CM | POA: Insufficient documentation

## 2017-07-27 DIAGNOSIS — F17210 Nicotine dependence, cigarettes, uncomplicated: Secondary | ICD-10-CM | POA: Diagnosis not present

## 2017-07-27 DIAGNOSIS — Z7901 Long term (current) use of anticoagulants: Secondary | ICD-10-CM | POA: Insufficient documentation

## 2017-07-27 DIAGNOSIS — Z79899 Other long term (current) drug therapy: Secondary | ICD-10-CM | POA: Diagnosis not present

## 2017-07-27 DIAGNOSIS — Z794 Long term (current) use of insulin: Secondary | ICD-10-CM | POA: Diagnosis not present

## 2017-07-27 DIAGNOSIS — I509 Heart failure, unspecified: Secondary | ICD-10-CM | POA: Insufficient documentation

## 2017-07-27 DIAGNOSIS — J449 Chronic obstructive pulmonary disease, unspecified: Secondary | ICD-10-CM | POA: Insufficient documentation

## 2017-07-27 DIAGNOSIS — Z8521 Personal history of malignant neoplasm of larynx: Secondary | ICD-10-CM | POA: Insufficient documentation

## 2017-07-27 LAB — I-STAT TROPONIN, ED: TROPONIN I, POC: 0.01 ng/mL (ref 0.00–0.08)

## 2017-07-27 LAB — TROPONIN I: Troponin I: <0.03 ng/mL (ref <0.03)

## 2017-07-27 MED ORDER — ALPRAZOLAM 0.5 MG PO TABS
1.0000 mg | ORAL_TABLET | Freq: Once | ORAL | Status: AC
  Administered 2017-07-27: 1 mg via ORAL
  Filled 2017-07-27: qty 2

## 2017-07-27 MED ORDER — NITROGLYCERIN 0.4 MG SL SUBL
0.4000 mg | SUBLINGUAL_TABLET | SUBLINGUAL | Status: DC | PRN
  Administered 2017-07-27: 0.4 mg via SUBLINGUAL
  Filled 2017-07-27: qty 1

## 2017-07-27 NOTE — ED Notes (Signed)
From radiology 

## 2017-07-27 NOTE — ED Notes (Signed)
Dr Roderic Palau in to assess

## 2017-07-27 NOTE — Discharge Instructions (Signed)
As discussed, please make sure you stay well-hydrated. The number one step in helping with your COPD symptoms is smoking cessation as discussed today. Talk to your primary care provider and pulmonologist for help with smoking cessation.  Go to your appointment with pulmonology.  Return if symptoms worsen or you experience new concerning symptoms in the meantime.

## 2017-07-27 NOTE — ED Notes (Signed)
Dr Earnest Conroy in to reassess

## 2017-07-27 NOTE — ED Provider Notes (Signed)
[facility] DEPT Provider Note   CSN: [id number] Arrival date & time: 07/26/17  2232     History   Chief Complaint Chief Complaint  Patient presents with  . Back Pain    HPI Roger Flores is a 56 y.o. male on hospice for end-stage COPD presenting with continued discomfort. He was just discharged from the ER for COPD exacerbation and reports that nothing was done for him and he still doesn't know what is wrong with him and wants it fixed. Patient is reporting chronic coughing and has an upcoming appointment with pulmonology. He is describing pleuritic pain with deep inhales to his posterior rib cage bilaterally. He reports that he is a current every day smoker with half a pack a day.  HPI  Past Medical History:  Diagnosis Date  . Anxiety   . Arthritis    knees and back  . Asthma   . Cancer ([place])    Larynx  . Cataract   . CHF (congestive heart failure) ([place])   . COPD (chronic obstructive pulmonary disease) ([place])   . Depression   . Diabetes mellitus without complication ([place])   . Emphysema of lung ([place])   . GERD (gastroesophageal reflux disease)   . Hyperlipidemia   . Hypertension   . Kidney disease   . Liver disease   . Lung mass   . Myocardial infarction ([place])   . Opioid withdrawal ([place])   . Oxygen deficiency   . Stroke [facility])     Patient Active Problem List   Diagnosis Date Noted  . Chronic pain 07/24/2017  . Stroke ([place]) 07/21/2017  . History of cancer of larynx 07/21/2017  . Self-catheterizes urinary bladder 07/21/2017  . 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 DNR no code (do not resuscitate) 07/21/2017  . Insulin dependent diabetes mellitus ([place]) 07/21/2017  . Pressure injury of skin 07/19/2017  . Chest pain 07/18/2017  . COPD, very severe ([place]) 07/18/2017  . Unstable angina pectoris ([place]) 07/01/2017    Past Surgical History:  Procedure Laterality Date  . NECK SURGERY    . THROAT SURGERY         Home Medications    Prior to  Admission medications   Medication Sig Start Date End Date Taking? Authorizing Provider  albuterol (PROVENTIL) (2.5 MG/3ML) 0.083% nebulizer solution Take 3 mLs (2.5 mg total) by nebulization every 6 (six) hours as needed for wheezing or shortness of breath. 07/20/17   Kathie Dike, MD  apixaban (ELIQUIS) 5 MG TABS tablet Take 1 tablet (5 mg total) by mouth 2 (two) times daily. 07/04/17   Jola Schmidt, MD  docusate sodium (COLACE) 100 MG capsule Take 100 mg by mouth 2 (two) times daily as needed for constipation.    [provider]  feeding supplement, GLUCERNA SHAKE, (GLUCERNA SHAKE) LIQD Take 237 mLs by mouth 2 (two) times daily.     [provider]  furosemide (LASIX) 40 MG tablet Take 40 mg by mouth daily as needed for fluid.    [provider]  insulin aspart (NOVOLOG) 100 UNIT/ML injection Inject 8-13 Units into the skin 4 (four) times daily. 8 units before each meal and 13 units at bedtime.    [provider]  LORazepam (ATIVAN) 0.5 MG tablet Take 1 tablet (0.5 mg total) by mouth every 8 (eight) hours as needed for anxiety. 07/22/17   Duffy Bruce, MD  morphine (MS CONTIN) 30 MG 12 hr tablet Take 1 tablet (30 mg total) by  mouth every 6 (six) hours. 07/22/17   Duffy Bruce, MD  morphine (MSIR) 15 MG tablet Take 1 tablet (15 mg total) by mouth every 6 (six) hours as needed for severe pain. 07/22/17   Duffy Bruce, MD  nitroGLYCERIN (NITROSTAT) 0.4 MG SL tablet Place 1 tablet under the tongue daily as needed for chest pain. 05/23/17   [provider]  OXYGEN Inhale 3 L into the lungs daily. At home    [provider]  predniSONE (DELTASONE) 10 MG tablet Take 40mg  po daily for 2 days then 30mg  daily for 2 days then 20mg  daily for 2 days then 10mg  daily for 2 days then stop 07/20/17   Kathie Dike, MD  predniSONE (DELTASONE) 20 MG tablet Take 2 tablets (40 mg total) by mouth daily with breakfast. 07/26/17 07/31/17  Lajean Saver, MD    prochlorperazine (COMPAZINE) 5 MG tablet Take 5 mg by mouth every 6 (six) hours as needed for nausea or vomiting.    [provider]  tamsulosin (FLOMAX) 0.4 MG CAPS capsule Take 0.4 mg by mouth daily.    [provider]    Family History Family History  Problem Relation Age of Onset  . Arthritis Mother   . Asthma Mother   . COPD Mother   . Depression Mother   . Hyperlipidemia Mother   . Hypertension Mother   . Miscarriages / [place] Mother   . Alzheimer's disease Mother   . Alcohol abuse Father   . Drug abuse Father   . Early death Father 97       gunshot  . Heart disease Brother 34  . Early death Son        SIDS  . Early death Son        MVA    Social History Social History  Substance Use Topics  . Smoking status: Current Every Day Smoker    Packs/day: 3.00    Types: Cigarettes    Start date: 11/25/1977  . Smokeless tobacco: Never Used  . Alcohol use No     Allergies   Patient has no known allergies.   Review of Systems Review of Systems  Constitutional: Negative for chills and fever.  HENT: Positive for congestion. Negative for ear pain and sore throat.   Eyes: Negative for pain and visual disturbance.  Respiratory: Positive for cough. Negative for choking and shortness of breath.        Productive sputum blood tinged which has been chronic per patient no new or increased sputum.  Cardiovascular: Negative for chest pain and palpitations.  Gastrointestinal: Negative for abdominal pain and vomiting.  Genitourinary: Negative for dysuria and hematuria.  Musculoskeletal: Positive for back pain. Negative for arthralgias.  Skin: Negative for color change, pallor and rash.  Neurological: Negative for seizures and syncope.     Physical Exam Updated Vital Signs BP (!) 135/96 (BP Location: Right Arm)   Pulse (!) 109   Temp 97.8 F (36.6 C) (Oral)   Resp 18   SpO2 98%   Physical Exam  Constitutional: He appears well-developed and  well-nourished. No distress.  Afebrile, nontoxic-appearing, sitting comfortable in bed in no acute distress.  HENT:  Head: Normocephalic and atraumatic.  Eyes: Conjunctivae and EOM are normal.  Neck: Normal range of motion. Neck supple.  Cardiovascular: Normal rate, regular rhythm, normal heart sounds and intact distal pulses.   No murmur heard. Pulmonary/Chest: Effort normal and breath sounds normal. No respiratory distress. He has no wheezes. He has no  rales. He exhibits no tenderness.  Abdominal: He exhibits no distension.  Musculoskeletal: He exhibits no edema.  Neurological: He is alert.  Skin: Skin is warm and dry. No rash noted. He is not diaphoretic. No erythema. No pallor.  Psychiatric: He has a normal mood and affect.  Nursing note and vitals reviewed.    ED Treatments / Results  Labs (all labs ordered are listed, but only abnormal results are displayed) Labs Reviewed - No data to display  EKG  EKG Interpretation None       Radiology Dg Chest [facility] 1 View  Result Date: 07/26/2017 CLINICAL DATA:  Cough and COPD EXAM: PORTABLE CHEST 1 VIEW COMPARISON:  07/18/2017 FINDINGS: Minimal linear atelectasis at the left base. No focal consolidation or effusion. Normal heart size. No pneumothorax. IMPRESSION: No active disease. Electronically Signed   By: Donavan Foil M.D.   On: 07/26/2017 21:00    Procedures Procedures (including critical care time)  Medications Ordered in ED Medications - No data to display   Initial Impression / Assessment and Plan / ED Course  I have reviewed the triage vital signs and the nursing notes.  Pertinent labs & imaging results that were available during my care of the patient were reviewed by me and considered in my medical decision making (see chart for details).     Patient returning after being discharged from the emergency department with acute COPD exacerbation reporting that he is still experiencing discomfort with deep inspiration  and wants to know what is wrong with him.  Patient was breathing normally and satting at 98% on 3 L nasal cannula which is his home requirement. Patient actively began to make wheezing/grunting noises while breathing during my assessment.  Patient was discussed with Dr. Stark Jock who did not recommend CT chest given recent ct chest.   Chest x-ray negative. Lungs are clear to auscultation bilaterally. He is well-appearing, nontoxic and afebrile. Patient was not tachycardic on my exam.  Patient declined any further imaging today and states that he will be going to [facility] as he needs a team of specialists to assess him and tell him why he is having pain and fix it.  Had a long discussion with patient about smoking cessation. Explained that the first step in improving his symptoms with COPD is smoking cessation.  Discharge with close follow-up with PCP in pulmonology.  Discussed strict return precautions and advised to return to the emergency department if experiencing any new or worsening symptoms. Instructions were understood and patient agreed with discharge plan.  Final Clinical Impressions(s) / ED Diagnoses   Final diagnoses:  Rib pain  Muscle strain    New Prescriptions Discharge Medication List as of 07/27/2017 12:19 AM       Emeline General, PA-C 07/27/17 0932    Veryl Speak, MD 07/27/17 2251

## 2017-07-27 NOTE — ED Notes (Signed)
Patient left the ER without waiting for his discharge instructions .

## 2017-07-27 NOTE — ED Notes (Signed)
After DrZ states pt can take home meds  Pt reports that he takes Xanax 1 mg but does not have with him  He reports that he is out of morphine, but has xanax at home

## 2017-07-27 NOTE — ED Notes (Signed)
Dr Z in to reassess and discuss findings 

## 2017-07-27 NOTE — ED Notes (Signed)
Pt reports chest pain unrelieved by 3 NTG  Seen at [facility] last pm  Reports no cardiologist  Home O2  Continues to smoke 1/2 PPD

## 2017-07-27 NOTE — Discharge Instructions (Signed)
Follow-up with you md as planned

## 2017-07-27 NOTE — ED Notes (Signed)
Pt called out and ask if he could take his own NTG.   Advised pt not to take any of his medicines while here in the ED.  Dr. Roderic Palau notified.

## 2017-07-27 NOTE — ED Provider Notes (Signed)
[place] DEPT Provider Note   CSN: [id number] Arrival date & time: 07/27/17  1659     History   Chief Complaint Chief Complaint  Patient presents with  . Chest Pain    HPI Roger Flores is a 56 y.o. male.  Patient complains of chest pain today. He took some nitroglycerin is at home and that did help. Patient has been seen in emergency department 3 times last 24 hours.   The history is provided by the patient.  Chest Pain   This is a recurrent problem. The current episode started 1 to 2 hours ago. The problem occurs rarely. The problem has been resolved. The pain is associated with movement. The pain is present in the substernal region. The pain is at a severity of 3/10. The pain is moderate. The quality of the pain is described as dull. Pertinent negatives include no abdominal pain, no back pain, no cough and no headaches.  Pertinent negatives for past medical history include no seizures.    Past Medical History:  Diagnosis Date  . Anxiety   . Arthritis    knees and back  . Asthma   . Cancer ([place])    Larynx  . Cataract   . CHF (congestive heart failure) ([place])   . COPD (chronic obstructive pulmonary disease) ([place])   . Depression   . Diabetes mellitus without complication ([place])   . Emphysema of lung ([place])   . GERD (gastroesophageal reflux disease)   . Hyperlipidemia   . Hypertension   . Kidney disease   . Liver disease   . Lung mass   . Myocardial infarction ([place])   . Opioid withdrawal ([place])   . Oxygen deficiency   . Stroke [facility])     Patient Active Problem List   Diagnosis Date Noted  . Chronic pain 07/24/2017  . Stroke ([place]) 07/21/2017  . History of cancer of larynx 07/21/2017  . Self-catheterizes urinary bladder 07/21/2017  . 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 DNR no code (do not resuscitate) 07/21/2017  . Insulin dependent diabetes mellitus ([place]) 07/21/2017  . Pressure injury of skin 07/19/2017  . Chest pain 07/18/2017  . COPD,  very severe ([place]) 07/18/2017  . Unstable angina pectoris ([place]) 07/01/2017    Past Surgical History:  Procedure Laterality Date  . NECK SURGERY    . THROAT SURGERY         Home Medications    Prior to Admission medications   Medication Sig Start Date End Date Taking? Authorizing Provider  albuterol (PROVENTIL) (2.5 MG/3ML) 0.083% nebulizer solution Take 3 mLs (2.5 mg total) by nebulization every 6 (six) hours as needed for wheezing or shortness of breath. 07/20/17  Yes Kathie Dike, MD  apixaban (ELIQUIS) 5 MG TABS tablet Take 1 tablet (5 mg total) by mouth 2 (two) times daily. 07/04/17  Yes Jola Schmidt, MD  docusate sodium (COLACE) 100 MG capsule Take 100 mg by mouth 2 (two) times daily as needed for constipation.   Yes [provider]  feeding supplement, GLUCERNA SHAKE, (GLUCERNA SHAKE) LIQD Take 237 mLs by mouth 2 (two) times daily.    Yes [provider]  furosemide (LASIX) 40 MG tablet Take 40 mg by mouth daily as needed for fluid.   Yes [provider]  insulin aspart (NOVOLOG) 100 UNIT/ML injection Inject 8-13 Units into the skin 4 (four) times daily. 8 units before each meal and 13 units at bedtime.   Yes [provider]  LORazepam (ATIVAN) 0.5 MG tablet Take 1 tablet (0.5 mg total) by mouth every 8 (eight) hours as needed for anxiety. 07/22/17  Yes Duffy Bruce, MD  morphine (MS CONTIN) 30 MG 12 hr tablet Take 1 tablet (30 mg total) by mouth every 6 (six) hours. 07/22/17  Yes Duffy Bruce, MD  morphine (MSIR) 15 MG tablet Take 1 tablet (15 mg total) by mouth every 6 (six) hours as needed for severe pain. 07/22/17  Yes Duffy Bruce, MD  nitroGLYCERIN (NITROSTAT) 0.4 MG SL tablet Place 1 tablet under the tongue daily as needed for chest pain. 05/23/17  Yes [provider]  OXYGEN Inhale 3 L into the lungs daily. At home   Yes [provider]  predniSONE (DELTASONE) 10 MG tablet Take 40mg  po daily for 2 days then 30mg   daily for 2 days then 20mg  daily for 2 days then 10mg  daily for 2 days then stop Patient taking differently: Take 10-30 mg by mouth See admin instructions. Take 40mg  po daily for 2 days then 30mg  daily for 2 days then 20mg  daily for 2 days then 10mg  daily for 2 days then stop 07/20/17  Yes Kathie Dike, MD  prochlorperazine (COMPAZINE) 5 MG tablet Take 5 mg by mouth every 6 (six) hours as needed for nausea or vomiting.   Yes [provider]  tamsulosin (FLOMAX) 0.4 MG CAPS capsule Take 0.4 mg by mouth daily.   Yes [provider]  predniSONE (DELTASONE) 20 MG tablet Take 2 tablets (40 mg total) by mouth daily with breakfast. 07/26/17 07/31/17  Lajean Saver, MD    Family History Family History  Problem Relation Age of Onset  . Arthritis Mother   . Asthma Mother   . COPD Mother   . Depression Mother   . Hyperlipidemia Mother   . Hypertension Mother   . Miscarriages / [place] Mother   . Alzheimer's disease Mother   . Alcohol abuse Father   . Drug abuse Father   . Early death Father 60       gunshot  . Heart disease Brother 82  . Early death Son        SIDS  . Early death Son        MVA    Social History Social History  Substance Use Topics  . Smoking status: Current Every Day Smoker    Packs/day: 3.00    Types: Cigarettes    Start date: 11/25/1977  . Smokeless tobacco: Never Used  . Alcohol use No     Allergies   Patient has no known allergies.   Review of Systems Review of Systems  Constitutional: Negative for appetite change and fatigue.  HENT: Negative for congestion, ear discharge and sinus pressure.   Eyes: Negative for discharge.  Respiratory: Negative for cough.   Cardiovascular: Positive for chest pain.  Gastrointestinal: Negative for abdominal pain and diarrhea.  Genitourinary: Negative for frequency and hematuria.  Musculoskeletal: Negative for back pain.  Skin: Negative for rash.  Neurological: Negative for seizures and headaches.    Psychiatric/Behavioral: Negative for hallucinations.     Physical Exam Updated Vital Signs BP (!) 132/94   Pulse 96   Temp 98.5 F (36.9 C) (Oral)   Resp (!) 25   Ht 5\' 6"  (1.676 m)   Wt 96.6 kg (213 lb)   SpO2 99%   BMI 34.38 kg/m   Physical Exam  Constitutional: He is oriented to person, place, and time. He appears well-developed.  HENT:  Head:  Normocephalic.  Eyes: Conjunctivae and EOM are normal. No scleral icterus.  Neck: Neck supple. No thyromegaly present.  Cardiovascular: Normal rate and regular rhythm.  Exam reveals no gallop and no friction rub.   No murmur heard. Pulmonary/Chest: No stridor. He has no wheezes. He has no rales. He exhibits no tenderness.  Abdominal: He exhibits no distension. There is no tenderness. There is no rebound.  Musculoskeletal: Normal range of motion. He exhibits no edema.  Lymphadenopathy:    He has no cervical adenopathy.  Neurological: He is oriented to person, place, and time. He exhibits normal muscle tone. Coordination normal.  Skin: No rash noted. No erythema.  Psychiatric: He has a normal mood and affect. His behavior is normal.     ED Treatments / Results  Labs (all labs ordered are listed, but only abnormal results are displayed) Labs Reviewed  TROPONIN I  TROPONIN I  I-STAT TROPONIN, ED  I-STAT CHEM 8, ED    EKG  EKG Interpretation  Date/Time:  Sunday July 27 2017 17:22:48 EDT Ventricular Rate:  113 PR Interval:    QRS Duration: 125 QT Interval:  335 QTC Calculation: 460 R Axis:   82 Text Interpretation:  Sinus tachycardia Paired ventricular premature complexes Right bundle branch block Lateral infarct, acute ST elevation, consider inferior injury Baseline wander in lead(s) V2 Partial missing lead(s): V2 Confirmed by Milton Ferguson [phone]) on 07/27/2017 5:43:22 PM       Radiology Dg Chest 2 View  Result Date: 07/27/2017 CLINICAL DATA:  Mid chest pain radiating to the left jaw. EXAM: CHEST  2 VIEW  COMPARISON:  July 26, 2017 FINDINGS: The heart size and mediastinal contours are within normal limits. There is mild increased pulmonary interstitium bilaterally. No focal pneumonia is identified. There is probable minimal posterior pleural effusion. The visualized skeletal structures are unremarkable. IMPRESSION: Mild increased pulmonary interstitium bilaterally which could be due to mild interstitial edema. Electronically Signed   By: Abelardo Diesel M.D.   On: 07/27/2017 18:54   Dg Chest Port 1 View  Result Date: 07/26/2017 CLINICAL DATA:  Cough and COPD EXAM: PORTABLE CHEST 1 VIEW COMPARISON:  07/18/2017 FINDINGS: Minimal linear atelectasis at the left base. No focal consolidation or effusion. Normal heart size. No pneumothorax. IMPRESSION: No active disease. Electronically Signed   By: Donavan Foil M.D.   On: 07/26/2017 21:00    Procedures Procedures (including critical care time)  Medications Ordered in ED Medications  nitroGLYCERIN (NITROSTAT) SL tablet 0.4 mg (0.4 mg Sublingual Given 07/27/17 1800)  ALPRAZolam (XANAX) tablet 1 mg (1 mg Oral Given 07/27/17 1924)     Initial Impression / Assessment and Plan / ED Course  I have reviewed the triage vital signs and the nursing notes.  Pertinent labs & imaging results that were available during my care of the patient were reviewed by me and considered in my medical decision making (see chart for details).     Patient's cardiac enzymes were negative. Chest x-ray is negative. EKG shows no acute changes. Patient has had a catheterization in the last year that did not show any significant disease. I do not think his chest pain is cardiac related and I feel comfortable discharging him to be followed up  Final Clinical Impressions(s) / ED Diagnoses   Final diagnoses:  Atypical chest pain    New Prescriptions New Prescriptions   No medications on file     Milton Ferguson, MD 07/27/17 2034

## 2017-07-27 NOTE — ED Triage Notes (Signed)
Pt c/o mid chest pain that radiates into left jaw, SOB, dizziness and diaphoresis that started about 1 hour ago after falling out of his bed. Pt denies LOC. Pt reports he landed on his right knee upon fall. Pt also c/o SOB that worsened when the chest pain started. Pt wears 3L O2 via [place] continuously for his COPD.

## 2017-07-27 NOTE — ED Notes (Signed)
Pt unhappy with wait- increasingly terse- advised that he is awaiting 2nd troponin, what it measures and why

## 2017-07-29 ENCOUNTER — Emergency Department (HOSPITAL_COMMUNITY)

## 2017-07-29 ENCOUNTER — Encounter (HOSPITAL_COMMUNITY): Payer: Self-pay | Admitting: Emergency Medicine

## 2017-07-29 ENCOUNTER — Emergency Department (HOSPITAL_COMMUNITY)
Admission: EM | Admit: 2017-07-29 | Discharge: 2017-07-29 | Disposition: A | Discharge status: Home / Self Care | Attending: Emergency Medicine | Admitting: Emergency Medicine

## 2017-07-29 DIAGNOSIS — F17210 Nicotine dependence, cigarettes, uncomplicated: Secondary | ICD-10-CM | POA: Insufficient documentation

## 2017-07-29 DIAGNOSIS — J441 Chronic obstructive pulmonary disease with (acute) exacerbation: Secondary | ICD-10-CM | POA: Insufficient documentation

## 2017-07-29 DIAGNOSIS — R0602 Shortness of breath: Secondary | ICD-10-CM | POA: Diagnosis present

## 2017-07-29 DIAGNOSIS — I110 Hypertensive heart disease with heart failure: Secondary | ICD-10-CM | POA: Insufficient documentation

## 2017-07-29 DIAGNOSIS — E119 Type 2 diabetes mellitus without complications: Secondary | ICD-10-CM | POA: Insufficient documentation

## 2017-07-29 DIAGNOSIS — Z8521 Personal history of malignant neoplasm of larynx: Secondary | ICD-10-CM | POA: Diagnosis not present

## 2017-07-29 DIAGNOSIS — Z794 Long term (current) use of insulin: Secondary | ICD-10-CM | POA: Insufficient documentation

## 2017-07-29 DIAGNOSIS — I509 Heart failure, unspecified: Secondary | ICD-10-CM | POA: Diagnosis not present

## 2017-07-29 DIAGNOSIS — J45909 Unspecified asthma, uncomplicated: Secondary | ICD-10-CM | POA: Insufficient documentation

## 2017-07-29 LAB — CBG MONITORING, ED: GLUCOSE-CAPILLARY: 307 mg/dL — AB (ref 65–99)

## 2017-07-29 MED ORDER — IPRATROPIUM BROMIDE 0.02 % IN SOLN: 1.0000 mg | Freq: Once | RESPIRATORY_TRACT | Status: DC

## 2017-07-29 MED ORDER — OXYCODONE-ACETAMINOPHEN 5-325 MG PO TABS
2.0000 | ORAL_TABLET | Freq: Once | ORAL | Status: AC
  Administered 2017-07-29: 2 via ORAL
  Filled 2017-07-29: qty 2

## 2017-07-29 MED ORDER — PREDNISONE 50 MG PO TABS
60.0000 mg | ORAL_TABLET | Freq: Once | ORAL | Status: AC
  Administered 2017-07-29: 18:00:00 60 mg via ORAL
  Filled 2017-07-29: qty 1

## 2017-07-29 MED ORDER — MORPHINE SULFATE 15 MG PO TABS: 30.0000 mg | ORAL_TABLET | ORAL | 0 refills | Status: DC | PRN

## 2017-07-29 MED ORDER — ALBUTEROL (5 MG/ML) CONTINUOUS INHALATION SOLN: 10.0000 mg/h | INHALATION_SOLUTION | RESPIRATORY_TRACT | Status: DC

## 2017-07-29 NOTE — ED Provider Notes (Signed)
Emergency Department Provider Note   I have reviewed the triage vital signs and the nursing notes.   HISTORY  Chief Complaint Shortness of Breath   HPI Melroy [place] is a 56 y.o. male on hospice secondary to CHF, COPD, diabetes, some type of lung mass and recently diagnosed pulmonary embolus. He presents emergency department today secondary to acute onset of worsening shortness of breath. States it feels like previous COPD exacerbations. Try some breathing treatments at home which did not seem to help so came here for further evaluation. Prior to my examination the patient and her to had a breathing treatment and stated it made him feel better.did have some right-sided chest pain in the area of the pain he felt with his PE in the past. He states he does not want significant workup in the emergency department he just wants pain medicine and breathing treatments to get his breathing better and then he wants to go home. He is a DO NOT RESUSCITATE he is competent make this decision and thus I will treat him appropriately.   Past Medical History:  Diagnosis Date  . Anxiety   . Arthritis    knees and back  . Asthma   . Cancer ([place])    Larynx  . Cataract   . CHF (congestive heart failure) ([place])   . COPD (chronic obstructive pulmonary disease) ([place])   . Depression   . Diabetes mellitus without complication ([place])   . Emphysema of lung ([place])   . GERD (gastroesophageal reflux disease)   . Hyperlipidemia   . Hypertension   . Kidney disease   . Liver disease   . Lung mass   . Myocardial infarction ([place])   . Opioid withdrawal ([place])   . Oxygen deficiency   . Stroke [facility])     Patient Active Problem List   Diagnosis Date Noted  . Chronic pain 07/24/2017  . Stroke ([place]) 07/21/2017  . History of cancer of larynx 07/21/2017  . Self-catheterizes urinary bladder 07/21/2017  . 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 DNR no code (do not resuscitate) 07/21/2017  . Insulin  dependent diabetes mellitus ([place]) 07/21/2017  . Pressure injury of skin 07/19/2017  . Chest pain 07/18/2017  . COPD, very severe ([place]) 07/18/2017  . Unstable angina pectoris ([place]) 07/01/2017    Past Surgical History:  Procedure Laterality Date  . NECK SURGERY    . THROAT SURGERY      Current Outpatient Rx  . Order #: [id number] Class: Print  . Order #: [id number] Class: Print  . Order #: [id number] Class: Historical Med  . Order #: [id number] Class: Historical Med  . Order #: [id number] Class: Historical Med  . Order #: [id number] Class: Historical Med  . Order #: [id number] Class: Print  . Order #: [id number] Class: Print  . Order #: [id number] Class: Historical Med  . Order #: [id number] Class: Historical Med  . Order #: [id number] Class: Print  . Order #: [id number] Class: Print  . Order #: [id number] Class: Historical Med  . Order #: [id number] Class: Historical Med  . Order #: [id number] Class: Print    Allergies Patient has no known allergies.  Family History  Problem Relation Age of Onset  . Arthritis Mother   . Asthma Mother   . COPD Mother   . Depression Mother   . Hyperlipidemia Mother   . Hypertension Mother   . Miscarriages / [place] Mother   . Alzheimer's disease Mother   . Alcohol abuse Father   . Drug abuse Father   . Early  death Father 72       gunshot  . Heart disease Brother 78  . Early death Son        SIDS  . Early death Son        MVA    Social History Social History  Substance Use Topics  . Smoking status: Current Every Day Smoker    Packs/day: 3.00    Types: Cigarettes    Start date: 11/25/1977  . Smokeless tobacco: Never Used  . Alcohol use No    Review of Systems  All other systems negative except as documented in the HPI. All pertinent positives and negatives as reviewed in the HPI. ____________________________________________  PHYSICAL EXAM:  VITAL SIGNS: ED Triage Vitals  Enc Vitals Group     BP 07/29/17 1610 (!) 145/81     Pulse Rate  07/29/17 1610 (!) 113     Resp 07/29/17 1610 (!) 24     Temp 07/29/17 1610 98.1 F (36.7 C)     Temp Source 07/29/17 1610 Oral     SpO2 07/29/17 1610 96 %     Weight 07/29/17 1610 213 lb (96.6 kg)     Height 07/29/17 1610 5\' 6"  (1.676 m)     Head Circumference --      Peak Flow --      Pain Score 07/29/17 1604 7     Pain Loc --      Pain Edu? --      Excl. in [place]? --     Constitutional: Alert and oriented. Well appearing and in no acute distress. Eyes: Conjunctivae are normal. PERRL. EOMI. Head: Atraumatic. Nose: No congestion/rhinnorhea. Mouth/Throat: Mucous membranes are moist.  Oropharynx non-erythematous. Neck: No stridor.  No meningeal signs.   Cardiovascular: tachycardic rate, regular rhythm. Good peripheral circulation. Grossly normal heart sounds.   Respiratory: tachypneic.  No retractions. Lungs CTAB. Gastrointestinal: Soft and nontender. No distention.  Musculoskeletal: No lower extremity tenderness nor edema. No gross deformities of extremities. Neurologic:  Normal speech and language. No gross focal neurologic deficits are appreciated.  Skin:  Skin is warm, dry and intact. No rash noted.  ____________________________________________   LABS (all labs ordered are listed, but only abnormal results are displayed)  Labs Reviewed  CBG MONITORING, ED - Abnormal; Notable for the following:       Result Value   Glucose-Capillary 307 (*)    All other components within normal limits   ____________________________________________  EKG   EKG Interpretation  Date/Time:    Ventricular Rate:    PR Interval:    QRS Duration:   QT Interval:    QTC Calculation:   R Axis:     Text Interpretation:         ____________________________________________  RADIOLOGY  Dg Chest 2 View  Result Date: 07/29/2017 CLINICAL DATA:  Mid chest pain radiating to the jaw EXAM: CHEST  2 VIEW COMPARISON:  07/28/2017 CT, 07/27/2017 FINDINGS: Streaky bibasilar opacity suggestive of  atelectasis or bronchitic changes. No large pleural effusion. Normal heart size. No pneumothorax. IMPRESSION: Streaky bibasilar opacity may reflect atelectasis and mild bronchitic change. Otherwise no significant interval change compared to prior Electronically Signed   By: Donavan Foil M.D.   On: 07/29/2017 17:50   ____________________________________________   PROCEDURES  Procedure(s) performed:   Procedures ____________________________________________   INITIAL IMPRESSION / ASSESSMENT AND PLAN / ED COURSE  Pertinent labs & imaging results that were available during my care of the patient were reviewed by me and considered  in my medical decision making (see chart for details).  Patient is quite insistent on being DO NOT RESUSCITATE and hospice patient and just wants to feel better. I suspect he may have worsening issues with his recent diagnosed blood clot versus a new worsening clot burden and treatment failure versus COPD exacerbation and pneumonia. Limited workup will include a chest x-ray, EKG, breathing treatments and steroids. I will reevaluate for improved symptoms and further disposition.  Reevaluation patient seemingly improved symptoms. We'll discharge home on prednisone and pain medication. Will follow up primary doctor for further prescriptions. No indication for further workup or imaging at this time.  ____________________________________________  FINAL CLINICAL IMPRESSION(S) / ED DIAGNOSES  Final diagnoses:  COPD exacerbation ([place])    MEDICATIONS GIVEN DURING THIS VISIT:  Medications  predniSONE (DELTASONE) tablet 60 mg (60 mg Oral Given 07/29/17 1744)  oxyCODONE-acetaminophen (PERCOCET/ROXICET) 5-325 MG per tablet 2 tablet (2 tablets Oral Given 07/29/17 1744)    NEW OUTPATIENT MEDICATIONS STARTED DURING THIS VISIT:  Discharge Medication List as of 07/29/2017  6:37 PM      Note:  This document was prepared using Dragon voice recognition software and may include  unintentional dictation errors.    Merrily Pew, MD 07/29/17 [phone]

## 2017-07-29 NOTE — ED Notes (Signed)
Pt refuses continuous ned. EDP aware.

## 2017-08-02 ENCOUNTER — Emergency Department: Payer: Medicare Other

## 2017-08-02 ENCOUNTER — Emergency Department
Admission: EM | Admit: 2017-08-02 | Discharge: 2017-08-03 | Disposition: A | Discharge status: Home / Self Care | Payer: Medicare Other | Attending: Emergency Medicine | Admitting: Emergency Medicine

## 2017-08-02 DIAGNOSIS — I252 Old myocardial infarction: Secondary | ICD-10-CM | POA: Diagnosis not present

## 2017-08-02 DIAGNOSIS — Z7901 Long term (current) use of anticoagulants: Secondary | ICD-10-CM | POA: Insufficient documentation

## 2017-08-02 DIAGNOSIS — I110 Hypertensive heart disease with heart failure: Secondary | ICD-10-CM | POA: Diagnosis not present

## 2017-08-02 DIAGNOSIS — Z79899 Other long term (current) drug therapy: Secondary | ICD-10-CM | POA: Diagnosis not present

## 2017-08-02 DIAGNOSIS — R0602 Shortness of breath: Secondary | ICD-10-CM | POA: Diagnosis present

## 2017-08-02 DIAGNOSIS — Z8521 Personal history of malignant neoplasm of larynx: Secondary | ICD-10-CM | POA: Diagnosis not present

## 2017-08-02 DIAGNOSIS — J45909 Unspecified asthma, uncomplicated: Secondary | ICD-10-CM | POA: Diagnosis not present

## 2017-08-02 DIAGNOSIS — F17200 Nicotine dependence, unspecified, uncomplicated: Secondary | ICD-10-CM | POA: Diagnosis not present

## 2017-08-02 DIAGNOSIS — E119 Type 2 diabetes mellitus without complications: Secondary | ICD-10-CM | POA: Diagnosis not present

## 2017-08-02 DIAGNOSIS — R079 Chest pain, unspecified: Secondary | ICD-10-CM | POA: Insufficient documentation

## 2017-08-02 DIAGNOSIS — Z794 Long term (current) use of insulin: Secondary | ICD-10-CM | POA: Diagnosis not present

## 2017-08-02 DIAGNOSIS — Z760 Encounter for issue of repeat prescription: Secondary | ICD-10-CM | POA: Diagnosis not present

## 2017-08-02 DIAGNOSIS — Z86711 Personal history of pulmonary embolism: Secondary | ICD-10-CM | POA: Insufficient documentation

## 2017-08-02 DIAGNOSIS — J441 Chronic obstructive pulmonary disease with (acute) exacerbation: Secondary | ICD-10-CM | POA: Insufficient documentation

## 2017-08-02 DIAGNOSIS — I509 Heart failure, unspecified: Secondary | ICD-10-CM | POA: Insufficient documentation

## 2017-08-02 LAB — COMPREHENSIVE METABOLIC PANEL
ALK PHOS: 68 U/L (ref 38–126)
ALT: 182 U/L — AB (ref 17–63)
AST: 97 U/L — ABNORMAL HIGH (ref 15–41)
Albumin: 3.7 g/dL (ref 3.5–5.0)
Anion gap: 8 (ref 5–15)
BUN: 14 mg/dL (ref 6–20)
CALCIUM: 9.4 mg/dL (ref 8.9–10.3)
CO2: 26 mmol/L (ref 22–32)
Chloride: 101 mmol/L (ref 101–111)
Creatinine, Ser: 0.7 mg/dL (ref 0.61–1.24)
GFR calc Af Amer: >60 mL/min (ref >60)
GLUCOSE: 368 mg/dL — AB (ref 65–99)
Potassium: 4.4 mmol/L (ref 3.5–5.1)
Sodium: 135 mmol/L (ref 135–145)
Total Bilirubin: 0.7 mg/dL (ref 0.3–1.2)
Total Protein: 7.6 g/dL (ref 6.5–8.1)

## 2017-08-02 LAB — CBC WITH DIFFERENTIAL/PLATELET
Basophils Absolute: 0.1 10*3/uL (ref 0–0.1)
Basophils Relative: 1 %
Eosinophils Absolute: 0.2 10*3/uL (ref 0–0.7)
Eosinophils Relative: 1 %
HEMATOCRIT: 50.8 % (ref 40.0–52.0)
HEMOGLOBIN: 17.2 g/dL (ref 13.0–18.0)
LYMPHS ABS: 3.2 10*3/uL (ref 1.0–3.6)
LYMPHS PCT: 28 %
MCH: 28.7 pg (ref 26.0–34.0)
MCHC: 33.8 g/dL (ref 32.0–36.0)
MCV: 84.9 fL (ref 80.0–100.0)
Monocytes Absolute: 1.1 10*3/uL — ABNORMAL HIGH (ref 0.2–1.0)
Monocytes Relative: 10 %
Neutro Abs: 6.9 10*3/uL — ABNORMAL HIGH (ref 1.4–6.5)
Neutrophils Relative %: 60 %
Platelets: 230 10*3/uL (ref 150–440)
RBC: 5.98 MIL/uL — AB (ref 4.40–5.90)
RDW: 15.4 % — ABNORMAL HIGH (ref 11.5–14.5)
WBC: 11.5 10*3/uL — AB (ref 3.8–10.6)

## 2017-08-02 MED ORDER — METHYLPREDNISOLONE SODIUM SUCC 125 MG IJ SOLR
125.0000 mg | Freq: Once | INTRAMUSCULAR | Status: AC
  Administered 2017-08-03: 125 mg via INTRAVENOUS
  Filled 2017-08-02: qty 2

## 2017-08-02 MED ORDER — MORPHINE SULFATE (PF) 4 MG/ML IV SOLN
4.0000 mg | Freq: Once | INTRAVENOUS | Status: AC
  Administered 2017-08-03: 4 mg via INTRAVENOUS
  Filled 2017-08-02: qty 1

## 2017-08-02 MED ORDER — MORPHINE SULFATE 15 MG PO TABS
30.0000 mg | ORAL_TABLET | Freq: Once | ORAL | Status: AC
  Administered 2017-08-03: 30 mg via ORAL
  Filled 2017-08-02: qty 2

## 2017-08-02 MED ORDER — IPRATROPIUM-ALBUTEROL 0.5-2.5 (3) MG/3ML IN SOLN
3.0000 mL | Freq: Once | RESPIRATORY_TRACT | Status: AC
  Administered 2017-08-03: 3 mL via RESPIRATORY_TRACT
  Filled 2017-08-02: qty 3

## 2017-08-02 NOTE — ED Notes (Signed)
Pt given oxygen tank to use instead of his own; says he wants to leave, "I can hurt at home"; encouraged pt to stay, says he has no pain medication at home; explained to pt that if he stays there is a possibility of pain relief, but not if he leaves; pt verbalized understanding and has decided to wait to see provider;

## 2017-08-02 NOTE — ED Notes (Signed)
Back to sub wait area to speak with patient; anxious and tired of waiting; encouraged pt to stay to see provider; pt agreed;

## 2017-08-02 NOTE — ED Provider Notes (Signed)
[facility] Emergency Department Provider Note   ____________________________________________   First MD Initiated Contact with Patient 08/02/17 2303     (approximate)  I have reviewed the triage vital signs and the nursing notes.   HISTORY  Chief Complaint Shortness of Breath    HPI Roger Flores is a 56 y.o. male who presents to the ED from home with a chief complaint of chest pain secondary to running out of pain medicine. Patient is on hospice secondary to COPD, on continuous oxygenation, also with a history of CHF, diabetes, lung mass and recently diagnosed PE, on Xarelto. He has been to multiple facilities over the past week for chest pain and shortness of breath. He wants limited evaluation done since he is on hospice. States he tried to fill a prescription for morphine yesterday but the pharmacy refused him because he had just picked up a prescription 2 weeks agoand also because the prescription he turned and had expired.Tells me he is starting with a new pain doctor on Wednesday and would like a limited prescription to get him through until then. States the pharmacy told him they would be able to fill his prescription today. Reports the same type of chest pain he has been having for the past couple weeks. Recently finished prednisone taper for COPD exacerbation. Noted wheezing returning today. Reports his blood sugars have been high secondary to recent steroids. Denies associated fever, chills, abdominal pain, nausea, vomiting, diarrhea. Denies recent travel or trauma.    Past Medical History:  Diagnosis Date  . Anxiety   . Arthritis    knees and back  . Asthma   . Cancer ([place])    Larynx  . Cataract   . CHF (congestive heart failure) ([place])   . COPD (chronic obstructive pulmonary disease) ([place])   . Depression   . Diabetes mellitus without complication ([place])   . Emphysema of lung ([place])   . GERD (gastroesophageal reflux disease)   .  Hyperlipidemia   . Hypertension   . Kidney disease   . Liver disease   . Lung mass   . Myocardial infarction ([place])   . Opioid withdrawal ([place])   . Oxygen deficiency   . Stroke [facility])     Patient Active Problem List   Diagnosis Date Noted  . Chronic pain 07/24/2017  . Stroke ([place]) 07/21/2017  . History of cancer of larynx 07/21/2017  . Self-catheterizes urinary bladder 07/21/2017  . 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 DNR no code (do not resuscitate) 07/21/2017  . Insulin dependent diabetes mellitus ([place]) 07/21/2017  . Pressure injury of skin 07/19/2017  . Chest pain 07/18/2017  . COPD, very severe ([place]) 07/18/2017  . Unstable angina pectoris ([place]) 07/01/2017    Past Surgical History:  Procedure Laterality Date  . NECK SURGERY    . THROAT SURGERY      Prior to Admission medications   Medication Sig Start Date End Date Taking? Authorizing Provider  albuterol (PROVENTIL) (2.5 MG/3ML) 0.083% nebulizer solution Take 3 mLs (2.5 mg total) by nebulization every 6 (six) hours as needed for wheezing or shortness of breath. 07/20/17  Yes Kathie Dike, MD  apixaban (ELIQUIS) 5 MG TABS tablet Take 1 tablet (5 mg total) by mouth 2 (two) times daily. 07/04/17  Yes Jola Schmidt, MD  docusate sodium (COLACE) 100 MG capsule Take 100 mg by mouth 2 (two) times daily as needed for constipation.   Yes [provider]  furosemide (LASIX)  40 MG tablet Take 40 mg by mouth daily as needed for fluid.   Yes [provider]  insulin aspart (NOVOLOG) 100 UNIT/ML injection Inject 8-13 Units into the skin 4 (four) times daily. 8 units before each meal and 13 units at bedtime.   Yes [provider]  LORazepam (ATIVAN) 0.5 MG tablet Take 1 tablet (0.5 mg total) by mouth every 8 (eight) hours as needed for anxiety. 07/22/17  Yes Duffy Bruce, MD  morphine (MS CONTIN) 30 MG 12 hr tablet Take 1 tablet (30 mg total) by mouth every 6 (six) hours. 07/22/17  Yes Duffy Bruce, MD  morphine (MSIR) 15 MG tablet Take 2 tablets (30 mg total) by mouth every 4 (four) hours as needed for severe pain. 07/29/17  Yes Mesner, Corene Cornea, MD  nitroGLYCERIN (NITROSTAT) 0.4 MG SL tablet Place 1 tablet under the tongue daily as needed for chest pain. 05/23/17  Yes [provider]  predniSONE (DELTASONE) 10 MG tablet Take 40mg  po daily for 2 days then 30mg  daily for 2 days then 20mg  daily for 2 days then 10mg  daily for 2 days then stop Patient taking differently: Take 10-30 mg by mouth See admin instructions. Take 40mg  po daily for 2 days then 30mg  daily for 2 days then 20mg  daily for 2 days then 10mg  daily for 2 days then stop 07/20/17  Yes Kathie Dike, MD  prochlorperazine (COMPAZINE) 5 MG tablet Take 5 mg by mouth every 6 (six) hours as needed for nausea or vomiting.   Yes [provider]  tamsulosin (FLOMAX) 0.4 MG CAPS capsule Take 0.4 mg by mouth daily.   Yes [provider]  feeding supplement, GLUCERNA SHAKE, (GLUCERNA SHAKE) LIQD Take 237 mLs by mouth 2 (two) times daily.     [provider]  OXYGEN Inhale 3 L into the lungs daily. At home    [provider]    Allergies Patient has no known allergies.  Family History  Problem Relation Age of Onset  . Arthritis Mother   . Asthma Mother   . COPD Mother   . Depression Mother   . Hyperlipidemia Mother   . Hypertension Mother   . Miscarriages / [place] Mother   . Alzheimer's disease Mother   . Alcohol abuse Father   . Drug abuse Father   . Early death Father 39       gunshot  . Heart disease Brother 22  . Early death Son        SIDS  . Early death Son        MVA    Social History Social History  Substance Use Topics  . Smoking status: Current Every Day Smoker    Packs/day: 3.00    Types: Cigarettes    Start date: 11/25/1977  . Smokeless tobacco: Never Used  . Alcohol use No    Review of Systems  Constitutional: No fever/chills. Eyes: No visual  changes. ENT: No sore throat. Cardiovascular: positive for chest pain. Respiratory: positive for shortness of breath. Gastrointestinal: No abdominal pain.  No nausea, no vomiting.  No diarrhea.  No constipation. Genitourinary: Negative for dysuria. Musculoskeletal: Negative for back pain. Skin: Negative for rash. Neurological: Negative for headaches, focal weakness or numbness.   ____________________________________________   PHYSICAL EXAM:  VITAL SIGNS: ED Triage Vitals  Enc Vitals Group     BP 08/02/17 2008 (!) 127/91     Pulse Rate 08/02/17 2008 (!) 116     Resp 08/02/17 2008 Marland Kitchen)  26     Temp 08/02/17 2008 98.2 F (36.8 C)     Temp Source 08/02/17 2008 Oral     SpO2 08/02/17 2008 98 %     Weight --      Height --      Head Circumference --      Peak Flow --      Pain Score 08/02/17 2006 8     Pain Loc --      Pain Edu? --      Excl. in [place]? --     Constitutional: Alert and oriented. Uncomfortable appearing and in mild acute distress. Eyes: Conjunctivae are normal. PERRL. EOMI. Head: Atraumatic. Nose: No congestion/rhinnorhea. Mouth/Throat: Mucous membranes are moist.  Oropharynx non-erythematous. Neck: No stridor.   Cardiovascular: tachycardic rate, regular rhythm. Grossly normal heart sounds.  Good peripheral circulation. Respiratory: Normal respiratory effort.  No retractions. Lungs with scattered wheezing. Gastrointestinal: Soft and nontender. No distention. No abdominal bruits. No CVA tenderness. Musculoskeletal: No lower extremity tenderness nor edema.  No joint effusions. Neurologic:  Normal speech and language. No gross focal neurologic deficits are appreciated.  Skin:  Skin is warm, dry and intact. No rash noted. Psychiatric: Mood and affect are normal. Speech and behavior are normal.  ____________________________________________   LABS (all labs ordered are listed, but only abnormal results are displayed)  Labs Reviewed  CBC WITH DIFFERENTIAL/PLATELET  - Abnormal; Notable for the following:       Result Value   WBC 11.5 (*)    RBC 5.98 (*)    RDW 15.4 (*)    Neutro Abs 6.9 (*)    Monocytes Absolute 1.1 (*)    All other components within normal limits  COMPREHENSIVE METABOLIC PANEL - Abnormal; Notable for the following:    Glucose, Bld 368 (*)    AST 97 (*)    ALT 182 (*)    All other components within normal limits   ____________________________________________  EKG  ED ECG REPORT I, SUNG,JADE J, the attending physician, personally viewed and interpreted this ECG.   Date: 08/03/2017  EKG Time: 2005  Rate: 112  Rhythm: sinus tachycardia  Axis: RAD  Intervals:right bundle branch block  ST&T Change: nonspecific  ____________________________________________  RADIOLOGY  Dg Chest 2 View  Result Date: 08/02/2017 CLINICAL DATA:  Shortness of breath, hurts to breathe, history of pulmonary embolism, COPD, diabetes mellitus, CHF, nodules in lungs, laryngeal cancer EXAM: CHEST  2 VIEW COMPARISON:  None FINDINGS: Normal heart size, mediastinal contours, and pulmonary vascularity. Atherosclerotic calcification aorta. Central peribronchial thickening. Minimal RIGHT basilar atelectasis. Additional opacities at the LEFT base may represent atelectasis as well though cannot completely exclude nodular density at LEFT base 15 x 9 mm diameter. Upper lungs clear. No definite acute infiltrate, pleural effusion or pneumothorax. Bones unremarkable. IMPRESSION: Bronchitic changes with bibasilar atelectasis. Cannot exclude a 15 x 9 mm diameter nodule at LEFT base though this could be related atelectasis; correlation with prior outside imaging would be of benefit in establishing stability of this finding. In the absence of previous exams, recommend CT chest to exclude pulmonary nodule. Electronically Signed   By: Lavonia Dana M.D.   On: 08/02/2017 20:49    ____________________________________________   PROCEDURES  Procedure(s) performed:  None  Procedures  Critical Care performed: No  ____________________________________________   INITIAL IMPRESSION / ASSESSMENT AND PLAN / ED COURSE  Pertinent labs & imaging results that were available during my care of the patient were reviewed by me and considered in my  medical decision making (see chart for details).  56 year old male under hospice care with COPD on continuous oxygen, CHF, lung mass, PE on Xarelto who presents with medication refill. Desires limited evaluation secondary to hospitalist. After long discussion with patient, wife and hospice nurse who is at bedside, will administer analgesia now, give DuoNeb, Solu-Medrol and reassess.  Clinical Course as of Aug 03 29  Sun Aug 03, 2017  0024 Patient feeling much better. Heart rate improved, currently 94. Wheezing cleared. 2L O2 saturations 98%. Will discharge home on prednisone burst times additional 4 days. Will write a 3 day prescription for immediate release morphine 30 mg every 6 hours. This should cover him until he sees his new pain doctor on Wednesday. Strict return precautions given. All verbalize understanding and agree with plan of care.  [JS]    Clinical Course User Index [JS] Paulette Blanch, MD     ____________________________________________   FINAL CLINICAL IMPRESSION(S) / ED DIAGNOSES  Final diagnoses:  COPD exacerbation ([place])  Chest pain, unspecified type  Medication refill      NEW MEDICATIONS STARTED DURING THIS VISIT:  New Prescriptions   No medications on file     Note:  This document was prepared using Dragon voice recognition software and may include unintentional dictation errors.    Paulette Blanch, MD 08/03/17 [id number]

## 2017-08-02 NOTE — ED Triage Notes (Signed)
Patient reports feeling short of breath and "hurts to breath".  Patient reports history of PE and nodes in his lung.  Patient is on 02 via nasal cannula at all times.  Patient reports being a Hospice patient.

## 2017-08-02 NOTE — ED Notes (Signed)
Pt's hospice nurse was aware pt was in the ED and has arrived to check on him;

## 2017-08-03 MED ORDER — MORPHINE SULFATE 30 MG PO TABS: 30.0000 mg | ORAL_TABLET | Freq: Four times a day (QID) | ORAL | 0 refills | Status: DC | PRN

## 2017-08-03 MED ORDER — PREDNISONE 20 MG PO TABS: ORAL_TABLET | ORAL | 0 refills | Status: DC

## 2017-08-03 NOTE — ED Notes (Signed)
Pt discharged to home.  Family member driving.  Discharge instructions reviewed.  Verbalized understanding.  No questions or concerns at this time.  Teach back verified.  Pt in NAD.  No items left in ED.   

## 2017-08-03 NOTE — Discharge Instructions (Signed)
1. Take prednisone 60 mg daily 4 days. Start your next dose on Monday. 2. Your Morphine prescription has been refilled for 3 days. 3. Return to the ER for worsening symptoms, persistent vomiting, difficulty breathing or other concerns.

## 2017-08-18 ENCOUNTER — Institutional Professional Consult (permissible substitution): Payer: Medicare Other | Admitting: Internal Medicine

## 2017-08-29 ENCOUNTER — Emergency Department (HOSPITAL_COMMUNITY)
Admission: EM | Admit: 2017-08-29 | Discharge: 2017-08-29 | Disposition: A | Discharge status: Home / Self Care | Attending: Emergency Medicine | Admitting: Emergency Medicine

## 2017-08-29 ENCOUNTER — Other Ambulatory Visit: Payer: Self-pay

## 2017-08-29 ENCOUNTER — Emergency Department (HOSPITAL_COMMUNITY): Admission: EM | Admit: 2017-08-29 | Discharge: 2017-08-29 | Discharge status: Against Medical Advice

## 2017-08-29 ENCOUNTER — Emergency Department (HOSPITAL_COMMUNITY)
Admission: EM | Admit: 2017-08-29 | Discharge: 2017-08-29 | Disposition: A | Discharge status: Home / Self Care | Source: Home / Self Care

## 2017-08-29 ENCOUNTER — Encounter (HOSPITAL_COMMUNITY): Payer: Self-pay | Admitting: Emergency Medicine

## 2017-08-29 ENCOUNTER — Emergency Department (HOSPITAL_COMMUNITY)

## 2017-08-29 DIAGNOSIS — R109 Unspecified abdominal pain: Secondary | ICD-10-CM | POA: Insufficient documentation

## 2017-08-29 DIAGNOSIS — Z5321 Procedure and treatment not carried out due to patient leaving prior to being seen by health care provider: Secondary | ICD-10-CM

## 2017-08-29 DIAGNOSIS — F17210 Nicotine dependence, cigarettes, uncomplicated: Secondary | ICD-10-CM | POA: Diagnosis not present

## 2017-08-29 DIAGNOSIS — R110 Nausea: Secondary | ICD-10-CM

## 2017-08-29 DIAGNOSIS — R05 Cough: Secondary | ICD-10-CM | POA: Insufficient documentation

## 2017-08-29 DIAGNOSIS — Z8521 Personal history of malignant neoplasm of larynx: Secondary | ICD-10-CM | POA: Diagnosis not present

## 2017-08-29 DIAGNOSIS — R61 Generalized hyperhidrosis: Secondary | ICD-10-CM | POA: Diagnosis not present

## 2017-08-29 DIAGNOSIS — R0602 Shortness of breath: Secondary | ICD-10-CM

## 2017-08-29 DIAGNOSIS — J45909 Unspecified asthma, uncomplicated: Secondary | ICD-10-CM | POA: Insufficient documentation

## 2017-08-29 DIAGNOSIS — I509 Heart failure, unspecified: Secondary | ICD-10-CM | POA: Insufficient documentation

## 2017-08-29 DIAGNOSIS — N189 Chronic kidney disease, unspecified: Secondary | ICD-10-CM | POA: Insufficient documentation

## 2017-08-29 DIAGNOSIS — R6883 Chills (without fever): Secondary | ICD-10-CM | POA: Diagnosis not present

## 2017-08-29 DIAGNOSIS — J449 Chronic obstructive pulmonary disease, unspecified: Secondary | ICD-10-CM | POA: Diagnosis not present

## 2017-08-29 DIAGNOSIS — I130 Hypertensive heart and chronic kidney disease with heart failure and stage 1 through stage 4 chronic kidney disease, or unspecified chronic kidney disease: Secondary | ICD-10-CM | POA: Diagnosis not present

## 2017-08-29 DIAGNOSIS — R112 Nausea with vomiting, unspecified: Secondary | ICD-10-CM | POA: Insufficient documentation

## 2017-08-29 DIAGNOSIS — E1122 Type 2 diabetes mellitus with diabetic chronic kidney disease: Secondary | ICD-10-CM | POA: Diagnosis not present

## 2017-08-29 LAB — COMPREHENSIVE METABOLIC PANEL
ALT: 136 U/L — AB (ref 17–63)
AST: 87 U/L — AB (ref 15–41)
Albumin: 3.9 g/dL (ref 3.5–5.0)
Alkaline Phosphatase: 80 U/L (ref 38–126)
Anion gap: 16 — ABNORMAL HIGH (ref 5–15)
BILIRUBIN TOTAL: 0.5 mg/dL (ref 0.3–1.2)
BUN: 11 mg/dL (ref 6–20)
CALCIUM: 10.2 mg/dL (ref 8.9–10.3)
CO2: 21 mmol/L — ABNORMAL LOW (ref 22–32)
CREATININE: 0.7 mg/dL (ref 0.61–1.24)
Chloride: 100 mmol/L — ABNORMAL LOW (ref 101–111)
GFR calc Af Amer: >60 mL/min (ref >60)
Glucose, Bld: 225 mg/dL — ABNORMAL HIGH (ref 65–99)
Potassium: 4.1 mmol/L (ref 3.5–5.1)
Sodium: 137 mmol/L (ref 135–145)
TOTAL PROTEIN: 8.8 g/dL — AB (ref 6.5–8.1)

## 2017-08-29 LAB — CBC
HCT: 53.3 % — ABNORMAL HIGH (ref 39.0–52.0)
Hemoglobin: 18.3 g/dL — ABNORMAL HIGH (ref 13.0–17.0)
MCH: 28.9 pg (ref 26.0–34.0)
MCHC: 34.3 g/dL (ref 30.0–36.0)
MCV: 84.2 fL (ref 78.0–100.0)
PLATELETS: 376 10*3/uL (ref 150–400)
RBC: 6.33 MIL/uL — ABNORMAL HIGH (ref 4.22–5.81)
RDW: 13.9 % (ref 11.5–15.5)
WBC: 15.1 10*3/uL — AB (ref 4.0–10.5)

## 2017-08-29 LAB — URINALYSIS, ROUTINE W REFLEX MICROSCOPIC
BILIRUBIN URINE: NEGATIVE
GLUCOSE, UA: 50 mg/dL — AB
HGB URINE DIPSTICK: NEGATIVE
KETONES UR: NEGATIVE mg/dL
LEUKOCYTES UA: NEGATIVE
NITRITE: NEGATIVE
PH: 5 (ref 5.0–8.0)
SPECIFIC GRAVITY, URINE: 1.031 — AB (ref 1.005–1.030)
Squamous Epithelial / LPF: NONE SEEN

## 2017-08-29 LAB — LIPASE, BLOOD: Lipase: 43 U/L (ref 11–51)

## 2017-08-29 MED ORDER — IPRATROPIUM-ALBUTEROL 0.5-2.5 (3) MG/3ML IN SOLN
3.0000 mL | RESPIRATORY_TRACT | Status: DC
  Administered 2017-08-29: 3 mL via RESPIRATORY_TRACT
  Filled 2017-08-29: qty 3

## 2017-08-29 MED ORDER — ONDANSETRON HCL 4 MG/2ML IJ SOLN
4.0000 mg | Freq: Once | INTRAMUSCULAR | Status: AC
  Administered 2017-08-29: 4 mg via INTRAVENOUS
  Filled 2017-08-29: qty 2

## 2017-08-29 MED ORDER — PREDNISONE 20 MG PO TABS
ORAL_TABLET | ORAL | 0 refills | Status: DC
Start: 2017-08-29 — End: 2017-08-31

## 2017-08-29 MED ORDER — SODIUM CHLORIDE 0.9 % IV BOLUS (SEPSIS)
1000.0000 mL | Freq: Once | INTRAVENOUS | Status: AC
  Administered 2017-08-29: 1000 mL via INTRAVENOUS

## 2017-08-29 MED ORDER — SODIUM CHLORIDE 0.9 % IV BOLUS (SEPSIS)
500.0000 mL | Freq: Once | INTRAVENOUS | Status: AC
  Administered 2017-08-29: 500 mL via INTRAVENOUS

## 2017-08-29 MED ORDER — ONDANSETRON HCL 4 MG PO TABS: 4.0000 mg | ORAL_TABLET | Freq: Three times a day (TID) | ORAL | 0 refills | Status: DC | PRN

## 2017-08-29 MED ORDER — AZITHROMYCIN 250 MG PO TABS: 250.0000 mg | ORAL_TABLET | Freq: Every day | ORAL | 0 refills | Status: DC

## 2017-08-29 MED ORDER — PREDNISONE 20 MG PO TABS
60.0000 mg | ORAL_TABLET | Freq: Once | ORAL | Status: AC
  Administered 2017-08-29: 60 mg via ORAL
  Filled 2017-08-29: qty 3

## 2017-08-29 MED ORDER — MORPHINE SULFATE (PF) 4 MG/ML IV SOLN
6.0000 mg | Freq: Once | INTRAVENOUS | Status: AC
  Administered 2017-08-29: 6 mg via INTRAVENOUS
  Filled 2017-08-29: qty 2

## 2017-08-29 MED ORDER — PROCHLORPERAZINE EDISYLATE 5 MG/ML IJ SOLN
10.0000 mg | Freq: Once | INTRAMUSCULAR | Status: AC
  Administered 2017-08-29: 10 mg via INTRAVENOUS
  Filled 2017-08-29: qty 2

## 2017-08-29 MED ORDER — DEXTROSE 5 % IV SOLN
500.0000 mg | Freq: Once | INTRAVENOUS | Status: AC
  Administered 2017-08-29: 500 mg via INTRAVENOUS
  Filled 2017-08-29: qty 500

## 2017-08-29 MED ORDER — SODIUM CHLORIDE 0.9 % IV BOLUS (SEPSIS): 1000.0000 mL | Freq: Once | INTRAVENOUS | Status: DC

## 2017-08-29 NOTE — ED Notes (Signed)
Pt states pain in lung at 7 requesting pain med

## 2017-08-29 NOTE — ED Triage Notes (Signed)
Patient c/o generalized abdominal pain with N/V/D x2 days and SOB since today. Hx COPD. Speaking in full sentences.

## 2017-08-29 NOTE — ED Provider Notes (Signed)
Emergency Department Provider Note   I have reviewed the triage vital signs and the nursing notes.   HISTORY  Chief Complaint Shortness of Breath and Abdominal Pain   HPI Roger Flores is a 56 y.o. male with a past history of asthma, cancer, CHF, COPD, diabetes, hypertension, hyperlipidemia, stroke and possible embolus presents to the emergency department today secondary to shortness of breath. Patient states the last few days and source of breath and cough of any get worse. He is also multiple episodes of nausea and vomiting that makes it difficult for her to take his home medications. Does cough things up but it is not sure of the color. No fevers but has had chills and sweats.similar to previous COPD exacerbations. Hasn't tried anything for her symptoms at home. With any pain initially but came here because the wait was too long. Has some chest pain with coughing but nothing beyond that. States is not taking any of his oral medications secondary to this to include pain medication and anticoagulation. No other modifying or exacerbating symptoms. No other associated symptoms.   Past Medical History:  Diagnosis Date  . Anxiety   . Arthritis    knees and back  . Asthma   . Cancer ([place])    Larynx  . Cataract   . CHF (congestive heart failure) ([place])   . COPD (chronic obstructive pulmonary disease) ([place])   . Depression   . Diabetes mellitus without complication ([place])   . Emphysema of lung ([place])   . GERD (gastroesophageal reflux disease)   . Hyperlipidemia   . Hypertension   . Kidney disease   . Liver disease   . Lung mass   . Myocardial infarction ([place])   . Opioid withdrawal ([place])   . Oxygen deficiency   . Stroke [facility])     Patient Active Problem List   Diagnosis Date Noted  . Chronic pain 07/24/2017  . Stroke ([place]) 07/21/2017  . History of cancer of larynx 07/21/2017  . Self-catheterizes urinary bladder 07/21/2017  . 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 DNR no code (do not resuscitate) 07/21/2017  . Insulin dependent diabetes mellitus ([place]) 07/21/2017  . Pressure injury of skin 07/19/2017  . Chest pain 07/18/2017  . COPD, very severe ([place]) 07/18/2017  . Unstable angina pectoris ([place]) 07/01/2017    Past Surgical History:  Procedure Laterality Date  . NECK SURGERY    . THROAT SURGERY      Current Outpatient Rx  . Order #: [id number] Class: Print  . Order #: [id number] Class: Print  . Order #: [id number] Class: Historical Med  . Order #: [id number] Class: Historical Med  . Order #: [id number] Class: Historical Med  . Order #: [id number] Class: Historical Med  . Order #: [id number] Class: Print  . Order #: [id number] Class: Print  . Order #: [id number] Class: Historical Med  . Order #: [id number] Class: Print  . Order #: [id number] Class: Historical Med  . Order #: [id number] Class: Historical Med  . Order #: [id number] Class: Historical Med  . Order #: [id number] Class: Print  . Order #: [id number] Class: Print  . Order #: [id number] Class: Print  . Order #: [id number] Class: Historical Med    Allergies Patient has no known allergies.  Family History  Problem Relation Age of Onset  . Arthritis Mother   . Asthma Mother   . COPD Mother   . Depression Mother   . Hyperlipidemia Mother   . Hypertension Mother   . Miscarriages / [place] Mother   . Alzheimer's disease  Mother   . Alcohol abuse Father   . Drug abuse Father   . Early death Father 24       gunshot  . Heart disease Brother 58  . Early death Son        SIDS  . Early death Son        MVA    Social History Social History  Substance Use Topics  . Smoking status: Current Every Day Smoker    Packs/day: 3.00    Types: Cigarettes    Start date: 11/25/1977  . Smokeless tobacco: Never Used  . Alcohol use No    Review of Systems  All other systems negative except as documented in the HPI. All pertinent positives and negatives as reviewed in the  HPI. ____________________________________________   PHYSICAL EXAM:  VITAL SIGNS: ED Triage Vitals  Enc Vitals Group     BP 08/29/17 1906 (!) 139/100     Pulse Rate 08/29/17 1906 (!) 122     Resp 08/29/17 1906 20     Temp 08/29/17 1906 98.2 F (36.8 C)     Temp Source 08/29/17 1906 Oral     SpO2 08/29/17 1906 99 %     Weight 08/29/17 1906 204 lb (92.5 kg)     Height 08/29/17 1906 5\' 6"  (1.676 m)     Head Circumference --      Peak Flow --      Pain Score 08/29/17 1928 8     Pain Loc --      Pain Edu? --      Excl. in [place]? --     Constitutional: Alert and oriented. Well appearing and in no acute distress. Eyes: Conjunctivae are normal. PERRL. EOMI. Head: Atraumatic. ose: No congestion/rhinnorhea. Mouth/Throat: Mucous membranes are moist.  Oropharynx non-erythematous. Neck: No stridor.  No meningeal signs.   Cardiovascular: tachycardic rate, regular rhythm. Good peripheral circulation. Grossly normal heart sounds.   Respiratory: tachypneic effort with rhonchi and wheezing.  No retractions.  Gastrointestinal: Soft and nontender. No distention.  Musculoskeletal: No lower extremity tenderness nor edema. No gross deformities of extremities. Neurologic:  Normal speech and language. No gross focal neurologic deficits are appreciated.  Skin:  Skin is warm, dry and intact. No rash noted.   ____________________________________________   LABS (all labs ordered are listed, but only abnormal results are displayed)  Labs Reviewed  COMPREHENSIVE METABOLIC PANEL - Abnormal; Notable for the following:       Result Value   Chloride 100 (*)    CO2 21 (*)    Glucose, Bld 225 (*)    Total Protein 8.8 (*)    AST 87 (*)    ALT 136 (*)    Anion gap 16 (*)    All other components within normal limits  CBC - Abnormal; Notable for the following:    WBC 15.1 (*)    RBC 6.33 (*)    Hemoglobin 18.3 (*)    HCT 53.3 (*)    All other components within normal limits  URINALYSIS, ROUTINE W  REFLEX MICROSCOPIC - Abnormal; Notable for the following:    Color, Urine AMBER (*)    APPearance CLOUDY (*)    Specific Gravity, Urine 1.031 (*)    Glucose, UA 50 (*)    Protein, ur >=300 (*)    Bacteria, UA RARE (*)    All other components within normal limits  LIPASE, BLOOD   ____________________________________________  EKG  My ECG Read Indication: shortness of breath EKG  was personally contemporaneously reviewed by myself. Rate: 123 PR Interval: 190 QRS duration: 128 QT/QTC: 328/470 Axis: right EKG: unchanged from previous tracings, sinus tachycardia, RBBB. Other significant findings: likely rate related ST changes   ____________________________________________  RADIOLOGY  Dg Chest 2 View  Result Date: 08/29/2017 CLINICAL DATA:  Shortness of breath EXAM: CHEST  2 VIEW COMPARISON:  07/29/2017 FINDINGS: The heart size and mediastinal contours are within normal limits. Both lungs are clear. The visualized skeletal structures are unremarkable. IMPRESSION: No active cardiopulmonary disease. Electronically Signed   By: Donavan Foil M.D.   On: 08/29/2017 19:48    ____________________________________________   PROCEDURES  Procedure(s) performed:   Procedures   ____________________________________________   INITIAL IMPRESSION / ASSESSMENT AND PLAN / ED COURSE  Pertinent labs & imaging results that were available during my care of the patient were reviewed by me and considered in my medical decision making (see chart for details).  Likely COPD exacerbation. Will treat symptoms.   Significantly improved symptoms. Labs ok. Workup ok. Likely copd. toleratign PO. Stable for dc with hospice follow up.  ____________________________________________  FINAL CLINICAL IMPRESSION(S) / ED DIAGNOSES  Final diagnoses:  Shortness of breath  Nausea     MEDICATIONS GIVEN DURING THIS VISIT:  Medications  sodium chloride 0.9 % bolus 1,000 mL (0 mLs Intravenous Stopped  08/29/17 2158)  morphine 4 MG/ML injection 6 mg (6 mg Intravenous Given 08/29/17 2102)  prochlorperazine (COMPAZINE) injection 10 mg (10 mg Intravenous Given 08/29/17 2102)  predniSONE (DELTASONE) tablet 60 mg (60 mg Oral Given 08/29/17 2102)  azithromycin (ZITHROMAX) 500 mg in dextrose 5 % 250 mL IVPB (0 mg Intravenous Stopped 08/29/17 2203)  ondansetron (ZOFRAN) injection 4 mg (4 mg Intravenous Given 08/29/17 2229)  sodium chloride 0.9 % bolus 500 mL (0 mLs Intravenous Stopped 08/29/17 2331)  morphine 4 MG/ML injection 6 mg (6 mg Intravenous Given 08/29/17 2331)     NEW OUTPATIENT MEDICATIONS STARTED DURING THIS VISIT:  Discharge Medication List as of 08/29/2017 11:05 PM    START taking these medications   Details  azithromycin (ZITHROMAX) 250 MG tablet Take 1 tablet (250 mg total) by mouth daily. Take 1 every day until finished., Starting Fri 08/29/2017, Print    ondansetron (ZOFRAN) 4 MG tablet Take 1 tablet (4 mg total) by mouth every 8 (eight) hours as needed for nausea or vomiting., Starting Fri 08/29/2017, Print        Note:  This document was prepared using Dragon voice recognition software and may include unintentional dictation errors.   Alyx Gee, Corene Cornea, MD 08/30/17 0005

## 2017-08-29 NOTE — ED Triage Notes (Signed)
Patient c/o upper abd pain. Patient states nausea, vomiting, diarrhea, and fevers. Patient states that he has to "cath himself." reports dark urine. Patient has COPD and reports increase in shortness of breath.

## 2017-08-31 ENCOUNTER — Encounter (HOSPITAL_COMMUNITY): Payer: Self-pay | Admitting: *Deleted

## 2017-08-31 ENCOUNTER — Emergency Department (HOSPITAL_COMMUNITY)
Admission: EM | Admit: 2017-08-31 | Discharge: 2017-09-01 | Disposition: A | Discharge status: Home / Self Care | Payer: Medicare Other | Attending: Emergency Medicine | Admitting: Emergency Medicine

## 2017-08-31 ENCOUNTER — Other Ambulatory Visit: Payer: Self-pay

## 2017-08-31 ENCOUNTER — Emergency Department (HOSPITAL_COMMUNITY): Payer: Medicare Other

## 2017-08-31 DIAGNOSIS — Z7901 Long term (current) use of anticoagulants: Secondary | ICD-10-CM | POA: Diagnosis not present

## 2017-08-31 DIAGNOSIS — J45909 Unspecified asthma, uncomplicated: Secondary | ICD-10-CM | POA: Diagnosis not present

## 2017-08-31 DIAGNOSIS — F17210 Nicotine dependence, cigarettes, uncomplicated: Secondary | ICD-10-CM | POA: Diagnosis not present

## 2017-08-31 DIAGNOSIS — R739 Hyperglycemia, unspecified: Secondary | ICD-10-CM | POA: Insufficient documentation

## 2017-08-31 DIAGNOSIS — R079 Chest pain, unspecified: Secondary | ICD-10-CM | POA: Insufficient documentation

## 2017-08-31 DIAGNOSIS — J449 Chronic obstructive pulmonary disease, unspecified: Secondary | ICD-10-CM | POA: Insufficient documentation

## 2017-08-31 DIAGNOSIS — I252 Old myocardial infarction: Secondary | ICD-10-CM | POA: Diagnosis not present

## 2017-08-31 DIAGNOSIS — I110 Hypertensive heart disease with heart failure: Secondary | ICD-10-CM | POA: Insufficient documentation

## 2017-08-31 DIAGNOSIS — L03114 Cellulitis of left upper limb: Secondary | ICD-10-CM | POA: Diagnosis not present

## 2017-08-31 DIAGNOSIS — I509 Heart failure, unspecified: Secondary | ICD-10-CM | POA: Diagnosis not present

## 2017-08-31 DIAGNOSIS — E119 Type 2 diabetes mellitus without complications: Secondary | ICD-10-CM | POA: Diagnosis not present

## 2017-08-31 DIAGNOSIS — Z8521 Personal history of malignant neoplasm of larynx: Secondary | ICD-10-CM | POA: Insufficient documentation

## 2017-08-31 DIAGNOSIS — Z794 Long term (current) use of insulin: Secondary | ICD-10-CM | POA: Diagnosis not present

## 2017-08-31 DIAGNOSIS — Z79899 Other long term (current) drug therapy: Secondary | ICD-10-CM | POA: Diagnosis not present

## 2017-08-31 LAB — CBC
HEMATOCRIT: 47.9 % (ref 39.0–52.0)
Hemoglobin: 16.2 g/dL (ref 13.0–17.0)
MCH: 28.8 pg (ref 26.0–34.0)
MCHC: 33.8 g/dL (ref 30.0–36.0)
MCV: 85.1 fL (ref 78.0–100.0)
Platelets: 302 10*3/uL (ref 150–400)
RBC: 5.63 MIL/uL (ref 4.22–5.81)
RDW: 13.8 % (ref 11.5–15.5)
WBC: 13.6 10*3/uL — AB (ref 4.0–10.5)

## 2017-08-31 LAB — BASIC METABOLIC PANEL
ANION GAP: 8 (ref 5–15)
BUN: 15 mg/dL (ref 6–20)
CO2: 26 mmol/L (ref 22–32)
Calcium: 9 mg/dL (ref 8.9–10.3)
Chloride: 99 mmol/L — ABNORMAL LOW (ref 101–111)
Creatinine, Ser: 0.68 mg/dL (ref 0.61–1.24)
Glucose, Bld: 303 mg/dL — ABNORMAL HIGH (ref 65–99)
POTASSIUM: 4 mmol/L (ref 3.5–5.1)
Sodium: 133 mmol/L — ABNORMAL LOW (ref 135–145)

## 2017-08-31 LAB — I-STAT TROPONIN, ED: Troponin i, poc: 0 ng/mL (ref 0.00–0.08)

## 2017-08-31 MED ORDER — ONDANSETRON HCL 4 MG/2ML IJ SOLN
4.0000 mg | Freq: Once | INTRAMUSCULAR | Status: AC
  Administered 2017-08-31: 4 mg via INTRAVENOUS
  Filled 2017-08-31: qty 2

## 2017-08-31 MED ORDER — MORPHINE SULFATE (PF) 4 MG/ML IV SOLN
4.0000 mg | Freq: Once | INTRAVENOUS | Status: AC
  Administered 2017-08-31: 4 mg via INTRAVENOUS
  Filled 2017-08-31: qty 1

## 2017-08-31 NOTE — ED Provider Notes (Signed)
Roger Flores Provider Note   CSN: [id number] Arrival date & time: 08/31/17  2102     History   Chief Complaint Chief Complaint  Patient presents with  . Chest Pain    HPI Roger Flores is a 56 y.o. male.  Chest pain 2-3 hours ago. EMS was notified and patient was given nitroglycerin and aspirin which seemed to help. He is presently an Eliquis. He was seen in the emergency department in [place] on Saturday for nausea, vomiting, diarrhea. He has known coronary artery disease and COPD. Complains of nausea and diaphoresis but no obvious dyspnea. He states he is on hospice secondary to terminal COPD.  He is feeling better in the emergency department.      Past Medical History:  Diagnosis Date  . Anxiety   . Arthritis    knees and back  . Asthma   . Cancer ([place])    Larynx  . Cataract   . CHF (congestive heart failure) ([place])   . COPD (chronic obstructive pulmonary disease) ([place])   . Depression   . Diabetes mellitus without complication ([place])   . Emphysema of lung ([place])   . GERD (gastroesophageal reflux disease)   . Hyperlipidemia   . Hypertension   . Kidney disease   . Liver disease   . Lung mass   . Myocardial infarction ([place])   . Opioid withdrawal ([employer])   . Oxygen deficiency   . Stroke [facility])     Patient Active Problem List   Diagnosis Date Noted  . Chronic pain 07/24/2017  . Stroke ([place]) 07/21/2017  . History of cancer of larynx 07/21/2017  . Self-catheterizes urinary bladder 07/21/2017  . 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 DNR no code (do not resuscitate) 07/21/2017  . Insulin dependent diabetes mellitus ([place]) 07/21/2017  . Pressure injury of skin 07/19/2017  . Chest pain 07/18/2017  . COPD, very severe ([place]) 07/18/2017  . Unstable angina pectoris ([place]) 07/01/2017    Past Surgical History:  Procedure Laterality Date  . NECK SURGERY    . THROAT SURGERY         Home Medications    Prior to Admission medications     Medication Sig Start Date End Date Taking? Authorizing Provider  albuterol (PROVENTIL) (2.5 MG/3ML) 0.083% nebulizer solution Take 3 mLs (2.5 mg total) by nebulization every 6 (six) hours as needed for wheezing or shortness of breath. 07/20/17  Yes Kathie Dike, MD  apixaban (ELIQUIS) 5 MG TABS tablet Take 1 tablet (5 mg total) by mouth 2 (two) times daily. 07/04/17  Yes Jola Schmidt, MD  docusate sodium (COLACE) 100 MG capsule Take 100 mg by mouth 2 (two) times daily as needed for constipation.   Yes [provider]  feeding supplement, GLUCERNA SHAKE, (GLUCERNA SHAKE) LIQD Take 237 mLs by mouth 2 (two) times daily.    Yes [provider]  furosemide (LASIX) 40 MG tablet Take 40 mg by mouth 3 (three) times daily as needed for fluid.    Yes [provider]  ibuprofen (ADVIL,MOTRIN) 800 MG tablet Take 800 mg by mouth every 8 (eight) hours as needed for moderate pain.   Yes [provider]  insulin aspart (NOVOLOG) 100 UNIT/ML injection Inject 8-20 Units into the skin 4 (four) times daily. Patient is on a sliding scale   Yes [provider]  LORazepam (ATIVAN) 0.5 MG tablet Take 1 tablet (0.5 mg total) by mouth every 8 (eight) hours as needed for anxiety.  07/22/17  Yes Duffy Bruce, MD  morphine (MS CONTIN) 30 MG 12 hr tablet Take 1 tablet (30 mg total) by mouth every 6 (six) hours. 07/22/17  Yes Duffy Bruce, MD  morphine (MSIR) 15 MG tablet Take 15 mg by mouth every 6 (six) hours as needed for severe pain.   Yes [provider]  nitroGLYCERIN (NITROSTAT) 0.4 MG SL tablet Place 1 tablet under the tongue daily as needed for chest pain. 05/23/17  Yes [provider]  ondansetron (ZOFRAN) 4 MG tablet Take 1 tablet (4 mg total) by mouth every 8 (eight) hours as needed for nausea or vomiting. 08/29/17  Yes Mesner, Corene Cornea, MD  OXYGEN Inhale 3 L into the lungs daily. At home   Yes [provider]  prochlorperazine (COMPAZINE) 5 MG  tablet Take 5 mg by mouth every 6 (six) hours as needed for nausea or vomiting.   Yes [provider]  tamsulosin (FLOMAX) 0.4 MG CAPS capsule Take 0.4 mg by mouth daily.   Yes [provider]  azithromycin (ZITHROMAX) 250 MG tablet Take 1 tablet (250 mg total) by mouth daily. Take 1 every day until finished. Patient not taking: Reported on 08/31/2017 08/29/17   Mesner, Corene Cornea, MD  doxycycline (VIBRAMYCIN) 100 MG capsule Take 1 capsule (100 mg total) by mouth 2 (two) times daily. 09/01/17   Nat Christen, MD    Family History Family History  Problem Relation Age of Onset  . Arthritis Mother   . Asthma Mother   . COPD Mother   . Depression Mother   . Hyperlipidemia Mother   . Hypertension Mother   . Miscarriages / [place] Mother   . Alzheimer's disease Mother   . Alcohol abuse Father   . Drug abuse Father   . Early death Father 7       gunshot  . Heart disease Brother 79  . Early death Son        SIDS  . Early death Son        MVA    Social History Social History  Substance Use Topics  . Smoking status: Current Every Day Smoker    Packs/day: 3.00    Types: Cigarettes    Start date: 11/25/1977  . Smokeless tobacco: Never Used  . Alcohol use No     Allergies   Patient has no known allergies.   Review of Systems Review of Systems  All other systems reviewed and are negative.    Physical Exam Updated Vital Signs BP 131/78   Pulse 85   Temp 98.3 F (36.8 C) (Oral)   Resp 18   Ht 5\' 6"  (1.676 m)   Wt 92.5 kg (204 lb)   SpO2 97%   BMI 32.93 kg/m   Physical Exam  Constitutional: He is oriented to person, place, and time.  nad  HENT:  Head: Normocephalic and atraumatic.  Eyes: Conjunctivae are normal.  Neck: Neck supple.  Cardiovascular: Normal rate and regular rhythm.   Pulmonary/Chest: Effort normal and breath sounds normal.  Abdominal: Soft. Bowel sounds are normal.  Musculoskeletal: Normal range of motion.  Neurological: He is alert  and oriented to person, place, and time.  Skin:  Area of erythema on the dorsum of the left forearm in the vicinity where an IV was placed  Psychiatric: He has a normal mood and affect. His behavior is normal.  Nursing note and vitals reviewed.    ED Treatments / Results  Labs (all labs ordered are listed, but  only abnormal results are displayed) Labs Reviewed  BASIC METABOLIC PANEL - Abnormal; Notable for the following:       Result Value   Sodium 133 (*)    Chloride 99 (*)    Glucose, Bld 303 (*)    All other components within normal limits  CBC - Abnormal; Notable for the following:    WBC 13.6 (*)    All other components within normal limits  I-STAT TROPONIN, ED  I-STAT TROPONIN, ED    EKG  EKG Interpretation  Date/Time:  Sunday August 31 2017 21:09:19 EDT Ventricular Rate:  90 PR Interval:    QRS Duration: 126 QT Interval:  366 QTC Calculation: 448 R Axis:   65 Text Interpretation:  Sinus rhythm Right bundle branch block No significant change since last tracing Confirmed by Thayer Jew [phone]) on 09/01/2017 3:49:42 PM      Date: 08/31/2017  Rate: 90  Rhythm: normal sinus rhythm  QRS Axis: normal  Intervals: normal  ST/T Wave abnormalities: normal  Conduction Disutrbances: none  Narrative Interpretation: unremarkable     Radiology Dg Chest 2 View  Result Date: 09/01/2017 CLINICAL DATA:  Dyspnea and bilateral chest pain, 2 hours duration. EXAM: CHEST  2 VIEW COMPARISON:  08/31/2017 FINDINGS: Mild linear scarring or atelectasis in the lateral left base. No confluent airspace consolidation. No effusions. Normal pulmonary vasculature. Unremarkable hilar, mediastinal and cardiac contours, unchanged. IMPRESSION: Minimal linear left base opacities due to scarring or atelectasis. No consolidation or effusion. Electronically Signed   By: Andreas Newport M.D.   On: 09/01/2017 21:13    Procedures Procedures (including critical care time)  Medications Ordered  in ED Medications  ondansetron (ZOFRAN) injection 4 mg (4 mg Intravenous Given 08/31/17 2220)  morphine 4 MG/ML injection 4 mg (4 mg Intravenous Given 08/31/17 2220)     Initial Impression / Assessment and Plan / ED Course  I have reviewed the triage vital signs and the nursing notes.  Pertinent labs & imaging results that were available during my care of the patient were reviewed by me and considered in my medical decision making (see chart for details).     Patient is stable in the emergency department. EKG, troponin, chest x-ray negative. Patient does not want to stay for a further troponin. Will Rx doxycycline for cellulitis in his left forearm.  Final Clinical Impressions(s) / ED Diagnoses   Final diagnoses:  Chest pain, unspecified type  Hyperglycemia  Cellulitis of left forearm    New Prescriptions Discharge Medication List as of 09/01/2017 12:11 AM    START taking these medications   Details  doxycycline (VIBRAMYCIN) 100 MG capsule Take 1 capsule (100 mg total) by mouth 2 (two) times daily., Starting Mon 09/01/2017, Print         Nat Christen, MD 09/03/17 [phone]

## 2017-08-31 NOTE — ED Triage Notes (Signed)
Pt brought in by [facility] for c/o central chest pain that radiates to left jaw; pt took one nitro at home with 324mg  aspirin and pt was given one nitro en route by ems; pt states his pain has decreased to a 5/10; pt cbg 283 with ems

## 2017-08-31 NOTE — ED Notes (Signed)
Pt reports being seen several days ago at [facility] and being diagnosed with a "stomach virus" tonight he reports CP pointing to center chest and states it goes up into his jaw.  He reports taking 1 NTG at home and having been given 1 NTG enroute by EMS He also was given Asa 81 mg x 4 enroute  He reports smoking "a little bit" at 1-3 PPD

## 2017-08-31 NOTE — ED Notes (Signed)
Pt reports that he has an appt w Dr Maudie Mercury this week and he will renew his pain meds  As he is almost out

## 2017-09-01 ENCOUNTER — Other Ambulatory Visit: Payer: Self-pay

## 2017-09-01 ENCOUNTER — Emergency Department (HOSPITAL_COMMUNITY)
Admission: EM | Admit: 2017-09-01 | Discharge: 2017-09-01 | Discharge status: Against Medical Advice | Payer: Medicare Other | Source: Home / Self Care

## 2017-09-01 ENCOUNTER — Emergency Department (HOSPITAL_COMMUNITY): Payer: Medicare Other

## 2017-09-01 ENCOUNTER — Encounter (HOSPITAL_COMMUNITY): Payer: Self-pay | Admitting: Emergency Medicine

## 2017-09-01 DIAGNOSIS — Z794 Long term (current) use of insulin: Secondary | ICD-10-CM | POA: Insufficient documentation

## 2017-09-01 DIAGNOSIS — I509 Heart failure, unspecified: Secondary | ICD-10-CM | POA: Insufficient documentation

## 2017-09-01 DIAGNOSIS — I252 Old myocardial infarction: Secondary | ICD-10-CM | POA: Insufficient documentation

## 2017-09-01 DIAGNOSIS — E119 Type 2 diabetes mellitus without complications: Secondary | ICD-10-CM | POA: Insufficient documentation

## 2017-09-01 DIAGNOSIS — J45909 Unspecified asthma, uncomplicated: Secondary | ICD-10-CM | POA: Insufficient documentation

## 2017-09-01 DIAGNOSIS — J441 Chronic obstructive pulmonary disease with (acute) exacerbation: Secondary | ICD-10-CM | POA: Insufficient documentation

## 2017-09-01 DIAGNOSIS — Z9981 Dependence on supplemental oxygen: Secondary | ICD-10-CM

## 2017-09-01 DIAGNOSIS — F419 Anxiety disorder, unspecified: Secondary | ICD-10-CM | POA: Insufficient documentation

## 2017-09-01 DIAGNOSIS — Z7901 Long term (current) use of anticoagulants: Secondary | ICD-10-CM | POA: Insufficient documentation

## 2017-09-01 DIAGNOSIS — R0602 Shortness of breath: Secondary | ICD-10-CM

## 2017-09-01 DIAGNOSIS — Z66 Do not resuscitate: Secondary | ICD-10-CM | POA: Insufficient documentation

## 2017-09-01 DIAGNOSIS — Z8521 Personal history of malignant neoplasm of larynx: Secondary | ICD-10-CM

## 2017-09-01 DIAGNOSIS — Z5321 Procedure and treatment not carried out due to patient leaving prior to being seen by health care provider: Secondary | ICD-10-CM | POA: Insufficient documentation

## 2017-09-01 DIAGNOSIS — F17210 Nicotine dependence, cigarettes, uncomplicated: Secondary | ICD-10-CM | POA: Insufficient documentation

## 2017-09-01 DIAGNOSIS — Z8673 Personal history of transient ischemic attack (TIA), and cerebral infarction without residual deficits: Secondary | ICD-10-CM | POA: Insufficient documentation

## 2017-09-01 DIAGNOSIS — I110 Hypertensive heart disease with heart failure: Secondary | ICD-10-CM

## 2017-09-01 DIAGNOSIS — Z79899 Other long term (current) drug therapy: Secondary | ICD-10-CM

## 2017-09-01 LAB — I-STAT TROPONIN, ED
TROPONIN I, POC: 0 ng/mL (ref 0.00–0.08)
Troponin i, poc: 0 ng/mL (ref 0.00–0.08)

## 2017-09-01 LAB — BASIC METABOLIC PANEL
Anion gap: 10 (ref 5–15)
BUN: 10 mg/dL (ref 6–20)
CALCIUM: 9.4 mg/dL (ref 8.9–10.3)
CHLORIDE: 102 mmol/L (ref 101–111)
CO2: 24 mmol/L (ref 22–32)
Creatinine, Ser: 0.95 mg/dL (ref 0.61–1.24)
Glucose, Bld: 299 mg/dL — ABNORMAL HIGH (ref 65–99)
POTASSIUM: 4.6 mmol/L (ref 3.5–5.1)
SODIUM: 136 mmol/L (ref 135–145)

## 2017-09-01 LAB — CBC
HCT: 50.7 % (ref 39.0–52.0)
Hemoglobin: 17.5 g/dL — ABNORMAL HIGH (ref 13.0–17.0)
MCH: 29 pg (ref 26.0–34.0)
MCHC: 34.5 g/dL (ref 30.0–36.0)
MCV: 84.1 fL (ref 78.0–100.0)
Platelets: 304 10*3/uL (ref 150–400)
RBC: 6.03 MIL/uL — ABNORMAL HIGH (ref 4.22–5.81)
RDW: 13.7 % (ref 11.5–15.5)
WBC: 12.3 10*3/uL — AB (ref 4.0–10.5)

## 2017-09-01 MED ORDER — DOXYCYCLINE HYCLATE 100 MG PO CAPS: 100.0000 mg | ORAL_CAPSULE | Freq: Two times a day (BID) | ORAL | 0 refills | Status: DC

## 2017-09-01 NOTE — ED Triage Notes (Signed)
Pt states he is not waiting to be seen and is going to another facility.

## 2017-09-01 NOTE — Discharge Instructions (Signed)
Your tests related to a heart attack were negative 2.  Glucose was 313.  Prescription for doxycycline fair left forearm. Follow-up with your hospice nurse.

## 2017-09-01 NOTE — ED Notes (Signed)
No answer for triage.

## 2017-09-01 NOTE — ED Notes (Signed)
ED Provider at bedside. 

## 2017-09-01 NOTE — ED Triage Notes (Signed)
Pt c/o increased sob since waking up from a nap tonight and is a hospice pt and on continuous oxygen. Per hospice nurse that called to give report pt is out of morphine at home.

## 2017-09-01 NOTE — ED Triage Notes (Signed)
Pt c/o 8/10 bilateral cp and sob that started 2 hours ago, pt is a home O2 dependent on 3L. Denies any fever, chills, nausea or vomiting at this time.

## 2017-09-02 ENCOUNTER — Emergency Department (HOSPITAL_COMMUNITY)
Admission: EM | Admit: 2017-09-02 | Discharge: 2017-09-02 | Disposition: A | Discharge status: Home / Self Care | Payer: Medicare Other | Source: Home / Self Care | Attending: Emergency Medicine | Admitting: Emergency Medicine

## 2017-09-02 DIAGNOSIS — J441 Chronic obstructive pulmonary disease with (acute) exacerbation: Secondary | ICD-10-CM

## 2017-09-02 MED ORDER — IPRATROPIUM-ALBUTEROL 0.5-2.5 (3) MG/3ML IN SOLN
3.0000 mL | Freq: Once | RESPIRATORY_TRACT | Status: AC
  Administered 2017-09-02: 3 mL via RESPIRATORY_TRACT
  Filled 2017-09-02: qty 3

## 2017-09-02 MED ORDER — METHYLPREDNISOLONE SODIUM SUCC 125 MG IJ SOLR
125.0000 mg | Freq: Once | INTRAMUSCULAR | Status: AC
  Administered 2017-09-02: 125 mg via INTRAVENOUS
  Filled 2017-09-02: qty 2

## 2017-09-02 MED ORDER — MORPHINE SULFATE (PF) 4 MG/ML IV SOLN
6.0000 mg | Freq: Once | INTRAVENOUS | Status: AC
  Administered 2017-09-02: 6 mg via INTRAVENOUS
  Filled 2017-09-02: qty 2

## 2017-09-02 NOTE — ED Notes (Addendum)
Off oxygen and outside to smoke. Up to nurse first desk requesting update, provided answers to questions and apologized for wait.

## 2017-09-02 NOTE — ED Notes (Signed)
Pt continues to take oxygen off to ambulate around waiting room for snacks and smoking.

## 2017-09-02 NOTE — ED Provider Notes (Signed)
[facility] [facility] DEPT Provider Note   CSN: [id number] Arrival date & time: 09/01/17  2020     History   Chief Complaint Chief Complaint  Patient presents with  . Chest Pain  . Shortness of Breath    HPI Roger Flores is a 56 y.o. male.  Patient presents to the emergency department with chief complaint of shortness of breath and wheezing. He states that his symptoms started this morning. He states that he did not sleep with the head of his bed raised. He believes this exacerbated his symptoms. He reports having a cough with some associated blood-tinged sputum, but this is not new for him. He states he has had this for the past 6 months. He reports that he is on hospice for emphysema and COPD. He wears 3 L of oxygen at all times. He denies any other associated symptoms.   The history is provided by the patient. No language interpreter was used.    Past Medical History:  Diagnosis Date  . Anxiety   . Arthritis    knees and back  . Asthma   . Cancer ([place])    Larynx  . Cataract   . CHF (congestive heart failure) ([place])   . COPD (chronic obstructive pulmonary disease) ([place])   . Depression   . Diabetes mellitus without complication ([place])   . Emphysema of lung ([place])   . GERD (gastroesophageal reflux disease)   . Hyperlipidemia   . Hypertension   . Kidney disease   . Liver disease   . Lung mass   . Myocardial infarction ([place])   . Opioid withdrawal ([place])   . Oxygen deficiency   . Stroke [facility])     Patient Active Problem List   Diagnosis Date Noted  . Chronic pain 07/24/2017  . Stroke ([place]) 07/21/2017  . History of cancer of larynx 07/21/2017  . Self-catheterizes urinary bladder 07/21/2017  . 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 DNR no code (do not resuscitate) 07/21/2017  . Insulin dependent diabetes mellitus ([place]) 07/21/2017  . Pressure injury of skin 07/19/2017  . Chest pain 07/18/2017  . COPD, very severe ([place]) 07/18/2017  . Unstable angina pectoris  ([place]) 07/01/2017    Past Surgical History:  Procedure Laterality Date  . NECK SURGERY    . THROAT SURGERY         Home Medications    Prior to Admission medications   Medication Sig Start Date End Date Taking? Authorizing Provider  albuterol (PROVENTIL) (2.5 MG/3ML) 0.083% nebulizer solution Take 3 mLs (2.5 mg total) by nebulization every 6 (six) hours as needed for wheezing or shortness of breath. 07/20/17   Kathie Dike, MD  apixaban (ELIQUIS) 5 MG TABS tablet Take 1 tablet (5 mg total) by mouth 2 (two) times daily. 07/04/17   Jola Schmidt, MD  azithromycin (ZITHROMAX) 250 MG tablet Take 1 tablet (250 mg total) by mouth daily. Take 1 every day until finished. Patient not taking: Reported on 08/31/2017 08/29/17   Mesner, Corene Cornea, MD  docusate sodium (COLACE) 100 MG capsule Take 100 mg by mouth 2 (two) times daily as needed for constipation.    [provider]  doxycycline (VIBRAMYCIN) 100 MG capsule Take 1 capsule (100 mg total) by mouth 2 (two) times daily. 09/01/17   Nat Christen, MD  feeding supplement, GLUCERNA SHAKE, (GLUCERNA SHAKE) LIQD Take 237 mLs by mouth 2 (two) times daily.     [provider]  furosemide (LASIX) 40 MG tablet Take 40  mg by mouth 3 (three) times daily as needed for fluid.     [provider]  ibuprofen (ADVIL,MOTRIN) 800 MG tablet Take 800 mg by mouth every 8 (eight) hours as needed for moderate pain.    [provider]  insulin aspart (NOVOLOG) 100 UNIT/ML injection Inject 8-20 Units into the skin 4 (four) times daily. Patient is on a sliding scale    [provider]  LORazepam (ATIVAN) 0.5 MG tablet Take 1 tablet (0.5 mg total) by mouth every 8 (eight) hours as needed for anxiety. 07/22/17   Duffy Bruce, MD  morphine (MS CONTIN) 30 MG 12 hr tablet Take 1 tablet (30 mg total) by mouth every 6 (six) hours. 07/22/17   Duffy Bruce, MD  morphine (MSIR) 15 MG tablet Take 15 mg by mouth every 6 (six) hours as needed  for severe pain.    [provider]  nitroGLYCERIN (NITROSTAT) 0.4 MG SL tablet Place 1 tablet under the tongue daily as needed for chest pain. 05/23/17   [provider]  ondansetron (ZOFRAN) 4 MG tablet Take 1 tablet (4 mg total) by mouth every 8 (eight) hours as needed for nausea or vomiting. 08/29/17   Mesner, Corene Cornea, MD  OXYGEN Inhale 3 L into the lungs daily. At home    [provider]  prochlorperazine (COMPAZINE) 5 MG tablet Take 5 mg by mouth every 6 (six) hours as needed for nausea or vomiting.    [provider]  tamsulosin (FLOMAX) 0.4 MG CAPS capsule Take 0.4 mg by mouth daily.    [provider]    Family History Family History  Problem Relation Age of Onset  . Arthritis Mother   . Asthma Mother   . COPD Mother   . Depression Mother   . Hyperlipidemia Mother   . Hypertension Mother   . Miscarriages / [place] Mother   . Alzheimer's disease Mother   . Alcohol abuse Father   . Drug abuse Father   . Early death Father 81       gunshot  . Heart disease Brother 73  . Early death Son        SIDS  . Early death Son        MVA    Social History Social History  Substance Use Topics  . Smoking status: Current Every Day Smoker    Packs/day: 3.00    Types: Cigarettes    Start date: 11/25/1977  . Smokeless tobacco: Never Used  . Alcohol use No     Allergies   Patient has no known allergies.   Review of Systems Review of Systems  All other systems reviewed and are negative.    Physical Exam Updated Vital Signs BP 138/77 (BP Location: Right Arm)   Pulse (!) 107   Temp 98.3 F (36.8 C) (Oral)   Resp 20   Ht 5\' 6"  (1.676 m)   Wt 92.5 kg (204 lb)   SpO2 98%   BMI 32.93 kg/m   Physical Exam  Constitutional: He is oriented to person, place, and time. He appears well-developed and well-nourished.  HENT:  Head: Normocephalic and atraumatic.  Eyes: Pupils are equal, round, and reactive to light. Conjunctivae and  EOM are normal. Right eye exhibits no discharge. Left eye exhibits no discharge. No scleral icterus.  Neck: Normal range of motion. Neck supple. No JVD present.  Cardiovascular: Normal rate, regular rhythm and normal heart sounds.  Exam reveals no gallop and no friction rub.  No murmur heard. Pulmonary/Chest: Effort normal. No respiratory distress. He has wheezes. He has no rales. He exhibits no tenderness.  Expiratory wheezing through all fields  Abdominal: Soft. He exhibits no distension and no mass. There is no tenderness. There is no rebound and no guarding.  Musculoskeletal: Normal range of motion. He exhibits no edema or tenderness.  Neurological: He is alert and oriented to person, place, and time.  Skin: Skin is warm and dry.  Psychiatric: He has a normal mood and affect. His behavior is normal. Judgment and thought content normal.  Nursing note and vitals reviewed.    ED Treatments / Results  Labs (all labs ordered are listed, but only abnormal results are displayed) Labs Reviewed  BASIC METABOLIC PANEL - Abnormal; Notable for the following:       Result Value   Glucose, Bld 299 (*)    All other components within normal limits  CBC - Abnormal; Notable for the following:    WBC 12.3 (*)    RBC 6.03 (*)    Hemoglobin 17.5 (*)    All other components within normal limits  I-STAT TROPONIN, ED    EKG  EKG Interpretation None       Radiology Dg Chest 2 View  Result Date: 09/01/2017 CLINICAL DATA:  Dyspnea and bilateral chest pain, 2 hours duration. EXAM: CHEST  2 VIEW COMPARISON:  08/31/2017 FINDINGS: Mild linear scarring or atelectasis in the lateral left base. No confluent airspace consolidation. No effusions. Normal pulmonary vasculature. Unremarkable hilar, mediastinal and cardiac contours, unchanged. IMPRESSION: Minimal linear left base opacities due to scarring or atelectasis. No consolidation or effusion. Electronically Signed   By: Andreas Newport M.D.   On:  09/01/2017 21:13   Dg Chest 2 View  Result Date: 08/31/2017 CLINICAL DATA:  Acute onset of central chest pain, radiating to the left jaw. Vomiting. Initial encounter. EXAM: CHEST  2 VIEW COMPARISON:  Chest radiograph performed 08/29/2017 FINDINGS: The lungs are well-aerated. Minimal atelectasis or scarring is noted at the left lung base. There is no evidence of focal opacification, pleural effusion or pneumothorax. The heart is normal in size; the mediastinal contour is within normal limits. No acute osseous abnormalities are seen. IMPRESSION: No acute cardiopulmonary process seen. Electronically Signed   By: Garald Balding M.D.   On: 08/31/2017 22:00    Procedures Procedures (including critical care time)  Medications Ordered in ED Medications  morphine 4 MG/ML injection 6 mg (not administered)  methylPREDNISolone sodium succinate (SOLU-MEDROL) 125 mg/2 mL injection 125 mg (not administered)  ipratropium-albuterol (DUONEB) 0.5-2.5 (3) MG/3ML nebulizer solution 3 mL (not administered)     Initial Impression / Assessment and Plan / ED Course  I have reviewed the triage vital signs and the nursing notes.  Pertinent labs & imaging results that were available during my care of the patient were reviewed by me and considered in my medical decision making (see chart for details).     Patient with wheezing and shortness of breath. Symptoms worsened this morning. Patient is seen frequently for the same. Will give breathing treatment, Solu-Medrol, and reassess. Chest x-ray shows no acute infiltrate.  Patient feels significantly improved after breathing treatment and steroids. He states that he feels ready to go home. Final Clinical Impressions(s) / ED Diagnoses   Final diagnoses:  COPD exacerbation [facility])    New Prescriptions New Prescriptions   No medications on file     Montine Circle, PA-C 09/02/17 0400    Pollina, Gwenyth Allegra, MD  09/02/17 0652  

## 2017-09-25 ENCOUNTER — Other Ambulatory Visit: Payer: Self-pay

## 2017-09-25 ENCOUNTER — Emergency Department (HOSPITAL_COMMUNITY)
Admission: EM | Admit: 2017-09-25 | Discharge: 2017-09-25 | Disposition: A | Discharge status: Home / Self Care | Attending: Emergency Medicine | Admitting: Emergency Medicine

## 2017-09-25 ENCOUNTER — Encounter (HOSPITAL_COMMUNITY): Payer: Self-pay | Admitting: Emergency Medicine

## 2017-09-25 ENCOUNTER — Emergency Department (HOSPITAL_COMMUNITY)
Admission: EM | Admit: 2017-09-25 | Discharge: 2017-09-25 | Disposition: A | Discharge status: Home / Self Care | Source: Home / Self Care

## 2017-09-25 ENCOUNTER — Encounter (HOSPITAL_COMMUNITY): Payer: Self-pay

## 2017-09-25 ENCOUNTER — Emergency Department (HOSPITAL_COMMUNITY)

## 2017-09-25 DIAGNOSIS — R0602 Shortness of breath: Secondary | ICD-10-CM | POA: Insufficient documentation

## 2017-09-25 DIAGNOSIS — Z794 Long term (current) use of insulin: Secondary | ICD-10-CM | POA: Insufficient documentation

## 2017-09-25 DIAGNOSIS — I509 Heart failure, unspecified: Secondary | ICD-10-CM | POA: Diagnosis not present

## 2017-09-25 DIAGNOSIS — R1110 Vomiting, unspecified: Secondary | ICD-10-CM | POA: Insufficient documentation

## 2017-09-25 DIAGNOSIS — I252 Old myocardial infarction: Secondary | ICD-10-CM | POA: Insufficient documentation

## 2017-09-25 DIAGNOSIS — Z5321 Procedure and treatment not carried out due to patient leaving prior to being seen by health care provider: Secondary | ICD-10-CM | POA: Insufficient documentation

## 2017-09-25 DIAGNOSIS — F17210 Nicotine dependence, cigarettes, uncomplicated: Secondary | ICD-10-CM | POA: Diagnosis not present

## 2017-09-25 DIAGNOSIS — E119 Type 2 diabetes mellitus without complications: Secondary | ICD-10-CM | POA: Insufficient documentation

## 2017-09-25 DIAGNOSIS — I110 Hypertensive heart disease with heart failure: Secondary | ICD-10-CM | POA: Insufficient documentation

## 2017-09-25 DIAGNOSIS — J449 Chronic obstructive pulmonary disease, unspecified: Secondary | ICD-10-CM | POA: Diagnosis not present

## 2017-09-25 DIAGNOSIS — Z7901 Long term (current) use of anticoagulants: Secondary | ICD-10-CM | POA: Insufficient documentation

## 2017-09-25 DIAGNOSIS — J45909 Unspecified asthma, uncomplicated: Secondary | ICD-10-CM | POA: Diagnosis not present

## 2017-09-25 DIAGNOSIS — R197 Diarrhea, unspecified: Secondary | ICD-10-CM

## 2017-09-25 DIAGNOSIS — Z8521 Personal history of malignant neoplasm of larynx: Secondary | ICD-10-CM | POA: Insufficient documentation

## 2017-09-25 DIAGNOSIS — Z79899 Other long term (current) drug therapy: Secondary | ICD-10-CM | POA: Insufficient documentation

## 2017-09-25 LAB — URINALYSIS, ROUTINE W REFLEX MICROSCOPIC
BILIRUBIN URINE: NEGATIVE
Bilirubin Urine: NEGATIVE
Glucose, UA: 50 mg/dL — AB
Glucose, UA: 50 mg/dL — AB
HGB URINE DIPSTICK: NEGATIVE
Hgb urine dipstick: NEGATIVE
KETONES UR: NEGATIVE mg/dL
KETONES UR: NEGATIVE mg/dL
LEUKOCYTES UA: NEGATIVE
Nitrite: NEGATIVE
Nitrite: NEGATIVE
PH: 7 (ref 5.0–8.0)
Protein, ur: 100 mg/dL — AB
Protein, ur: 100 mg/dL — AB
Specific Gravity, Urine: 1.023 (ref 1.005–1.030)
Specific Gravity, Urine: 1.023 (ref 1.005–1.030)
pH: 7 (ref 5.0–8.0)

## 2017-09-25 LAB — COMPREHENSIVE METABOLIC PANEL
ALBUMIN: 3.5 g/dL (ref 3.5–5.0)
ALBUMIN: 3.3 g/dL — AB (ref 3.5–5.0)
ALK PHOS: 65 U/L (ref 38–126)
ALT: 72 U/L — AB (ref 17–63)
ALT: 63 U/L (ref 17–63)
ANION GAP: 10 (ref 5–15)
AST: 51 U/L — ABNORMAL HIGH (ref 15–41)
AST: 43 U/L — AB (ref 15–41)
Alkaline Phosphatase: 64 U/L (ref 38–126)
Anion gap: 11 (ref 5–15)
BILIRUBIN TOTAL: 0.6 mg/dL (ref 0.3–1.2)
BUN: 10 mg/dL (ref 6–20)
BUN: 12 mg/dL (ref 6–20)
CALCIUM: 9.3 mg/dL (ref 8.9–10.3)
CHLORIDE: 101 mmol/L (ref 101–111)
CO2: 24 mmol/L (ref 22–32)
CO2: 25 mmol/L (ref 22–32)
CREATININE: 0.61 mg/dL (ref 0.61–1.24)
Calcium: 9.3 mg/dL (ref 8.9–10.3)
Chloride: 100 mmol/L — ABNORMAL LOW (ref 101–111)
Creatinine, Ser: 0.66 mg/dL (ref 0.61–1.24)
GFR calc Af Amer: >60 mL/min (ref >60)
GFR calc Af Amer: >60 mL/min (ref >60)
GFR calc non Af Amer: >60 mL/min (ref >60)
GFR calc non Af Amer: >60 mL/min (ref >60)
GLUCOSE: 217 mg/dL — AB (ref 65–99)
GLUCOSE: 219 mg/dL — AB (ref 65–99)
POTASSIUM: 3.5 mmol/L (ref 3.5–5.1)
Potassium: 3.7 mmol/L (ref 3.5–5.1)
SODIUM: 135 mmol/L (ref 135–145)
Sodium: 136 mmol/L (ref 135–145)
Total Bilirubin: 0.6 mg/dL (ref 0.3–1.2)
Total Protein: 8.3 g/dL — ABNORMAL HIGH (ref 6.5–8.1)
Total Protein: 7.7 g/dL (ref 6.5–8.1)

## 2017-09-25 LAB — CBC WITH DIFFERENTIAL/PLATELET
BASOS ABS: 0 10*3/uL (ref 0.0–0.1)
BASOS PCT: 0 %
EOS ABS: 0.1 10*3/uL (ref 0.0–0.7)
EOS PCT: 1 %
HEMATOCRIT: 45.6 % (ref 39.0–52.0)
Hemoglobin: 15.6 g/dL (ref 13.0–17.0)
Lymphocytes Relative: 27 %
Lymphs Abs: 3.5 10*3/uL (ref 0.7–4.0)
MCH: 28.8 pg (ref 26.0–34.0)
MCHC: 34.2 g/dL (ref 30.0–36.0)
MCV: 84.3 fL (ref 78.0–100.0)
MONO ABS: 1.4 10*3/uL — AB (ref 0.1–1.0)
Monocytes Relative: 11 %
Neutro Abs: 7.6 10*3/uL (ref 1.7–7.7)
Neutrophils Relative %: 61 %
PLATELETS: 396 10*3/uL (ref 150–400)
RBC: 5.41 MIL/uL (ref 4.22–5.81)
RDW: 13.4 % (ref 11.5–15.5)
WBC: 12.7 10*3/uL — ABNORMAL HIGH (ref 4.0–10.5)

## 2017-09-25 LAB — CBC
HCT: 47.2 % (ref 39.0–52.0)
HEMOGLOBIN: 15.8 g/dL (ref 13.0–17.0)
MCH: 28.2 pg (ref 26.0–34.0)
MCHC: 33.5 g/dL (ref 30.0–36.0)
MCV: 84.1 fL (ref 78.0–100.0)
PLATELETS: 423 10*3/uL — AB (ref 150–400)
RBC: 5.61 MIL/uL (ref 4.22–5.81)
RDW: 13.2 % (ref 11.5–15.5)
WBC: 13.2 10*3/uL — ABNORMAL HIGH (ref 4.0–10.5)

## 2017-09-25 LAB — LIPASE, BLOOD: Lipase: 39 U/L (ref 11–51)

## 2017-09-25 MED ORDER — LIDOCAINE-EPINEPHRINE 1 %-1:100000 IJ SOLN
10.0000 mL | Freq: Once | INTRAMUSCULAR | Status: AC
Start: 2017-09-25 — End: 2017-09-25
  Administered 2017-09-25: 10 mL
  Filled 2017-09-25: qty 10

## 2017-09-25 MED ORDER — MORPHINE SULFATE (PF) 4 MG/ML IV SOLN
4.0000 mg | Freq: Once | INTRAVENOUS | Status: AC
  Administered 2017-09-25: 4 mg via INTRAVENOUS
  Filled 2017-09-25: qty 1

## 2017-09-25 MED ORDER — ALBUTEROL (5 MG/ML) CONTINUOUS INHALATION SOLN
10.0000 mg/h | INHALATION_SOLUTION | RESPIRATORY_TRACT | Status: AC
  Administered 2017-09-25: 10 mg/h via RESPIRATORY_TRACT
  Filled 2017-09-25: qty 20

## 2017-09-25 MED ORDER — BENZONATATE 100 MG PO CAPS
200.0000 mg | ORAL_CAPSULE | Freq: Once | ORAL | Status: AC
  Administered 2017-09-25: 200 mg via ORAL
  Filled 2017-09-25: qty 2

## 2017-09-25 MED ORDER — ONDANSETRON HCL 4 MG/2ML IJ SOLN
4.0000 mg | Freq: Once | INTRAMUSCULAR | Status: AC
  Administered 2017-09-25: 4 mg via INTRAVENOUS
  Filled 2017-09-25: qty 2

## 2017-09-25 MED ORDER — SODIUM CHLORIDE 0.9 % IV BOLUS (SEPSIS)
1000.0000 mL | Freq: Once | INTRAVENOUS | Status: AC
  Administered 2017-09-25: 1000 mL via INTRAVENOUS

## 2017-09-25 MED ORDER — ONDANSETRON 4 MG PO TBDP: 4.0000 mg | ORAL_TABLET | Freq: Three times a day (TID) | ORAL | 0 refills | Status: DC | PRN

## 2017-09-25 NOTE — ED Triage Notes (Signed)
Reports of nausea, vomiting and diarrhea with mid back pain that started this morning at 0100. States he is unable to keep morphine down and now having back pain. Patient currently on oxygen denies shortness of breath.

## 2017-09-25 NOTE — ED Notes (Signed)
Patient left ED per registration.

## 2017-09-25 NOTE — Discharge Instructions (Signed)
Your testing has not shown any specific findings,  You can take Zofran for nausea Drink plenty of fluids ER for worsening symptoms.

## 2017-09-25 NOTE — ED Provider Notes (Signed)
[facility] EMERGENCY DEPARTMENT Provider Note   CSN: [id number] Arrival date & time: 09/25/17  2016     History   Chief Complaint Chief Complaint  Patient presents with  . Shortness of Breath    HPI Roger Flores is a 56 y.o. male.  HPI  The patient's is a 56 year old male, he has a known history of laryngeal cancer, CHF, COPD, diabetes, heart attack and stroke, the patient is on chronic opiates including daily morphine for his chronic pain, states that he is on hospice because of his end-stage lung disease and is on chronic 3 L of oxygen.  He reports that for 2 days he has had vomiting and diarrhea, multiple episodes of each, has been unable to hold down his morphine today and has had progressive pain in his chest and his back.  He denies any fevers but states that he has been coughing, has mucus rattling around in his chest.  He is demanding morphine, albuterol and something for his stomach.  Past Medical History:  Diagnosis Date  . Anxiety   . Arthritis    knees and back  . Asthma   . Cancer ([place])    Larynx  . Cataract   . CHF (congestive heart failure) ([place])   . COPD (chronic obstructive pulmonary disease) ([place])   . Depression   . Diabetes mellitus without complication ([place])   . Emphysema of lung ([place])   . GERD (gastroesophageal reflux disease)   . Hyperlipidemia   . Hypertension   . Kidney disease   . Liver disease   . Lung mass   . Myocardial infarction ([place])   . Opioid withdrawal ([place])   . Oxygen deficiency   . Stroke [facility])     Patient Active Problem List   Diagnosis Date Noted  . Chronic pain 07/24/2017  . Stroke ([place]) 07/21/2017  . History of cancer of larynx 07/21/2017  . Self-catheterizes urinary bladder 07/21/2017  . 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 DNR no code (do not resuscitate) 07/21/2017  . Insulin dependent diabetes mellitus ([place]) 07/21/2017  . Pressure injury of skin 07/19/2017  . Chest pain 07/18/2017  . COPD, very  severe ([place]) 07/18/2017  . Unstable angina pectoris ([place]) 07/01/2017    Past Surgical History:  Procedure Laterality Date  . NECK SURGERY    . THROAT SURGERY         Home Medications    Prior to Admission medications   Medication Sig Start Date End Date Taking? Authorizing Provider  albuterol (PROVENTIL) (2.5 MG/3ML) 0.083% nebulizer solution Take 3 mLs (2.5 mg total) by nebulization every 6 (six) hours as needed for wheezing or shortness of breath. 07/20/17   Kathie Dike, MD  apixaban (ELIQUIS) 5 MG TABS tablet Take 1 tablet (5 mg total) by mouth 2 (two) times daily. 07/04/17   Jola Schmidt, MD  azithromycin (ZITHROMAX) 250 MG tablet Take 1 tablet (250 mg total) by mouth daily. Take 1 every day until finished. Patient not taking: Reported on 08/31/2017 08/29/17   Mesner, Corene Cornea, MD  docusate sodium (COLACE) 100 MG capsule Take 100 mg by mouth 2 (two) times daily as needed for constipation.    [provider]  doxycycline (VIBRAMYCIN) 100 MG capsule Take 1 capsule (100 mg total) by mouth 2 (two) times daily. 09/01/17   Nat Christen, MD  feeding supplement, GLUCERNA SHAKE, (GLUCERNA SHAKE) LIQD Take 237 mLs by mouth 2 (two) times daily.     [provider]  furosemide (LASIX) 40 MG tablet Take 40 mg by mouth 3 (three) times daily as needed for fluid.     [provider]  ibuprofen (ADVIL,MOTRIN) 800 MG tablet Take 800 mg by mouth every 8 (eight) hours as needed for moderate pain.    [provider]  insulin aspart (NOVOLOG) 100 UNIT/ML injection Inject 8-20 Units into the skin 4 (four) times daily. Patient is on a sliding scale    [provider]  LORazepam (ATIVAN) 0.5 MG tablet Take 1 tablet (0.5 mg total) by mouth every 8 (eight) hours as needed for anxiety. 07/22/17   Duffy Bruce, MD  morphine (MS CONTIN) 30 MG 12 hr tablet Take 1 tablet (30 mg total) by mouth every 6 (six) hours. 07/22/17   Duffy Bruce, MD  morphine (MSIR) 15 MG tablet  Take 15 mg by mouth every 6 (six) hours as needed for severe pain.    [provider]  nitroGLYCERIN (NITROSTAT) 0.4 MG SL tablet Place 1 tablet under the tongue daily as needed for chest pain. 05/23/17   [provider]  ondansetron (ZOFRAN ODT) 4 MG disintegrating tablet Take 1 tablet (4 mg total) by mouth every 8 (eight) hours as needed for nausea. 09/25/17   Noemi Chapel, MD  ondansetron (ZOFRAN) 4 MG tablet Take 1 tablet (4 mg total) by mouth every 8 (eight) hours as needed for nausea or vomiting. 08/29/17   Mesner, Corene Cornea, MD  OXYGEN Inhale 3 L into the lungs daily. At home    [provider]  prochlorperazine (COMPAZINE) 5 MG tablet Take 5 mg by mouth every 6 (six) hours as needed for nausea or vomiting.    [provider]  tamsulosin (FLOMAX) 0.4 MG CAPS capsule Take 0.4 mg by mouth daily.    [provider]    Family History Family History  Problem Relation Age of Onset  . Arthritis Mother   . Asthma Mother   . COPD Mother   . Depression Mother   . Hyperlipidemia Mother   . Hypertension Mother   . Miscarriages / [place] Mother   . Alzheimer's disease Mother   . Alcohol abuse Father   . Drug abuse Father   . Early death Father 14       gunshot  . Heart disease Brother 53  . Early death Son        SIDS  . Early death Son        MVA    Social History Social History  Substance Use Topics  . Smoking status: Current Every Day Smoker    Packs/day: 3.00    Types: Cigarettes    Start date: 11/25/1977  . Smokeless tobacco: Never Used  . Alcohol use No     Allergies   Patient has no known allergies.   Review of Systems Review of Systems  All other systems reviewed and are negative.    Physical Exam Updated Vital Signs BP 134/63 (BP Location: Left Arm)   Pulse (!) 102   Temp 97.8 F (36.6 C) (Oral)   Resp 20   Ht 5\' 6"  (1.676 m)   Wt 92.5 kg (204 lb)   SpO2 100%   BMI 32.93 kg/m   Physical Exam    Constitutional: He appears well-developed and well-nourished. No distress.  HENT:  Head: Normocephalic and atraumatic.  Mouth/Throat: Oropharynx is clear and moist. No oropharyngeal exudate.  Eyes: Pupils are equal, round, and reactive to light. Conjunctivae and EOM are normal. Right eye  exhibits no discharge. Left eye exhibits no discharge. No scleral icterus.  Neck: Normal range of motion. Neck supple. No JVD present. No thyromegaly present.  Cardiovascular: Regular rhythm, normal heart sounds and intact distal pulses.  Exam reveals no gallop and no friction rub.   No murmur heard. tachycardia  Pulmonary/Chest: Effort normal. No respiratory distress. He has wheezes. He has no rales.  Diffuse rhonchi  Abdominal: Soft. Bowel sounds are normal. He exhibits no distension and no mass. There is tenderness ( Mild diffuse tenderness, no guarding or peritoneal signs, nonfocal).  Musculoskeletal: Normal range of motion. He exhibits no edema or tenderness.  Lymphadenopathy:    He has no cervical adenopathy.  Neurological: He is alert. Coordination normal.  Skin: Skin is warm and dry. No rash noted. No erythema.  Psychiatric: He has a normal mood and affect. His behavior is normal.  Nursing note and vitals reviewed.    ED Treatments / Results  Labs (all labs ordered are listed, but only abnormal results are displayed) Labs Reviewed  CBC WITH DIFFERENTIAL/PLATELET - Abnormal; Notable for the following:       Result Value   WBC 12.7 (*)    Monocytes Absolute 1.4 (*)    All other components within normal limits  COMPREHENSIVE METABOLIC PANEL - Abnormal; Notable for the following:    Glucose, Bld 219 (*)    Albumin 3.3 (*)    AST 43 (*)    All other components within normal limits  URINALYSIS, ROUTINE W REFLEX MICROSCOPIC - Abnormal; Notable for the following:    APPearance HAZY (*)    Glucose, UA 50 (*)    Protein, ur 100 (*)    Bacteria, UA RARE (*)    Squamous Epithelial / LPF 0-5 (*)     All other components within normal limits   ED ECG REPORT  I personally interpreted this EKG   Date: 09/25/2017   Rate: 109  Rhythm: sinus tachycardia  QRS Axis: normal  Intervals: normal  ST/T Wave abnormalities: nonspecific T wave changes  Conduction Disutrbances:right bundle branch block  Narrative Interpretation:   Old EKG Reviewed: none available  Radiology Dg Chest 2 View  Result Date: 09/25/2017 CLINICAL DATA:  Increased dyspnea over the past 24 hours. EXAM: CHEST  2 VIEW COMPARISON:  09/01/2017. FINDINGS: The heart size and mediastinal contours are within normal limits. Aortic atherosclerosis at the arch without aneurysm. Left basilar atelectasis and/or scarring. No pneumonic consolidation nor overt pulmonary edema. No pneumothorax or pleural effusion. The visualized skeletal structures are unremarkable. IMPRESSION: 1. Aortic atherosclerosis. 2. Left basilar atelectasis and/or scarring. No acute pulmonary disease. Electronically Signed   By: Ashley Royalty M.D.   On: 09/25/2017 20:55    Procedures Procedures (including critical care time)  Medications Ordered in ED Medications  albuterol (PROVENTIL,VENTOLIN) solution continuous neb (0 mg/hr Nebulization Stopped 09/25/17 2346)  ondansetron (ZOFRAN) injection 4 mg (4 mg Intravenous Given 09/25/17 2157)  sodium chloride 0.9 % bolus 1,000 mL (0 mLs Intravenous Stopped 09/25/17 2345)  benzonatate (TESSALON) capsule 200 mg (200 mg Oral Given 09/25/17 2156)  morphine 4 MG/ML injection 4 mg (4 mg Intravenous Given 09/25/17 2240)  lidocaine-EPINEPHrine (XYLOCAINE W/EPI) 1 %-1:100000 (with pres) injection 10 mL (10 mLs Other Given 09/25/17 2242)     Initial Impression / Assessment and Plan / ED Course  I have reviewed the triage vital signs and the nursing notes.  Pertinent labs & imaging results that were available during my care of the patient were reviewed  by me and considered in my medical decision making (see chart for  details).  Clinical Course as of Sep 25 2346  Thu Sep 25, 2017  2328 DG Chest 2 View [BM]    Clinical Course User Index [BM] Noemi Chapel, MD    Despite the patient's lack of anything to eat or drink over the last 2 days he has, moist mucous membranes, however it is to the point where he is not taking his oral morphine thus he may be nauseated.  Will give IV fluids, antiemetics, check labs and a chest x-ray, EKG is nonischemic and shows a right bundle branch block.  Thankfully the labs are rather unremarkable except for a slight leukocytosis.  The urinalysis is not consistent with an infection, conference of metabolic panel shows mild hyperglycemia but no other findings and the x-ray shows no signs of pneumonia.  The patient is done very well, his heart rate is down to 100, he has been given fluids, nausea medicines and a dose of morphine.  He states that the Zofran helped him and he request some for home.  I think this is reasonable given his current situation.  He appears stable for discharge  Final Clinical Impressions(s) / ED Diagnoses   Final diagnoses:  Vomiting and diarrhea  Shortness of breath    New Prescriptions New Prescriptions   ONDANSETRON (ZOFRAN ODT) 4 MG DISINTEGRATING TABLET    Take 1 tablet (4 mg total) by mouth every 8 (eight) hours as needed for nausea.     Noemi Chapel, MD 09/25/17 [phone]

## 2017-09-25 NOTE — ED Triage Notes (Signed)
Pt states he has had shortness of breath that started last night at 1am. Pt states he uses oxygen at home on 3 L. Pt oxygen saturation is 93 at this time. Pt states he has bilateral lateral chest pain. Pt describes his pain as sharp stabbing with inspiration. Pt states he has had nausea, vomiting, diarrhea since yesterday afternoon. Pt states his last bowel movement was today at 1pm.   Pt was here today at 1340 and has already had blood work today.

## 2017-09-28 ENCOUNTER — Other Ambulatory Visit: Payer: Self-pay

## 2017-09-28 ENCOUNTER — Emergency Department (HOSPITAL_COMMUNITY)
Admission: EM | Admit: 2017-09-28 | Discharge: 2017-09-28 | Disposition: A | Discharge status: Home / Self Care | Attending: Emergency Medicine | Admitting: Emergency Medicine

## 2017-09-28 ENCOUNTER — Encounter (HOSPITAL_COMMUNITY): Payer: Self-pay | Admitting: Emergency Medicine

## 2017-09-28 ENCOUNTER — Emergency Department (HOSPITAL_COMMUNITY)

## 2017-09-28 DIAGNOSIS — R0602 Shortness of breath: Secondary | ICD-10-CM | POA: Diagnosis present

## 2017-09-28 DIAGNOSIS — I509 Heart failure, unspecified: Secondary | ICD-10-CM | POA: Insufficient documentation

## 2017-09-28 DIAGNOSIS — Z79899 Other long term (current) drug therapy: Secondary | ICD-10-CM | POA: Diagnosis not present

## 2017-09-28 DIAGNOSIS — F17210 Nicotine dependence, cigarettes, uncomplicated: Secondary | ICD-10-CM | POA: Diagnosis not present

## 2017-09-28 DIAGNOSIS — Z794 Long term (current) use of insulin: Secondary | ICD-10-CM | POA: Insufficient documentation

## 2017-09-28 DIAGNOSIS — J441 Chronic obstructive pulmonary disease with (acute) exacerbation: Secondary | ICD-10-CM | POA: Insufficient documentation

## 2017-09-28 DIAGNOSIS — J45909 Unspecified asthma, uncomplicated: Secondary | ICD-10-CM | POA: Diagnosis not present

## 2017-09-28 DIAGNOSIS — E119 Type 2 diabetes mellitus without complications: Secondary | ICD-10-CM | POA: Diagnosis not present

## 2017-09-28 DIAGNOSIS — I110 Hypertensive heart disease with heart failure: Secondary | ICD-10-CM | POA: Diagnosis not present

## 2017-09-28 LAB — BASIC METABOLIC PANEL
Anion gap: 10 (ref 5–15)
BUN: 10 mg/dL (ref 6–20)
CALCIUM: 9.1 mg/dL (ref 8.9–10.3)
CO2: 21 mmol/L — AB (ref 22–32)
CREATININE: 0.6 mg/dL — AB (ref 0.61–1.24)
Chloride: 101 mmol/L (ref 101–111)
GFR calc non Af Amer: >60 mL/min (ref >60)
Glucose, Bld: 220 mg/dL — ABNORMAL HIGH (ref 65–99)
Potassium: 4.4 mmol/L (ref 3.5–5.1)
SODIUM: 132 mmol/L — AB (ref 135–145)

## 2017-09-28 LAB — CBC
HCT: 49.2 % (ref 39.0–52.0)
Hemoglobin: 17 g/dL (ref 13.0–17.0)
MCH: 29 pg (ref 26.0–34.0)
MCHC: 34.6 g/dL (ref 30.0–36.0)
MCV: 83.8 fL (ref 78.0–100.0)
PLATELETS: 357 10*3/uL (ref 150–400)
RBC: 5.87 MIL/uL — AB (ref 4.22–5.81)
RDW: 13.3 % (ref 11.5–15.5)
WBC: 13.5 10*3/uL — ABNORMAL HIGH (ref 4.0–10.5)

## 2017-09-28 LAB — I-STAT TROPONIN, ED: TROPONIN I, POC: 0 ng/mL (ref 0.00–0.08)

## 2017-09-28 MED ORDER — ALBUTEROL SULFATE (2.5 MG/3ML) 0.083% IN NEBU
INHALATION_SOLUTION | RESPIRATORY_TRACT | Status: AC
  Administered 2017-09-28: 10 mg
  Filled 2017-09-28: qty 12

## 2017-09-28 MED ORDER — METHYLPREDNISOLONE SODIUM SUCC 125 MG IJ SOLR
125.0000 mg | Freq: Once | INTRAMUSCULAR | Status: AC
  Administered 2017-09-28: 125 mg via INTRAVENOUS
  Filled 2017-09-28: qty 2

## 2017-09-28 MED ORDER — MORPHINE SULFATE (PF) 4 MG/ML IV SOLN
4.0000 mg | Freq: Once | INTRAVENOUS | Status: AC
  Administered 2017-09-28: 4 mg via INTRAVENOUS
  Filled 2017-09-28: qty 1

## 2017-09-28 MED ORDER — IPRATROPIUM BROMIDE 0.02 % IN SOLN
0.5000 mg | Freq: Once | RESPIRATORY_TRACT | Status: AC
  Administered 2017-09-28: 0.5 mg via RESPIRATORY_TRACT
  Filled 2017-09-28: qty 2.5

## 2017-09-28 MED ORDER — ALBUTEROL (5 MG/ML) CONTINUOUS INHALATION SOLN
10.0000 mg/h | INHALATION_SOLUTION | Freq: Once | RESPIRATORY_TRACT | Status: AC
  Administered 2017-09-28: 10 mg/h via RESPIRATORY_TRACT
  Filled 2017-09-28: qty 0.5

## 2017-09-28 MED ORDER — PREDNISONE 10 MG (21) PO TBPK: ORAL_TABLET | ORAL | 0 refills | Status: DC

## 2017-09-28 NOTE — Discharge Instructions (Signed)
Continue your current medications, take the steroids to see if that helps with the wheezing.  Follow-up with your doctor next week to be rechecked

## 2017-09-28 NOTE — ED Triage Notes (Signed)
Pt. Stated, Im in the care of Hospice and I went to [facility] on Thursday and Im no better in fact Im worse, they suggest I come here. Real SOB but I have end stage COPD.

## 2017-09-28 NOTE — ED Provider Notes (Signed)
[facility] EMERGENCY DEPARTMENT Provider Note   CSN: [id number] Arrival date & time: 09/28/17  1338     History   Chief Complaint Chief Complaint  Patient presents with  . Chest Pain  . Shortness of Breath    HPI Roger Flores is a 56 y.o. male.  HPI Patient presents to the emergency room for evaluation of shortness of breath and chest pain.  Patient has a history of COPD and laryngeal cancer.  Patient states he is on hospice for his COPD.  He unfortunately continues to smoke cigarettes.  Patient states his symptoms actually started earlier in the week.  He was actually seen in [facility] emergency department on November 1.  At that time the patient was treated for issues with vomiting and diarrhea but he was also wheezing and was given breathing treatments.  Patient states he felt a little bit better after his treatment at [facility] but his symptoms have returned.  Patient feels like he needs a shot of steroids and morphine for his pain.  Usually that does the trick for him.  He has felt chilled and feverish but has not measured any fevers.  He is not currently having any chest pain now.  It occurs when he coughs and breathes.  He denies any leg swelling.  No current vomiting or diarrhea. Past Medical History:  Diagnosis Date  . Anxiety   . Arthritis    knees and back  . Asthma   . Cancer ([place])    Larynx  . Cataract   . CHF (congestive heart failure) ([place])   . COPD (chronic obstructive pulmonary disease) ([place])   . Depression   . Diabetes mellitus without complication ([place])   . Emphysema of lung ([place])   . GERD (gastroesophageal reflux disease)   . Hyperlipidemia   . Hypertension   . Kidney disease   . Liver disease   . Lung mass   . Myocardial infarction ([place])   . Opioid withdrawal ([place])   . Oxygen deficiency   . Stroke [facility])     Patient Active Problem List   Diagnosis Date Noted  . Chronic pain 07/24/2017  . Stroke ([place]) 07/21/2017  . History  of cancer of larynx 07/21/2017  . Self-catheterizes urinary bladder 07/21/2017  . 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 DNR no code (do not resuscitate) 07/21/2017  . Insulin dependent diabetes mellitus ([place]) 07/21/2017  . Pressure injury of skin 07/19/2017  . Chest pain 07/18/2017  . COPD, very severe ([place]) 07/18/2017  . Unstable angina pectoris ([place]) 07/01/2017    Past Surgical History:  Procedure Laterality Date  . NECK SURGERY    . THROAT SURGERY         Home Medications    Prior to Admission medications   Medication Sig Start Date End Date Taking? Authorizing Provider  albuterol (PROVENTIL) (2.5 MG/3ML) 0.083% nebulizer solution Take 3 mLs (2.5 mg total) by nebulization every 6 (six) hours as needed for wheezing or shortness of breath. 07/20/17  Yes Kathie Dike, MD  apixaban (ELIQUIS) 5 MG TABS tablet Take 1 tablet (5 mg total) by mouth 2 (two) times daily. 07/04/17  Yes Jola Schmidt, MD  docusate sodium (COLACE) 100 MG capsule Take 100 mg by mouth 2 (two) times daily as needed for constipation.   Yes [provider]  doxycycline (VIBRAMYCIN) 100 MG capsule Take 1 capsule (100 mg total) by mouth 2 (two) times daily. 09/01/17  Yes Lacinda Axon,  Aaron Edelman, MD  feeding supplement, GLUCERNA SHAKE, (GLUCERNA SHAKE) LIQD Take 237 mLs 5 (five) times daily by mouth.    Yes [provider]  furosemide (LASIX) 40 MG tablet Take 60 mg daily by mouth.    Yes [provider]  ibuprofen (ADVIL,MOTRIN) 800 MG tablet Take 800 mg by mouth every 8 (eight) hours as needed for moderate pain.   Yes [provider]  insulin NPH Human (HUMULIN N,NOVOLIN N) 100 UNIT/ML injection Inject 8 Units 3 (three) times daily before meals into the skin.   Yes [provider]  insulin regular (NOVOLIN R,HUMULIN R) 100 units/mL injection Inject 18 Units at bedtime into the skin.   Yes [provider]  LORazepam (ATIVAN) 0.5 MG tablet Take 1 tablet (0.5 mg  total) by mouth every 8 (eight) hours as needed for anxiety. 07/22/17  Yes Duffy Bruce, MD  Melatonin 3 MG TABS Take 3 mg at bedtime by mouth.   Yes [provider]  morphine (MS CONTIN) 60 MG 12 hr tablet Take 60 mg every 12 (twelve) hours by mouth.   Yes [provider]  morphine (MSIR) 15 MG tablet Take 15 mg 3 (three) times daily by mouth.    Yes [provider]  morphine (ROXANOL) 20 MG/ML concentrated solution Take 20 mg every 6 (six) hours as needed by mouth for breakthrough pain.   Yes [provider]  nitroGLYCERIN (NITROSTAT) 0.4 MG SL tablet Place 1 tablet under the tongue daily as needed for chest pain. 05/23/17  Yes [provider]  ondansetron (ZOFRAN) 4 MG tablet Take 1 tablet (4 mg total) by mouth every 8 (eight) hours as needed for nausea or vomiting. 08/29/17  Yes Mesner, Corene Cornea, MD  OXYGEN Inhale 3 L continuous into the lungs. At home    Yes [provider]  prochlorperazine (COMPAZINE) 5 MG tablet Take 5 mg by mouth every 6 (six) hours as needed for nausea or vomiting.   Yes [provider]  tamsulosin (FLOMAX) 0.4 MG CAPS capsule Take 0.4 mg daily after breakfast by mouth.    Yes [provider]  morphine (MS CONTIN) 30 MG 12 hr tablet Take 1 tablet (30 mg total) by mouth every 6 (six) hours. Patient not taking: Reported on 09/28/2017 07/22/17   Duffy Bruce, MD  ondansetron (ZOFRAN ODT) 4 MG disintegrating tablet Take 1 tablet (4 mg total) by mouth every 8 (eight) hours as needed for nausea. Patient not taking: Reported on 09/28/2017 09/25/17   Noemi Chapel, MD  predniSONE (STERAPRED UNI-PAK 21 TAB) 10 MG (21) TBPK tablet Take 6 tabs by mouth daily  for 2 days, then 5 tabs for 2 days, then 4 tabs for 2 days, then 3 tabs for 2 days, 2 tabs for 2 days, then 1 tab by mouth daily for 2 days 09/28/17   Dorie Rank, MD    Family History Family History  Problem Relation Age of Onset  . Arthritis Mother   .  Asthma Mother   . COPD Mother   . Depression Mother   . Hyperlipidemia Mother   . Hypertension Mother   . Miscarriages / [place] Mother   . Alzheimer's disease Mother   . Alcohol abuse Father   . Drug abuse Father   . Early death Father 52       gunshot  . Heart disease Brother 57  . Early death Son        SIDS  . Early death Son  MVA    Social History Social History   Tobacco Use  . Smoking status: Current Every Day Smoker    Packs/day: 3.00    Types: Cigarettes    Start date: 11/25/1977  . Smokeless tobacco: Never Used  Substance Use Topics  . Alcohol use: No  . Drug use: No     Allergies   Patient has no known allergies.   Review of Systems Review of Systems  All other systems reviewed and are negative.    Physical Exam Updated Vital Signs BP (!) 132/94   Pulse (!) 105   Temp 97.6 F (36.4 C) (Oral)   Resp 18   Ht 1.676 m (5\' 6" )   Wt 92.5 kg (204 lb)   SpO2 99%   BMI 32.93 kg/m   Physical Exam  Constitutional: He appears well-developed and well-nourished. No distress.  HENT:  Head: Normocephalic and atraumatic.  Right Ear: External ear normal.  Left Ear: External ear normal.  Eyes: Conjunctivae are normal. Right eye exhibits no discharge. Left eye exhibits no discharge. No scleral icterus.  Neck: Neck supple. No tracheal deviation present.  Cardiovascular: Normal rate, regular rhythm and intact distal pulses.  Pulmonary/Chest: Effort normal. No accessory muscle usage or stridor. No tachypnea. No respiratory distress. He has no decreased breath sounds. He has wheezes. He has no rales. He exhibits no crepitus, no edema, no deformity, no swelling and no retraction.  Abdominal: Soft. Bowel sounds are normal. He exhibits no distension. There is no tenderness. There is no rebound and no guarding.  Musculoskeletal: He exhibits no tenderness.       Right lower leg: He exhibits no edema.       Left lower leg: He exhibits no edema.    Neurological: He is alert. He has normal strength. No cranial nerve deficit (no facial droop, extraocular movements intact, no slurred speech) or sensory deficit. He exhibits normal muscle tone. He displays no seizure activity. Coordination normal.  Skin: Skin is warm and dry. No rash noted.  Psychiatric: He has a normal mood and affect.  Nursing note and vitals reviewed.    ED Treatments / Results  Labs (all labs ordered are listed, but only abnormal results are displayed) Labs Reviewed  BASIC METABOLIC PANEL - Abnormal; Notable for the following components:      Result Value   Sodium 132 (*)    CO2 21 (*)    Glucose, Bld 220 (*)    Creatinine, Ser 0.60 (*)    All other components within normal limits  CBC - Abnormal; Notable for the following components:   WBC 13.5 (*)    RBC 5.87 (*)    All other components within normal limits  I-STAT TROPONIN, ED    EKG  EKG Interpretation  Date/Time:  Sunday September 28 2017 13:53:14 EST Ventricular Rate:  100 PR Interval:  130 QRS Duration: 130 QT Interval:  368 QTC Calculation: 474 R Axis:   79 Text Interpretation:  Normal sinus rhythm Right bundle branch block Abnormal ECG No significant change since last tracing Confirmed by Dorie Rank [phone]) on 09/28/2017 6:09:46 PM       Radiology Dg Chest 2 View  Result Date: 09/28/2017 CLINICAL DATA:  56 year old male with shortness of breath. EXAM: CHEST  2 VIEW COMPARISON:  09/25/2017 FINDINGS: The heart size and mediastinal contours are within normal limits. Left basilar atelectasis/ scarring again noted. The lungs are otherwise clear. No pleural effusions or pneumothorax. The visualized skeletal structures are unremarkable.  IMPRESSION: No active cardiopulmonary disease. Minimal left basilar atelectasis/ scarring again noted. Electronically Signed   By: Kristopher Oppenheim M.D.   On: 09/28/2017 14:45    Procedures Procedures (including critical care time)  Medications Ordered in  ED Medications  methylPREDNISolone sodium succinate (SOLU-MEDROL) 125 mg/2 mL injection 125 mg (125 mg Intravenous Given 09/28/17 1707)  albuterol (PROVENTIL,VENTOLIN) solution continuous neb (10 mg/hr Nebulization Given 09/28/17 1648)  ipratropium (ATROVENT) nebulizer solution 0.5 mg (0.5 mg Nebulization Given 09/28/17 1648)  morphine 4 MG/ML injection 4 mg (4 mg Intravenous Given 09/28/17 1708)  albuterol (PROVENTIL) (2.5 MG/3ML) 0.083% nebulizer solution (10 mg  Given 09/28/17 1648)     Initial Impression / Assessment and Plan / ED Course  I have reviewed the triage vital signs and the nursing notes.  Pertinent labs & imaging results that were available during my care of the patient were reviewed by me and considered in my medical decision making (see chart for details).   Patient presented to the emergency room with complaints of shortness of breath.  Patient has a history of COPD.  He continues to smoke.  He was given a breathing treatment and Solu-Medrol.  At 1800, Patient states he is feeling much better now and is ready to go home.  I suspect his symptoms are related to a COPD exacerbation.  Chest x-ray and without signs of pneumonia.  Laboratory tests are otherwise reassuring.  Leukocytosis and his hyperglycemia are not clinically significant  Final Clinical Impressions(s) / ED Diagnoses   Final diagnoses:  COPD exacerbation ([place])       Dorie Rank, MD 09/28/17 [phone]

## 2017-10-10 ENCOUNTER — Encounter (HOSPITAL_COMMUNITY): Payer: Self-pay

## 2017-10-10 ENCOUNTER — Emergency Department (HOSPITAL_COMMUNITY): Payer: Medicare Other

## 2017-10-10 ENCOUNTER — Other Ambulatory Visit: Payer: Self-pay

## 2017-10-10 ENCOUNTER — Emergency Department (HOSPITAL_COMMUNITY)
Admission: EM | Admit: 2017-10-10 | Discharge: 2017-10-10 | Disposition: A | Discharge status: Home / Self Care | Payer: Medicare Other | Source: Home / Self Care | Attending: Emergency Medicine | Admitting: Emergency Medicine

## 2017-10-10 DIAGNOSIS — Z7901 Long term (current) use of anticoagulants: Secondary | ICD-10-CM

## 2017-10-10 DIAGNOSIS — Z8521 Personal history of malignant neoplasm of larynx: Secondary | ICD-10-CM

## 2017-10-10 DIAGNOSIS — E119 Type 2 diabetes mellitus without complications: Secondary | ICD-10-CM | POA: Insufficient documentation

## 2017-10-10 DIAGNOSIS — Z79899 Other long term (current) drug therapy: Secondary | ICD-10-CM

## 2017-10-10 DIAGNOSIS — F17210 Nicotine dependence, cigarettes, uncomplicated: Secondary | ICD-10-CM

## 2017-10-10 DIAGNOSIS — F419 Anxiety disorder, unspecified: Secondary | ICD-10-CM

## 2017-10-10 DIAGNOSIS — J449 Chronic obstructive pulmonary disease, unspecified: Secondary | ICD-10-CM

## 2017-10-10 DIAGNOSIS — J45909 Unspecified asthma, uncomplicated: Secondary | ICD-10-CM

## 2017-10-10 DIAGNOSIS — E1165 Type 2 diabetes mellitus with hyperglycemia: Secondary | ICD-10-CM

## 2017-10-10 DIAGNOSIS — I509 Heart failure, unspecified: Secondary | ICD-10-CM

## 2017-10-10 DIAGNOSIS — Z8673 Personal history of transient ischemic attack (TIA), and cerebral infarction without residual deficits: Secondary | ICD-10-CM | POA: Insufficient documentation

## 2017-10-10 DIAGNOSIS — I252 Old myocardial infarction: Secondary | ICD-10-CM

## 2017-10-10 DIAGNOSIS — Z9981 Dependence on supplemental oxygen: Secondary | ICD-10-CM

## 2017-10-10 DIAGNOSIS — L03116 Cellulitis of left lower limb: Secondary | ICD-10-CM | POA: Insufficient documentation

## 2017-10-10 DIAGNOSIS — A419 Sepsis, unspecified organism: Secondary | ICD-10-CM | POA: Diagnosis not present

## 2017-10-10 DIAGNOSIS — I110 Hypertensive heart disease with heart failure: Secondary | ICD-10-CM

## 2017-10-10 DIAGNOSIS — Z794 Long term (current) use of insulin: Secondary | ICD-10-CM | POA: Insufficient documentation

## 2017-10-10 DIAGNOSIS — F329 Major depressive disorder, single episode, unspecified: Secondary | ICD-10-CM | POA: Insufficient documentation

## 2017-10-10 DIAGNOSIS — R0602 Shortness of breath: Secondary | ICD-10-CM | POA: Diagnosis not present

## 2017-10-10 DIAGNOSIS — J441 Chronic obstructive pulmonary disease with (acute) exacerbation: Secondary | ICD-10-CM | POA: Insufficient documentation

## 2017-10-10 HISTORY — DX: Tobacco use: Z72.0

## 2017-10-10 LAB — URINALYSIS, ROUTINE W REFLEX MICROSCOPIC
Bacteria, UA: NONE SEEN
Bacteria, UA: NONE SEEN
Bilirubin Urine: NEGATIVE
Bilirubin Urine: NEGATIVE
Glucose, UA: >=500 mg/dL — AB
HGB URINE DIPSTICK: NEGATIVE
HGB URINE DIPSTICK: NEGATIVE
KETONES UR: 5 mg/dL — AB
Ketones, ur: NEGATIVE mg/dL
Leukocytes, UA: NEGATIVE
Leukocytes, UA: NEGATIVE
Nitrite: NEGATIVE
Nitrite: NEGATIVE
PROTEIN: 100 mg/dL — AB
PROTEIN: 30 mg/dL — AB
SPECIFIC GRAVITY, URINE: 1.037 — AB (ref 1.005–1.030)
Specific Gravity, Urine: 1.016 (ref 1.005–1.030)
Squamous Epithelial / LPF: NONE SEEN
WBC, UA: NONE SEEN WBC/hpf (ref 0–5)
pH: 7 (ref 5.0–8.0)
pH: 7 (ref 5.0–8.0)

## 2017-10-10 LAB — CBC WITH DIFFERENTIAL/PLATELET
Basophils Absolute: 0 10*3/uL (ref 0.0–0.1)
Basophils Relative: 0 %
EOS ABS: 0.1 10*3/uL (ref 0.0–0.7)
EOS PCT: 1 %
HCT: 45.5 % (ref 39.0–52.0)
Hemoglobin: 12.7 g/dL — ABNORMAL LOW (ref 13.0–17.0)
LYMPHS ABS: 1.7 10*3/uL (ref 0.7–4.0)
Lymphocytes Relative: 18 %
MCH: 24.2 pg — AB (ref 26.0–34.0)
MCHC: 27.9 g/dL — AB (ref 30.0–36.0)
MCV: 86.7 fL (ref 78.0–100.0)
MONO ABS: 1.2 10*3/uL — AB (ref 0.1–1.0)
MONOS PCT: 13 %
Neutro Abs: 6.3 10*3/uL (ref 1.7–7.7)
Neutrophils Relative %: 68 %
PLATELETS: 232 10*3/uL (ref 150–400)
RBC: 5.25 MIL/uL (ref 4.22–5.81)
RDW: 13.5 % (ref 11.5–15.5)
WBC: 9.2 10*3/uL (ref 4.0–10.5)

## 2017-10-10 LAB — BASIC METABOLIC PANEL
Anion gap: 9 (ref 5–15)
BUN: 7 mg/dL (ref 6–20)
CHLORIDE: 97 mmol/L — AB (ref 101–111)
CO2: 25 mmol/L (ref 22–32)
CREATININE: 0.61 mg/dL (ref 0.61–1.24)
Calcium: 8.6 mg/dL — ABNORMAL LOW (ref 8.9–10.3)
GFR calc Af Amer: >60 mL/min (ref >60)
Glucose, Bld: 255 mg/dL — ABNORMAL HIGH (ref 65–99)
Potassium: 4 mmol/L (ref 3.5–5.1)
SODIUM: 131 mmol/L — AB (ref 135–145)

## 2017-10-10 LAB — COMPREHENSIVE METABOLIC PANEL
ALK PHOS: 71 U/L (ref 38–126)
ALT: 50 U/L (ref 17–63)
ANION GAP: 10 (ref 5–15)
AST: 38 U/L (ref 15–41)
Albumin: 2.9 g/dL — ABNORMAL LOW (ref 3.5–5.0)
BILIRUBIN TOTAL: 0.4 mg/dL (ref 0.3–1.2)
BUN: 11 mg/dL (ref 6–20)
CALCIUM: 9.1 mg/dL (ref 8.9–10.3)
CO2: 26 mmol/L (ref 22–32)
Chloride: 97 mmol/L — ABNORMAL LOW (ref 101–111)
Creatinine, Ser: 0.64 mg/dL (ref 0.61–1.24)
Glucose, Bld: 412 mg/dL — ABNORMAL HIGH (ref 65–99)
Potassium: 4.8 mmol/L (ref 3.5–5.1)
SODIUM: 133 mmol/L — AB (ref 135–145)
TOTAL PROTEIN: 7.6 g/dL (ref 6.5–8.1)

## 2017-10-10 LAB — TROPONIN I: Troponin I: <0.03 ng/mL (ref <0.03)

## 2017-10-10 LAB — BRAIN NATRIURETIC PEPTIDE: B NATRIURETIC PEPTIDE 5: 35 pg/mL (ref 0.0–100.0)

## 2017-10-10 LAB — LACTIC ACID, PLASMA
Lactic Acid, Venous: 0.9 mmol/L (ref 0.5–1.9)
Lactic Acid, Venous: 1.1 mmol/L (ref 0.5–1.9)

## 2017-10-10 LAB — I-STAT CG4 LACTIC ACID, ED: Lactic Acid, Venous: 2.34 mmol/L (ref 0.5–1.9)

## 2017-10-10 MED ORDER — MORPHINE SULFATE 15 MG PO TABS
15.0000 mg | ORAL_TABLET | Freq: Once | ORAL | Status: AC | PRN
  Administered 2017-10-10: 15 mg via ORAL
  Filled 2017-10-10: qty 1

## 2017-10-10 MED ORDER — METHYLPREDNISOLONE SODIUM SUCC 125 MG IJ SOLR
125.0000 mg | Freq: Once | INTRAMUSCULAR | Status: AC
  Administered 2017-10-10: 125 mg via INTRAVENOUS
  Filled 2017-10-10: qty 2

## 2017-10-10 MED ORDER — IPRATROPIUM BROMIDE 0.02 % IN SOLN
1.0000 mg | Freq: Once | RESPIRATORY_TRACT | Status: AC
  Administered 2017-10-10: 1 mg via RESPIRATORY_TRACT
  Filled 2017-10-10: qty 5

## 2017-10-10 MED ORDER — ALBUTEROL (5 MG/ML) CONTINUOUS INHALATION SOLN
10.0000 mg/h | INHALATION_SOLUTION | Freq: Once | RESPIRATORY_TRACT | Status: AC
  Administered 2017-10-10: 10 mg/h via RESPIRATORY_TRACT
  Filled 2017-10-10: qty 20

## 2017-10-10 MED ORDER — PREDNISONE 20 MG PO TABS: 40.0000 mg | ORAL_TABLET | Freq: Every day | ORAL | 0 refills | Status: DC

## 2017-10-10 MED ORDER — DOXYCYCLINE HYCLATE 100 MG PO TABS: 100.0000 mg | ORAL_TABLET | Freq: Two times a day (BID) | ORAL | 0 refills | Status: DC

## 2017-10-10 NOTE — ED Triage Notes (Signed)
Pt reports woke up sob and productive cough.  Pt on home 02 at 3 liters.  Pt has copd.

## 2017-10-10 NOTE — ED Notes (Signed)
Pt still in waiting room,   istat lactic results shown to triage nurse Janett Billow.

## 2017-10-10 NOTE — ED Notes (Signed)
Pt ambulated with 3 Liters of oxygen maintaining 95-96%.

## 2017-10-10 NOTE — ED Triage Notes (Signed)
Pt states that for the past week his R lower leg has been red and swollen, some fevers.
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
## 2017-10-10 NOTE — Discharge Instructions (Signed)
Take the prescriptions as directed.  Use your albuterol inhaler (2 to 4 puffs) or your albuterol nebulizer (1 unit dose) every 4 hours for the next 7 days, then as needed for cough, wheezing, or shortness of breath.  Call your regular medical doctor today to schedule a follow up appointment within the next 3 days.  Return to the Emergency Department immediately sooner if worsening.

## 2017-10-11 ENCOUNTER — Encounter (HOSPITAL_COMMUNITY): Payer: Self-pay | Admitting: Emergency Medicine

## 2017-10-11 ENCOUNTER — Emergency Department (HOSPITAL_COMMUNITY)
Admission: EM | Admit: 2017-10-11 | Discharge: 2017-10-11 | Disposition: A | Discharge status: Home / Self Care | Payer: Medicare Other | Source: Home / Self Care | Attending: Emergency Medicine | Admitting: Emergency Medicine

## 2017-10-11 ENCOUNTER — Emergency Department (HOSPITAL_COMMUNITY): Payer: Medicare Other

## 2017-10-11 ENCOUNTER — Inpatient Hospital Stay (HOSPITAL_COMMUNITY)
Admission: EM | Admit: 2017-10-11 | Discharge: 2017-10-13 | DRG: 872 | Discharge status: Against Medical Advice | Payer: Medicare Other | Attending: Internal Medicine | Admitting: Internal Medicine

## 2017-10-11 DIAGNOSIS — Z820 Family history of epilepsy and other diseases of the nervous system: Secondary | ICD-10-CM

## 2017-10-11 DIAGNOSIS — I509 Heart failure, unspecified: Secondary | ICD-10-CM | POA: Diagnosis present

## 2017-10-11 DIAGNOSIS — A419 Sepsis, unspecified organism: Principal | ICD-10-CM | POA: Diagnosis present

## 2017-10-11 DIAGNOSIS — Z811 Family history of alcohol abuse and dependence: Secondary | ICD-10-CM

## 2017-10-11 DIAGNOSIS — Z8261 Family history of arthritis: Secondary | ICD-10-CM | POA: Diagnosis not present

## 2017-10-11 DIAGNOSIS — Z66 Do not resuscitate: Secondary | ICD-10-CM | POA: Diagnosis present

## 2017-10-11 DIAGNOSIS — N400 Enlarged prostate without lower urinary tract symptoms: Secondary | ICD-10-CM | POA: Diagnosis present

## 2017-10-11 DIAGNOSIS — I69398 Other sequelae of cerebral infarction: Secondary | ICD-10-CM

## 2017-10-11 DIAGNOSIS — Z825 Family history of asthma and other chronic lower respiratory diseases: Secondary | ICD-10-CM | POA: Diagnosis not present

## 2017-10-11 DIAGNOSIS — R739 Hyperglycemia, unspecified: Secondary | ICD-10-CM

## 2017-10-11 DIAGNOSIS — Z5321 Procedure and treatment not carried out due to patient leaving prior to being seen by health care provider: Secondary | ICD-10-CM | POA: Diagnosis not present

## 2017-10-11 DIAGNOSIS — Z794 Long term (current) use of insulin: Secondary | ICD-10-CM

## 2017-10-11 DIAGNOSIS — I252 Old myocardial infarction: Secondary | ICD-10-CM

## 2017-10-11 DIAGNOSIS — Z9981 Dependence on supplemental oxygen: Secondary | ICD-10-CM | POA: Diagnosis not present

## 2017-10-11 DIAGNOSIS — E1165 Type 2 diabetes mellitus with hyperglycemia: Secondary | ICD-10-CM | POA: Diagnosis present

## 2017-10-11 DIAGNOSIS — J441 Chronic obstructive pulmonary disease with (acute) exacerbation: Secondary | ICD-10-CM | POA: Diagnosis present

## 2017-10-11 DIAGNOSIS — N318 Other neuromuscular dysfunction of bladder: Secondary | ICD-10-CM | POA: Diagnosis present

## 2017-10-11 DIAGNOSIS — E785 Hyperlipidemia, unspecified: Secondary | ICD-10-CM | POA: Diagnosis present

## 2017-10-11 DIAGNOSIS — K219 Gastro-esophageal reflux disease without esophagitis: Secondary | ICD-10-CM | POA: Diagnosis present

## 2017-10-11 DIAGNOSIS — J449 Chronic obstructive pulmonary disease, unspecified: Secondary | ICD-10-CM | POA: Diagnosis not present

## 2017-10-11 DIAGNOSIS — Z86711 Personal history of pulmonary embolism: Secondary | ICD-10-CM

## 2017-10-11 DIAGNOSIS — Z818 Family history of other mental and behavioral disorders: Secondary | ICD-10-CM

## 2017-10-11 DIAGNOSIS — K769 Liver disease, unspecified: Secondary | ICD-10-CM | POA: Diagnosis present

## 2017-10-11 DIAGNOSIS — M170 Bilateral primary osteoarthritis of knee: Secondary | ICD-10-CM | POA: Diagnosis present

## 2017-10-11 DIAGNOSIS — Z7901 Long term (current) use of anticoagulants: Secondary | ICD-10-CM

## 2017-10-11 DIAGNOSIS — Z8249 Family history of ischemic heart disease and other diseases of the circulatory system: Secondary | ICD-10-CM

## 2017-10-11 DIAGNOSIS — Z79891 Long term (current) use of opiate analgesic: Secondary | ICD-10-CM

## 2017-10-11 DIAGNOSIS — E119 Type 2 diabetes mellitus without complications: Secondary | ICD-10-CM | POA: Diagnosis not present

## 2017-10-11 DIAGNOSIS — E872 Acidosis: Secondary | ICD-10-CM | POA: Diagnosis present

## 2017-10-11 DIAGNOSIS — M79662 Pain in left lower leg: Secondary | ICD-10-CM | POA: Diagnosis present

## 2017-10-11 DIAGNOSIS — F17210 Nicotine dependence, cigarettes, uncomplicated: Secondary | ICD-10-CM | POA: Diagnosis present

## 2017-10-11 DIAGNOSIS — R0602 Shortness of breath: Secondary | ICD-10-CM | POA: Diagnosis present

## 2017-10-11 DIAGNOSIS — M479 Spondylosis, unspecified: Secondary | ICD-10-CM | POA: Diagnosis present

## 2017-10-11 DIAGNOSIS — L03116 Cellulitis of left lower limb: Secondary | ICD-10-CM | POA: Diagnosis present

## 2017-10-11 DIAGNOSIS — I110 Hypertensive heart disease with heart failure: Secondary | ICD-10-CM | POA: Diagnosis present

## 2017-10-11 DIAGNOSIS — G8929 Other chronic pain: Secondary | ICD-10-CM | POA: Diagnosis not present

## 2017-10-11 DIAGNOSIS — J9610 Chronic respiratory failure, unspecified whether with hypoxia or hypercapnia: Secondary | ICD-10-CM | POA: Diagnosis present

## 2017-10-11 DIAGNOSIS — Z7952 Long term (current) use of systemic steroids: Secondary | ICD-10-CM

## 2017-10-11 DIAGNOSIS — IMO0001 Reserved for inherently not codable concepts without codable children: Secondary | ICD-10-CM

## 2017-10-11 DIAGNOSIS — Z8349 Family history of other endocrine, nutritional and metabolic diseases: Secondary | ICD-10-CM

## 2017-10-11 LAB — URINALYSIS, ROUTINE W REFLEX MICROSCOPIC
BILIRUBIN URINE: NEGATIVE
HGB URINE DIPSTICK: NEGATIVE
Ketones, ur: NEGATIVE mg/dL
Leukocytes, UA: NEGATIVE
NITRITE: NEGATIVE
PROTEIN: 30 mg/dL — AB
Specific Gravity, Urine: 1.032 — ABNORMAL HIGH (ref 1.005–1.030)
Squamous Epithelial / LPF: NONE SEEN
pH: 7 (ref 5.0–8.0)

## 2017-10-11 LAB — CBC WITH DIFFERENTIAL/PLATELET
BASOS ABS: 0 10*3/uL (ref 0.0–0.1)
Basophils Absolute: 0 10*3/uL (ref 0.0–0.1)
Basophils Relative: 0 %
Basophils Relative: 0 %
EOS ABS: 0 10*3/uL (ref 0.0–0.7)
EOS ABS: 0 10*3/uL (ref 0.0–0.7)
EOS PCT: 0 %
Eosinophils Relative: 0 %
HCT: 44.8 % (ref 39.0–52.0)
HCT: 44.2 % (ref 39.0–52.0)
HEMOGLOBIN: 15.1 g/dL (ref 13.0–17.0)
Hemoglobin: 14.4 g/dL (ref 13.0–17.0)
LYMPHS ABS: 1.5 10*3/uL (ref 0.7–4.0)
LYMPHS PCT: 10 %
Lymphocytes Relative: 9 %
Lymphs Abs: 1.3 10*3/uL (ref 0.7–4.0)
MCH: 28.5 pg (ref 26.0–34.0)
MCH: 27.5 pg (ref 26.0–34.0)
MCHC: 33.7 g/dL (ref 30.0–36.0)
MCHC: 32.6 g/dL (ref 30.0–36.0)
MCV: 84.5 fL (ref 78.0–100.0)
MCV: 84.5 fL (ref 78.0–100.0)
MONO ABS: 0.9 10*3/uL (ref 0.1–1.0)
Monocytes Absolute: 0.9 10*3/uL (ref 0.1–1.0)
Monocytes Relative: 7 %
Monocytes Relative: 6 %
NEUTROS PCT: 83 %
Neutro Abs: 11.2 10*3/uL — ABNORMAL HIGH (ref 1.7–7.7)
Neutro Abs: 13.5 10*3/uL — ABNORMAL HIGH (ref 1.7–7.7)
Neutrophils Relative %: 85 %
PLATELETS: 312 10*3/uL (ref 150–400)
Platelets: 317 10*3/uL (ref 150–400)
RBC: 5.3 MIL/uL (ref 4.22–5.81)
RBC: 5.23 MIL/uL (ref 4.22–5.81)
RDW: 13.7 % (ref 11.5–15.5)
RDW: 13.5 % (ref 11.5–15.5)
WBC: 13.4 10*3/uL — AB (ref 4.0–10.5)
WBC: 15.9 10*3/uL — AB (ref 4.0–10.5)

## 2017-10-11 LAB — INFLUENZA PANEL BY PCR (TYPE A & B)
INFLAPCR: NEGATIVE
INFLBPCR: NEGATIVE

## 2017-10-11 LAB — COMPREHENSIVE METABOLIC PANEL
ALT: 45 U/L (ref 17–63)
AST: 37 U/L (ref 15–41)
Albumin: 3.1 g/dL — ABNORMAL LOW (ref 3.5–5.0)
Alkaline Phosphatase: 69 U/L (ref 38–126)
Anion gap: 10 (ref 5–15)
BUN: 13 mg/dL (ref 6–20)
CHLORIDE: 100 mmol/L — AB (ref 101–111)
CO2: 25 mmol/L (ref 22–32)
Calcium: 9.3 mg/dL (ref 8.9–10.3)
Creatinine, Ser: 0.75 mg/dL (ref 0.61–1.24)
GFR calc non Af Amer: >60 mL/min (ref >60)
Glucose, Bld: 463 mg/dL — ABNORMAL HIGH (ref 65–99)
POTASSIUM: 5 mmol/L (ref 3.5–5.1)
Sodium: 135 mmol/L (ref 135–145)
Total Bilirubin: 0.3 mg/dL (ref 0.3–1.2)
Total Protein: 7.5 g/dL (ref 6.5–8.1)

## 2017-10-11 LAB — BLOOD GAS, ARTERIAL
ACID-BASE EXCESS: 1.6 mmol/L (ref 0.0–2.0)
BICARBONATE: 26.1 mmol/L (ref 20.0–28.0)
Drawn by: 28459
FIO2: 32
O2 Saturation: 98.6 %
PH ART: 7.445 (ref 7.350–7.450)
PO2 ART: 126 mmHg — AB (ref 83.0–108.0)
Patient temperature: 37
pCO2 arterial: 37.4 mmHg (ref 32.0–48.0)

## 2017-10-11 LAB — I-STAT CG4 LACTIC ACID, ED
LACTIC ACID, VENOUS: 2.76 mmol/L — AB (ref 0.5–1.9)
LACTIC ACID, VENOUS: 3.45 mmol/L — AB (ref 0.5–1.9)

## 2017-10-11 LAB — GLUCOSE, CAPILLARY: Glucose-Capillary: 343 mg/dL — ABNORMAL HIGH (ref 65–99)

## 2017-10-11 LAB — CBG MONITORING, ED: Glucose-Capillary: 273 mg/dL — ABNORMAL HIGH (ref 65–99)

## 2017-10-11 MED ORDER — MELATONIN 3 MG PO TABS
3.0000 mg | ORAL_TABLET | Freq: Every day | ORAL | Status: DC
  Filled 2017-10-11 (×2): qty 1

## 2017-10-11 MED ORDER — PREDNISONE 20 MG PO TABS
40.0000 mg | ORAL_TABLET | Freq: Every day | ORAL | Status: DC
Start: 2017-10-12 — End: 2017-10-13
  Administered 2017-10-12: 40 mg via ORAL
  Filled 2017-10-11: qty 2

## 2017-10-11 MED ORDER — VANCOMYCIN HCL 10 G IV SOLR
1500.0000 mg | Freq: Two times a day (BID) | INTRAVENOUS | Status: DC
  Administered 2017-10-12 (×2): 1500 mg via INTRAVENOUS
  Filled 2017-10-11 (×3): qty 1500

## 2017-10-11 MED ORDER — IBUPROFEN 800 MG PO TABS: 800.0000 mg | ORAL_TABLET | Freq: Three times a day (TID) | ORAL | Status: DC | PRN

## 2017-10-11 MED ORDER — MORPHINE SULFATE (PF) 4 MG/ML IV SOLN
4.0000 mg | Freq: Once | INTRAVENOUS | Status: AC
  Administered 2017-10-11: 4 mg via INTRAVENOUS
  Filled 2017-10-11: qty 1

## 2017-10-11 MED ORDER — ONDANSETRON HCL 4 MG/2ML IJ SOLN: 4.0000 mg | Freq: Four times a day (QID) | INTRAMUSCULAR | Status: DC | PRN

## 2017-10-11 MED ORDER — VANCOMYCIN HCL IN DEXTROSE 1-5 GM/200ML-% IV SOLN
1000.0000 mg | INTRAVENOUS | Status: AC
  Administered 2017-10-11 (×2): 1000 mg via INTRAVENOUS
  Filled 2017-10-11 (×2): qty 200

## 2017-10-11 MED ORDER — SODIUM CHLORIDE 0.9 % IV SOLN
1000.0000 mL | INTRAVENOUS | Status: DC
  Administered 2017-10-11 – 2017-10-12 (×5): 1000 mL via INTRAVENOUS

## 2017-10-11 MED ORDER — VANCOMYCIN HCL IN DEXTROSE 1-5 GM/200ML-% IV SOLN: 1000.0000 mg | Freq: Once | INTRAVENOUS | Status: DC

## 2017-10-11 MED ORDER — LORAZEPAM 0.5 MG PO TABS
0.5000 mg | ORAL_TABLET | Freq: Three times a day (TID) | ORAL | Status: DC | PRN
  Administered 2017-10-11: 0.5 mg via ORAL
  Filled 2017-10-11: qty 1

## 2017-10-11 MED ORDER — SODIUM CHLORIDE 0.9 % IV BOLUS (SEPSIS)
1000.0000 mL | Freq: Once | INTRAVENOUS | Status: AC
  Administered 2017-10-11: 1000 mL via INTRAVENOUS

## 2017-10-11 MED ORDER — TAMSULOSIN HCL 0.4 MG PO CAPS
0.4000 mg | ORAL_CAPSULE | Freq: Every day | ORAL | Status: DC
  Administered 2017-10-11 – 2017-10-12 (×2): 0.4 mg via ORAL
  Filled 2017-10-11 (×2): qty 1

## 2017-10-11 MED ORDER — INSULIN ASPART 100 UNIT/ML ~~LOC~~ SOLN
0.0000 [IU] | SUBCUTANEOUS | Status: DC
  Administered 2017-10-11: 15 [IU] via SUBCUTANEOUS
  Administered 2017-10-12: 20 [IU] via SUBCUTANEOUS
  Administered 2017-10-12: 3 [IU] via SUBCUTANEOUS
  Administered 2017-10-12: 20 [IU] via SUBCUTANEOUS
  Administered 2017-10-12: 15 [IU] via SUBCUTANEOUS
  Administered 2017-10-12: 20 [IU] via SUBCUTANEOUS
  Administered 2017-10-12: 4 [IU] via SUBCUTANEOUS
  Administered 2017-10-13: 15 [IU] via SUBCUTANEOUS

## 2017-10-11 MED ORDER — NITROGLYCERIN 0.4 MG SL SUBL: 0.4000 mg | SUBLINGUAL_TABLET | Freq: Every day | SUBLINGUAL | Status: DC | PRN

## 2017-10-11 MED ORDER — MORPHINE SULFATE 15 MG PO TABS
15.0000 mg | ORAL_TABLET | Freq: Three times a day (TID) | ORAL | Status: DC
  Administered 2017-10-12 (×4): 15 mg via ORAL
  Filled 2017-10-11 (×4): qty 1

## 2017-10-11 MED ORDER — ONDANSETRON HCL 4 MG PO TABS: 4.0000 mg | ORAL_TABLET | Freq: Four times a day (QID) | ORAL | Status: DC | PRN

## 2017-10-11 MED ORDER — OXYCODONE-ACETAMINOPHEN 5-325 MG PO TABS
1.0000 | ORAL_TABLET | Freq: Once | ORAL | Status: AC
  Administered 2017-10-11: 1 via ORAL
  Filled 2017-10-11: qty 1

## 2017-10-11 MED ORDER — APIXABAN 5 MG PO TABS
5.0000 mg | ORAL_TABLET | Freq: Two times a day (BID) | ORAL | Status: DC
  Administered 2017-10-11 – 2017-10-12 (×3): 5 mg via ORAL
  Filled 2017-10-11 (×3): qty 1

## 2017-10-11 MED ORDER — IPRATROPIUM-ALBUTEROL 0.5-2.5 (3) MG/3ML IN SOLN: 3.0000 mL | RESPIRATORY_TRACT | Status: DC | PRN

## 2017-10-11 MED ORDER — GLUCERNA SHAKE PO LIQD
237.0000 mL | Freq: Every day | ORAL | Status: DC
  Administered 2017-10-11 – 2017-10-12 (×5): 237 mL via ORAL
  Filled 2017-10-11 (×9): qty 237

## 2017-10-11 MED ORDER — PIPERACILLIN-TAZOBACTAM 3.375 G IVPB
3.3750 g | Freq: Three times a day (TID) | INTRAVENOUS | Status: DC
  Administered 2017-10-12 (×3): 3.375 g via INTRAVENOUS
  Filled 2017-10-11 (×2): qty 50

## 2017-10-11 MED ORDER — DOCUSATE SODIUM 100 MG PO CAPS: 100.0000 mg | ORAL_CAPSULE | Freq: Two times a day (BID) | ORAL | Status: DC | PRN

## 2017-10-11 MED ORDER — MORPHINE SULFATE (CONCENTRATE) 10 MG/0.5ML PO SOLN
20.0000 mg | Freq: Four times a day (QID) | ORAL | Status: DC | PRN
  Administered 2017-10-12 – 2017-10-13 (×3): 20 mg via ORAL
  Filled 2017-10-11 (×4): qty 1

## 2017-10-11 MED ORDER — ACETAMINOPHEN 325 MG PO TABS
650.0000 mg | ORAL_TABLET | Freq: Four times a day (QID) | ORAL | Status: DC | PRN
  Administered 2017-10-11: 650 mg via ORAL
  Filled 2017-10-11: qty 2

## 2017-10-11 MED ORDER — PIPERACILLIN-TAZOBACTAM 3.375 G IVPB 30 MIN
3.3750 g | Freq: Once | INTRAVENOUS | Status: AC
  Administered 2017-10-11: 3.375 g via INTRAVENOUS
  Filled 2017-10-11: qty 50

## 2017-10-11 MED ORDER — ACETAMINOPHEN 650 MG RE SUPP: 650.0000 mg | Freq: Four times a day (QID) | RECTAL | Status: DC | PRN

## 2017-10-11 MED ORDER — IPRATROPIUM-ALBUTEROL 0.5-2.5 (3) MG/3ML IN SOLN
3.0000 mL | Freq: Four times a day (QID) | RESPIRATORY_TRACT | Status: DC
  Administered 2017-10-11 – 2017-10-12 (×5): 3 mL via RESPIRATORY_TRACT
  Filled 2017-10-11 (×4): qty 3

## 2017-10-11 MED ORDER — MORPHINE SULFATE ER 30 MG PO TBCR
60.0000 mg | EXTENDED_RELEASE_TABLET | Freq: Two times a day (BID) | ORAL | Status: DC
  Administered 2017-10-11 – 2017-10-12 (×3): 60 mg via ORAL
  Filled 2017-10-11 (×3): qty 2

## 2017-10-11 MED ORDER — IPRATROPIUM-ALBUTEROL 0.5-2.5 (3) MG/3ML IN SOLN
3.0000 mL | RESPIRATORY_TRACT | Status: AC
  Filled 2017-10-11: qty 3

## 2017-10-11 MED ORDER — VANCOMYCIN HCL IN DEXTROSE 1-5 GM/200ML-% IV SOLN
1000.0000 mg | Freq: Once | INTRAVENOUS | Status: AC
  Administered 2017-10-11: 1000 mg via INTRAVENOUS
  Filled 2017-10-11: qty 200

## 2017-10-11 NOTE — ED Provider Notes (Signed)
[facility] EMERGENCY DEPARTMENT Provider Note   CSN: [id number] Arrival date & time: 10/10/17  2225     History   Chief Complaint Chief Complaint  Patient presents with  . Cellulitus    HPI Roger Flores is a 56 y.o. male.  HPI  This is a 56 year old male with a history of end-stage lung disease, CHF, COPD, hypertension, hyperlipidemia who presents with left lower extremity redness.  Patient reports 1 week history of worsening left lower extremity redness and pain.  He is currently on hospice for end-stage lung disease.  He was seen by hospice nurse told he needed antibiotics for cellulitis.  Reports fever 2 days ago of 101.  Otherwise denies any worsening shortness of breath, nausea vomiting, abdominal pain, chest pain.  Past Medical History:  Diagnosis Date  . Anxiety   . Arthritis    knees and back  . Asthma   . Cancer ([place])    Larynx  . Cataract   . CHF (congestive heart failure) ([place])   . COPD (chronic obstructive pulmonary disease) ([place])   . Depression   . Diabetes mellitus without complication ([place])   . Emphysema of lung ([place])   . GERD (gastroesophageal reflux disease)   . Hyperlipidemia   . Hypertension   . Kidney disease   . Liver disease   . Lung mass   . Myocardial infarction ([place])   . Opioid withdrawal ([place])   . Oxygen deficiency   . Stroke ([place])   . Tobacco use     Patient Active Problem List   Diagnosis Date Noted  . Chronic pain 07/24/2017  . Stroke ([place]) 07/21/2017  . History of cancer of larynx 07/21/2017  . Self-catheterizes urinary bladder 07/21/2017  . 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 DNR no code (do not resuscitate) 07/21/2017  . Insulin dependent diabetes mellitus ([place]) 07/21/2017  . Pressure injury of skin 07/19/2017  . Chest pain 07/18/2017  . COPD, very severe ([place]) 07/18/2017  . Unstable angina pectoris ([place]) 07/01/2017    Past Surgical History:  Procedure Laterality Date  . NECK  SURGERY    . THROAT SURGERY         Home Medications    Prior to Admission medications   Medication Sig Start Date End Date Taking? Authorizing Provider  albuterol (PROVENTIL) (2.5 MG/3ML) 0.083% nebulizer solution Take 3 mLs (2.5 mg total) by nebulization every 6 (six) hours as needed for wheezing or shortness of breath. 07/20/17  Yes Kathie Dike, MD  apixaban (ELIQUIS) 5 MG TABS tablet Take 1 tablet (5 mg total) by mouth 2 (two) times daily. 07/04/17  Yes Jola Schmidt, MD  docusate sodium (COLACE) 100 MG capsule Take 100 mg by mouth 2 (two) times daily as needed for constipation.   Yes [provider]  feeding supplement, GLUCERNA SHAKE, (GLUCERNA SHAKE) LIQD Take 237 mLs 5 (five) times daily by mouth.    Yes [provider]  furosemide (LASIX) 40 MG tablet Take 60 mg daily by mouth.    Yes [provider]  ibuprofen (ADVIL,MOTRIN) 800 MG tablet Take 800 mg by mouth every 8 (eight) hours as needed for moderate pain.   Yes [provider]  insulin NPH Human (HUMULIN N,NOVOLIN N) 100 UNIT/ML injection Inject 8 Units 3 (three) times daily before meals into the skin.   Yes [provider]  insulin regular (NOVOLIN R,HUMULIN R) 100 units/mL injection Inject 18 Units at bedtime into the skin.  Yes [provider]  LORazepam (ATIVAN) 0.5 MG tablet Take 1 tablet (0.5 mg total) by mouth every 8 (eight) hours as needed for anxiety. 07/22/17  Yes Duffy Bruce, MD  Melatonin 3 MG TABS Take 3 mg at bedtime by mouth.   Yes [provider]  morphine (MS CONTIN) 60 MG 12 hr tablet Take 60 mg every 12 (twelve) hours by mouth.   Yes [provider]  morphine (MSIR) 15 MG tablet Take 15 mg 3 (three) times daily by mouth.    Yes [provider]  morphine (ROXANOL) 20 MG/ML concentrated solution Take 20 mg every 6 (six) hours as needed by mouth for breakthrough pain.   Yes [provider]  nitroGLYCERIN (NITROSTAT)  0.4 MG SL tablet Place 1 tablet under the tongue daily as needed for chest pain. 05/23/17  Yes [provider]  ondansetron (ZOFRAN) 4 MG tablet Take 1 tablet (4 mg total) by mouth every 8 (eight) hours as needed for nausea or vomiting. 08/29/17  Yes Mesner, Corene Cornea, MD  prochlorperazine (COMPAZINE) 5 MG tablet Take 5 mg by mouth every 6 (six) hours as needed for nausea or vomiting.   Yes [provider]  tamsulosin (FLOMAX) 0.4 MG CAPS capsule Take 0.4 mg daily after breakfast by mouth.    Yes [provider]  doxycycline (VIBRA-TABS) 100 MG tablet Take 1 tablet (100 mg total) 2 (two) times daily by mouth. 10/10/17   Francine Graven, DO  OXYGEN Inhale 3 L continuous into the lungs. At home     [provider]  predniSONE (DELTASONE) 20 MG tablet Take 2 tablets (40 mg total) daily by mouth. 10/10/17   Francine Graven, DO    Family History Family History  Problem Relation Age of Onset  . Arthritis Mother   . Asthma Mother   . COPD Mother   . Depression Mother   . Hyperlipidemia Mother   . Hypertension Mother   . Miscarriages / [place] Mother   . Alzheimer's disease Mother   . Alcohol abuse Father   . Drug abuse Father   . Early death Father 51       gunshot  . Heart disease Brother 57  . Early death Son        SIDS  . Early death Son        MVA    Social History Social History   Tobacco Use  . Smoking status: Current Every Day Smoker    Packs/day: 3.00    Types: Cigarettes    Start date: 11/25/1977  . Smokeless tobacco: Never Used  Substance Use Topics  . Alcohol use: No  . Drug use: No     Allergies   Patient has no known allergies.   Review of Systems Review of Systems  Constitutional: Positive for fever.  Respiratory: Positive for shortness of breath. Negative for cough.   Cardiovascular: Negative for chest pain.  Gastrointestinal: Negative for abdominal pain, nausea and vomiting.  Genitourinary: Negative for dysuria.    Skin: Positive for color change.  All other systems reviewed and are negative.    Physical Exam Updated Vital Signs BP 136/89   Pulse 92   Temp (!) 97.4 F (36.3 C) (Oral)   Resp 16   Ht 5\' 6"  (1.676 m)   Wt 90.7 kg (200 lb)   SpO2 99%   BMI 32.28 kg/m   Physical Exam  Constitutional: He is oriented to person, place, and time. No distress.  Chronically ill-appearing,  no acute distress  HENT:  Head: Normocephalic and atraumatic.  Cardiovascular: Normal rate, regular rhythm and normal heart sounds.  No murmur heard. Pulmonary/Chest: Effort normal. No respiratory distress. He has wheezes.  Coarse breath sounds in all lung fields  Abdominal: Soft. There is no tenderness.  Musculoskeletal: He exhibits edema.  1+ bilateral lower extremity edema, there is erythema over the medial aspect of the left lower extremity with associated warmth, blanching, 2+ DP pulse, no fluctuance  Neurological: He is alert and oriented to person, place, and time.  Skin: Skin is warm and dry.  Psychiatric: He has a normal mood and affect.  Nursing note and vitals reviewed.    ED Treatments / Results  Labs (all labs ordered are listed, but only abnormal results are displayed) Labs Reviewed  COMPREHENSIVE METABOLIC PANEL - Abnormal; Notable for the following components:      Result Value   Sodium 133 (*)    Chloride 97 (*)    Glucose, Bld 412 (*)    Albumin 2.9 (*)    All other components within normal limits  CBC WITH DIFFERENTIAL/PLATELET - Abnormal; Notable for the following components:   WBC 13.4 (*)    Neutro Abs 11.2 (*)    All other components within normal limits  URINALYSIS, ROUTINE W REFLEX MICROSCOPIC - Abnormal; Notable for the following components:   Color, Urine STRAW (*)    Specific Gravity, Urine 1.037 (*)    Glucose, UA >=500 (*)    Protein, ur 30 (*)    All other components within normal limits  I-STAT CG4 LACTIC ACID, ED - Abnormal; Notable for the following components:    Lactic Acid, Venous 2.34 (*)    All other components within normal limits  I-STAT CG4 LACTIC ACID, ED - Abnormal; Notable for the following components:   Lactic Acid, Venous 2.76 (*)    All other components within normal limits  CBG MONITORING, ED - Abnormal; Notable for the following components:   Glucose-Capillary 273 (*)    All other components within normal limits    EKG  EKG Interpretation None       Radiology Dg Chest Portable 1 View  Result Date: 10/10/2017 CLINICAL DATA:  Shortness of breath, chest pain EXAM: PORTABLE CHEST 1 VIEW COMPARISON:  09/28/2017 FINDINGS: Heart and mediastinal contours are within normal limits. No focal opacities or effusions. No acute bony abnormality. IMPRESSION: No active disease. Electronically Signed   By: Rolm Baptise M.D.   On: 10/10/2017 09:25    Procedures Procedures (including critical care time)  Medications Ordered in ED Medications  vancomycin (VANCOCIN) IVPB 1000 mg/200 mL premix (0 mg Intravenous Stopped 10/11/17 0603)  sodium chloride 0.9 % bolus 1,000 mL (0 mLs Intravenous Stopped 10/11/17 0658)  oxyCODONE-acetaminophen (PERCOCET/ROXICET) 5-325 MG per tablet 1 tablet (1 tablet Oral Given 10/11/17 0324)     Initial Impression / Assessment and Plan / ED Course  I have reviewed the triage vital signs and the nursing notes.  Pertinent labs & imaging results that were available during my care of the patient were reviewed by me and considered in my medical decision making (see chart for details).     Patient presents with redness of the left leg.  Also reports intermittent fevers.  Was seen and evaluated earlier today for shortness of breath.  He has not started antibiotics.  He was discharged on doxycycline.  He has obvious cellulitis of the lower extremity.  He has a leukocytosis.  Otherwise his vital  signs are largely reassuring.  Patient was given 1 dose of vancomycin.  Lab work does show hyperglycemia without an anion gap.   He was also given a liter of fluids.  Replete blood sugars improving.  Discussed with patient start on doxycycline which would also cover for cellulitis.  If he has any new or worsening redness or persistent fevers he needs to be reevaluated.  After history, exam, and medical workup I feel the patient has been appropriately medically screened and is safe for discharge home. Pertinent diagnoses were discussed with the patient. Patient was given return precautions.   Final Clinical Impressions(s) / ED Diagnoses   Final diagnoses:  Cellulitis of left lower extremity  Hyperglycemia    ED Discharge Orders    None       Merryl Hacker, MD 10/11/17 [phone]
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
## 2017-10-11 NOTE — ED Triage Notes (Signed)
Pt reports productive cough with intermittent fever x 1 week with shortness of breath x 2 hours.

## 2017-10-11 NOTE — H&P (Signed)
History and Physical    Roger Flores QIW:[id number] DOB: 1961-10-17 DOA: 10/11/2017  Referring MD/NP/PA: Dr. Noemi Chapel PCP: Jani Gravel, MD  Patient coming from: Home  Chief Complaint: Could not breathe and left leg pain  HPI: Roger Flores is a 56 y.o. male with medical history significant of HTN, HLD, CHF, end-stage COPD, H/O IV drug abuse, and on community hospice; who presents with complaints of difficulty breathing and left leg pain.  Patient reports that approximately 2 weeks ago he had left leg swelling and hospice had placed a [place] boot on the leg.  When they took it off about a week ago he noticed redness of the right lower extremity.  Since that time he noted increasing redness of the left lower extremity and sharp pain.  Associated symptoms include chronic chest pain unchanged, wheezing, generalized malaise, nausea, and fever up to 101F 2 days ago.  He was seen earlier today in the emergency department and discharged home with prescription for antibiotics, but came back for his complaints of shortness of breath   ED Course: Upon admission into the emergency department patient was noted to be afebrile, pulse 93-111, respirations 24-32, and all other vital signs maintained.  Labs revealed WBC 15.9, glucose 463, anion gap 10, lactic acid 3.45.  Patient was started on sepsis protocol without fluid bolus given CHF history with empiric antibiotics of vancomycin and Zosyn for presumed cellulitis of the left leg.  TRH called to admit.  Review of Systems  Constitutional: Positive for fever and malaise/fatigue.  HENT: Negative for ear discharge and nosebleeds.   Eyes: Negative for photophobia and pain.  Respiratory: Positive for shortness of breath and wheezing.   Cardiovascular: Positive for chest pain (chronic) and leg swelling.  Gastrointestinal: Positive for nausea. Negative for abdominal pain, diarrhea and vomiting.  Genitourinary: Negative for dysuria and frequency.    Musculoskeletal: Negative for myalgias and neck pain.  Skin:       Positive for erythema of the skin  Neurological: Negative for speech change and focal weakness.  Psychiatric/Behavioral: Negative for suicidal ideas. The patient is nervous/anxious.     Past Medical History:  Diagnosis Date  . Anxiety   . Arthritis    knees and back  . Asthma   . 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 Cataract   . CHF (congestive heart failure) ([place])   . COPD (chronic obstructive pulmonary disease) ([place])   . Depression   . Diabetes mellitus without complication ([place])   . Emphysema of lung ([place])   . GERD (gastroesophageal reflux disease)   . Hyperlipidemia   . Hypertension   . Kidney disease   . Liver disease   . Lung mass   . Myocardial infarction ([place])   . Opioid withdrawal ([place])   . Oxygen deficiency   . Stroke ([place])   . Tobacco use     Past Surgical History:  Procedure Laterality Date  . NECK SURGERY    . THROAT SURGERY       reports that he has been smoking cigarettes.  He started smoking about 39 years ago. He has been smoking about 1.00 pack per day. he has never used smokeless tobacco. He reports that he does not drink alcohol or use drugs.  No Known Allergies  Family History  Problem Relation Age of Onset  . Arthritis Mother   . Asthma Mother   . COPD Mother   . Depression Mother   . Hyperlipidemia Mother   .  Hypertension Mother   . Miscarriages / [place] Mother   . Alzheimer's disease Mother   . Alcohol abuse Father   . Drug abuse Father   . Early death Father 58       gunshot  . Heart disease Brother 57  . Early death Son        SIDS  . Early death Son        MVA    Prior to Admission medications   Medication Sig Start Date End Date Taking? Authorizing Provider  albuterol (PROVENTIL) (2.5 MG/3ML) 0.083% nebulizer solution Take 3 mLs (2.5 mg total) by nebulization every 6 (six) hours as needed for wheezing or shortness of breath. 07/20/17   Kathie Dike, MD   apixaban (ELIQUIS) 5 MG TABS tablet Take 1 tablet (5 mg total) by mouth 2 (two) times daily. 07/04/17   Jola Schmidt, MD  docusate sodium (COLACE) 100 MG capsule Take 100 mg by mouth 2 (two) times daily as needed for constipation.    [provider]  doxycycline (VIBRA-TABS) 100 MG tablet Take 1 tablet (100 mg total) 2 (two) times daily by mouth. 10/10/17   Francine Graven, DO  feeding supplement, GLUCERNA SHAKE, (GLUCERNA SHAKE) LIQD Take 237 mLs 5 (five) times daily by mouth.     [provider]  furosemide (LASIX) 40 MG tablet Take 60 mg daily by mouth.     [provider]  ibuprofen (ADVIL,MOTRIN) 800 MG tablet Take 800 mg by mouth every 8 (eight) hours as needed for moderate pain.    [provider]  insulin NPH Human (HUMULIN N,NOVOLIN N) 100 UNIT/ML injection Inject 8 Units 3 (three) times daily before meals into the skin.    [provider]  insulin regular (NOVOLIN R,HUMULIN R) 100 units/mL injection Inject 18 Units at bedtime into the skin.    [provider]  LORazepam (ATIVAN) 0.5 MG tablet Take 1 tablet (0.5 mg total) by mouth every 8 (eight) hours as needed for anxiety. 07/22/17   Duffy Bruce, MD  Melatonin 3 MG TABS Take 3 mg at bedtime by mouth.    [provider]  morphine (MS CONTIN) 60 MG 12 hr tablet Take 60 mg every 12 (twelve) hours by mouth.    [provider]  morphine (MSIR) 15 MG tablet Take 15 mg 3 (three) times daily by mouth.     [provider]  morphine (ROXANOL) 20 MG/ML concentrated solution Take 20 mg every 6 (six) hours as needed by mouth for breakthrough pain.    [provider]  nitroGLYCERIN (NITROSTAT) 0.4 MG SL tablet Place 1 tablet under the tongue daily as needed for chest pain. 05/23/17   [provider]  ondansetron (ZOFRAN) 4 MG tablet Take 1 tablet (4 mg total) by mouth every 8 (eight) hours as needed for nausea or vomiting. 08/29/17   Mesner, Corene Cornea, MD   OXYGEN Inhale 3 L continuous into the lungs. At home     [provider]  predniSONE (DELTASONE) 20 MG tablet Take 2 tablets (40 mg total) daily by mouth. 10/10/17   Francine Graven, DO  prochlorperazine (COMPAZINE) 5 MG tablet Take 5 mg by mouth every 6 (six) hours as needed for nausea or vomiting.    [provider]  tamsulosin (FLOMAX) 0.4 MG CAPS capsule Take 0.4 mg daily after breakfast by mouth.     [provider]    Physical Exam:  Constitutional: Chronically ill-appearing male in no acute distress Vitals:  10/11/17 1930 10/11/17 1945 10/11/17 2000 10/11/17 2015  BP:   122/88   Pulse: (!) 102 96 93 96  Resp: (!) 28 (!) 25 (!) 29 (!) 31  Temp:      TempSrc:      SpO2: 99% 98% 100% 100%  Weight:      Height:       Eyes: PERRL, lids and conjunctivae normal ENMT: Mucous membranes are moist. Posterior pharynx clear of any exudate or lesions.  Neck: normal, supple, no masses, no thyromegaly Respiratory: Tachypnea with decreased aeration and expiratory wheeze appreciated.  Patient able to talk in complete sentences. Cardiovascular: Regular rate and rhythm, no murmurs / rubs / gallops.  Trace left lower extremity  edema. 2+ pedal pulses. No carotid bruits.  Abdomen: no tenderness, no masses palpated. No hepatosplenomegaly. Bowel sounds positive.  Musculoskeletal: no clubbing / cyanosis. No joint deformity upper and lower extremities. Good ROM, no contractures. Normal muscle tone.  Skin: Erythema and increased warmth noted of the left lower extremity Neurologic: CN 2-12 grossly intact. Sensation intact, DTR normal. Strength 5/5 in all 4.  Psychiatric: Normal judgment and insight. Alert and oriented x 3. Normal mood.     Labs on Admission: I have personally reviewed following labs and imaging studies  CBC: Recent Labs  Lab 10/10/17 0908 10/10/17 2255 10/11/17 1925  WBC 9.2 13.4* 15.9*  NEUTROABS 6.3 11.2* 13.5*  HGB 12.7* 15.1 14.4  HCT 45.5  44.8 44.2  MCV 86.7 84.5 84.5  PLT 232 317 629   Basic Metabolic Panel: Recent Labs  Lab 10/10/17 0908 10/10/17 2255 10/11/17 1925  NA 131* 133* 135  K 4.0 4.8 5.0  CL 97* 97* 100*  CO2 25 26 25   GLUCOSE 255* 412* 463*  BUN 7 11 13   CREATININE 0.61 0.64 0.75  CALCIUM 8.6* 9.1 9.3   GFR: Estimated Creatinine Clearance: 111.4 mL/min (by C-G formula based on SCr of 0.75 mg/dL). Liver Function Tests: Recent Labs  Lab 10/10/17 2255 10/11/17 1925  AST 38 37  ALT 50 45  ALKPHOS 71 69  BILITOT 0.4 0.3  PROT 7.6 7.5  ALBUMIN 2.9* 3.1*   No results for input(s): LIPASE, AMYLASE in the last 168 hours. No results for input(s): AMMONIA in the last 168 hours. Coagulation Profile: No results for input(s): INR, PROTIME in the last 168 hours. Cardiac Enzymes: Recent Labs  Lab 10/10/17 0908 10/10/17 1108  TROPONINI <0.03 <0.03   BNP (last 3 results) No results for input(s): PROBNP in the last 8760 hours. HbA1C: No results for input(s): HGBA1C in the last 72 hours. CBG: Recent Labs  Lab 10/11/17 0649  GLUCAP 273*   Lipid Profile: No results for input(s): CHOL, HDL, LDLCALC, TRIG, CHOLHDL, LDLDIRECT in the last 72 hours. Thyroid Function Tests: No results for input(s): TSH, T4TOTAL, FREET4, T3FREE, THYROIDAB in the last 72 hours. Anemia Panel: No results for input(s): VITAMINB12, FOLATE, FERRITIN, TIBC, IRON, RETICCTPCT in the last 72 hours. Urine analysis:    Component Value Date/Time   COLORURINE STRAW (A) 10/11/2017 2018   APPEARANCEUR CLEAR 10/11/2017 2018   LABSPEC 1.032 (H) 10/11/2017 2018   PHURINE 7.0 10/11/2017 2018   GLUCOSEU >=500 (A) 10/11/2017 2018   HGBUR NEGATIVE 10/11/2017 2018   [place] NEGATIVE 10/11/2017 2018   KETONESUR NEGATIVE 10/11/2017 2018   PROTEINUR 30 (A) 10/11/2017 2018   NITRITE NEGATIVE 10/11/2017 2018   LEUKOCYTESUR NEGATIVE 10/11/2017 2018   Sepsis Labs: No results found for this or any previous visit (from  the past 240  hour(s)).   Radiological Exams on Admission: Dg Chest 2 View  Result Date: 10/11/2017 CLINICAL DATA:  Productive cough and fever EXAM: CHEST  2 VIEW COMPARISON:  10/10/2017 FINDINGS: Cardiac shadow is stable. Aortic calcifications are again noted. The lungs are well aerated bilaterally without focal infiltrate or sizable effusion. No acute bony abnormality is noted. IMPRESSION: No active cardiopulmonary disease. Electronically Signed   By: Inez Catalina M.D.   On: 10/11/2017 20:03   Dg Chest Portable 1 View  Result Date: 10/10/2017 CLINICAL DATA:  Shortness of breath, chest pain EXAM: PORTABLE CHEST 1 VIEW COMPARISON:  09/28/2017 FINDINGS: Heart and mediastinal contours are within normal limits. No focal opacities or effusions. No acute bony abnormality. IMPRESSION: No active disease. Electronically Signed   By: Rolm Baptise M.D.   On: 10/10/2017 09:25    EKG: Independently reviewed.  Sinus tachycardia with RBBB unchanged  Assessment/Plan Sepsis 2/2 cellulitis of the left leg: Acute.  Patient presents with erythema of the left lower extremity, WBC trending upward to 15.9, and lactic acid now 3.45.  Patient did not fill prescription previously prescribed as of yet.  E started on sepsis protocol in the ED with vancomycin and Zosyn. - Admit to a telemetry bed - Follow-up blood cultures - Continue empiric antibiotics of Vanco and Zosyn, de-escalate when medically appropriate - Trend lactic acid levels - IV fluids at 125 mL/h, as tolerated  Chronic respiratory failure, end-stage COPD with acute exacerbation:  Patient chronically on 3 liters of oxygen at baseline for which she is on hospice. - Continuous pulse oximetry overnight with nasal cannula oxygen - Prednisone 40 mg  - Duonebs QID and Prn SOB/Wheezing  Chronic pain - Continue home pain medication regimen of morphine   Diabetes mellitus type 2 with hyperglycemia:Acute. Patient last hemoglobin A1c noted to be 6.3 on 07/18/2017.  Patient  presents with a glucose of 463 on admission.  Suspect acute elevation due to patient's acute infection. - Hypoglycemic protocol - CBGs every 4 hours with distant sliding scale insulin - Adjust insulin regimen as needed  H/O CHF: Last ejection fraction unknown at this time. - Strict I's and O's.  Pulmonary embolus on anticoagulation: Patient reports taking medications of Eliquis as prescribed. - continue Eliquis  H/O IV Drug abuse: Patient reports no IV drug abuse in over 1 year.  BPH - Continue Flomax  DVT prophylaxis: Eliquis  Code Status: DNR Family Communication: No family present at bedside Disposition Plan: discharge home in 1-2 day Consults called: none Admission status: observation  Norval Morton MD [facility] Pager [phone]   If 7PM-7AM, please contact night-coverage [email or web address] Password [facility]  10/11/2017, 8:46 PM

## 2017-10-11 NOTE — Discharge Instructions (Signed)
Take the doxycycline that was prescribed yesterday for your lungs.  This will also cover cellulitis.  If you develop worsening redness, spreading redness, persistent fevers, you need to be reevaluated.

## 2017-10-11 NOTE — ED Notes (Signed)
C/o pain and swelling to left lower leg. Onset 4 days ago . Positive left pedal pulse.

## 2017-10-11 NOTE — ED Provider Notes (Signed)
[facility] EMERGENCY DEPARTMENT Provider Note   CSN: [id number] Arrival date & time: 10/11/17  1837     History   Chief Complaint Chief Complaint  Patient presents with  . Shortness of Breath    HPI Roger Flores is a 56 y.o. male.  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  He is on 3 L of nasal cannula oxygen 24 hours a day, he has medications that he takes for bronchodilation.  He also has a reported history of CHF.  According to the medical record I can detect that the patient has had a heart catheterization in 2017 approximately 1 year ago which had nonobstructive coronary disease.  Per the prior hospitalist notes from prior visit:   Patient underwent cardiac cath on 05/29/2016 at [place], which showed no significant coronary artery disease, mild pulmonary hypertension. Patient was recommended risk factor modification and daily aspirin.  The patient has been seen almost daily over the last week complaining of a variety of things including shortness of breath coughing COPD and leg redness and swelling.  He reports that he has had fevers that are as high as 102.7 yesterday, he was started on doxycycline for which she took the first dose today.  He continues to feel weak, short of breath and has severe dyspnea on exertion.  He is coughing but feels like he cannot bring up the phlegm and is having chest pain on the left side and the right side though more noted on the left side when he breathes and coughs.  Past Medical History:  Diagnosis Date  . Anxiety   . Arthritis    knees and back  . Asthma   . Cancer ([place])    Larynx  . Cataract   . CHF (congestive heart failure) ([place])   . COPD (chronic obstructive pulmonary disease) ([place])   . Depression   . Diabetes mellitus without complication ([place])   . Emphysema of  lung ([place])   . GERD (gastroesophageal reflux disease)   . Hyperlipidemia   . Hypertension   . Kidney disease   . Liver disease   . Lung mass   . Myocardial infarction ([place])   . Opioid withdrawal ([place])   . Oxygen deficiency   . Stroke ([place])   . Tobacco use     Patient Active Problem List   Diagnosis Date Noted  . Chronic pain 07/24/2017  . Stroke ([place]) 07/21/2017  . History of cancer of larynx 07/21/2017  . Self-catheterizes urinary bladder 07/21/2017  . 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 DNR no code (do not resuscitate) 07/21/2017  . Insulin dependent diabetes mellitus ([place]) 07/21/2017  . Pressure injury of skin 07/19/2017  . Chest pain 07/18/2017  . COPD, very severe ([place]) 07/18/2017  . Unstable angina pectoris ([place]) 07/01/2017    Past Surgical History:  Procedure Laterality Date  . NECK SURGERY    . THROAT SURGERY         Home Medications    Prior to Admission medications   Medication Sig Start Date End Date Taking? Authorizing Provider  albuterol (PROVENTIL) (2.5 MG/3ML) 0.083% nebulizer solution Take 3 mLs (2.5 mg total) by nebulization every 6 (six) hours as needed for wheezing or shortness of breath. 07/20/17   Kathie Dike, MD  apixaban (ELIQUIS) 5 MG TABS tablet Take 1  tablet (5 mg total) by mouth 2 (two) times daily. 07/04/17   Jola Schmidt, MD  docusate sodium (COLACE) 100 MG capsule Take 100 mg by mouth 2 (two) times daily as needed for constipation.    [provider]  doxycycline (VIBRA-TABS) 100 MG tablet Take 1 tablet (100 mg total) 2 (two) times daily by mouth. 10/10/17   Francine Graven, DO  feeding supplement, GLUCERNA SHAKE, (GLUCERNA SHAKE) LIQD Take 237 mLs 5 (five) times daily by mouth.     [provider]  furosemide (LASIX) 40 MG tablet Take 60 mg daily by mouth.     [provider]  ibuprofen (ADVIL,MOTRIN) 800 MG tablet Take 800 mg by mouth every 8 (eight) hours as needed for moderate pain.     [provider]  insulin NPH Human (HUMULIN N,NOVOLIN N) 100 UNIT/ML injection Inject 8 Units 3 (three) times daily before meals into the skin.    [provider]  insulin regular (NOVOLIN R,HUMULIN R) 100 units/mL injection Inject 18 Units at bedtime into the skin.    [provider]  LORazepam (ATIVAN) 0.5 MG tablet Take 1 tablet (0.5 mg total) by mouth every 8 (eight) hours as needed for anxiety. 07/22/17   Duffy Bruce, MD  Melatonin 3 MG TABS Take 3 mg at bedtime by mouth.    [provider]  morphine (MS CONTIN) 60 MG 12 hr tablet Take 60 mg every 12 (twelve) hours by mouth.    [provider]  morphine (MSIR) 15 MG tablet Take 15 mg 3 (three) times daily by mouth.     [provider]  morphine (ROXANOL) 20 MG/ML concentrated solution Take 20 mg every 6 (six) hours as needed by mouth for breakthrough pain.    [provider]  nitroGLYCERIN (NITROSTAT) 0.4 MG SL tablet Place 1 tablet under the tongue daily as needed for chest pain. 05/23/17   [provider]  ondansetron (ZOFRAN) 4 MG tablet Take 1 tablet (4 mg total) by mouth every 8 (eight) hours as needed for nausea or vomiting. 08/29/17   Mesner, Corene Cornea, MD  OXYGEN Inhale 3 L continuous into the lungs. At home     [provider]  predniSONE (DELTASONE) 20 MG tablet Take 2 tablets (40 mg total) daily by mouth. 10/10/17   Francine Graven, DO  prochlorperazine (COMPAZINE) 5 MG tablet Take 5 mg by mouth every 6 (six) hours as needed for nausea or vomiting.    [provider]  tamsulosin (FLOMAX) 0.4 MG CAPS capsule Take 0.4 mg daily after breakfast by mouth.     [provider]    Family History Family History  Problem Relation Age of Onset  . Arthritis Mother   . Asthma Mother   . COPD Mother   . Depression Mother   . Hyperlipidemia Mother   . Hypertension Mother   . Miscarriages / [place] Mother   . Alzheimer's disease Mother   .  Alcohol abuse Father   . Drug abuse Father   . Early death Father 63       gunshot  . Heart disease Brother 61  . Early death Son        SIDS  . Early death Son        MVA    Social History Social History   Tobacco Use  . Smoking status: Current Every Day Smoker    Packs/day: 1.00    Types: Cigarettes    Start date: 11/25/1977  .  Smokeless tobacco: Never Used  Substance Use Topics  . Alcohol use: No  . Drug use: No     Allergies   Patient has no known allergies.   Review of Systems Review of Systems  All other systems reviewed and are negative.    Physical Exam Updated Vital Signs BP 122/88   Pulse 96   Temp 97.9 F (36.6 C) (Oral)   Resp (!) 31   Ht 5\' 6"  (1.676 m)   Wt 90.7 kg (200 lb)   SpO2 100%   BMI 32.28 kg/m   Physical Exam  Constitutional: He appears well-developed and well-nourished. He appears distressed.  HENT:  Head: Normocephalic and atraumatic.  Mouth/Throat: Oropharynx is clear and moist. No oropharyngeal exudate.  Eyes: Conjunctivae and EOM are normal. Pupils are equal, round, and reactive to light. Right eye exhibits no discharge. Left eye exhibits no discharge. No scleral icterus.  Neck: Normal range of motion. Neck supple. No JVD present. No thyromegaly present.  Cardiovascular: Regular rhythm, normal heart sounds and intact distal pulses. Exam reveals no gallop and no friction rub.  No murmur heard. tachycardia  Pulmonary/Chest: He is in respiratory distress. He has wheezes. He has rales.  Abdominal: Soft. Bowel sounds are normal. He exhibits no distension and no mass. There is no tenderness.  Musculoskeletal: Normal range of motion. He exhibits edema ( mild LE edema bilaterally). He exhibits no tenderness.  Lymphadenopathy:    He has no cervical adenopathy.  Neurological: He is alert. Coordination normal.  Skin: Skin is warm and dry. No rash noted.  The LLE with redness and mild edema - hot to the touch.  Psychiatric: He has a  normal mood and affect. His behavior is normal.  Nursing note and vitals reviewed.    ED Treatments / Results  Labs (all labs ordered are listed, but only abnormal results are displayed) Labs Reviewed  CBC WITH DIFFERENTIAL/PLATELET - Abnormal; Notable for the following components:      Result Value   WBC 15.9 (*)    Neutro Abs 13.5 (*)    All other components within normal limits  COMPREHENSIVE METABOLIC PANEL - Abnormal; Notable for the following components:   Chloride 100 (*)    Glucose, Bld 463 (*)    Albumin 3.1 (*)    All other components within normal limits  URINALYSIS, ROUTINE W REFLEX MICROSCOPIC - Abnormal; Notable for the following components:   Color, Urine STRAW (*)    Specific Gravity, Urine 1.032 (*)    Glucose, UA >=500 (*)    Protein, ur 30 (*)    Bacteria, UA RARE (*)    All other components within normal limits  BLOOD GAS, ARTERIAL - Abnormal; Notable for the following components:   pO2, Arterial 126 (*)    All other components within normal limits  I-STAT CG4 LACTIC ACID, ED - Abnormal; Notable for the following components:   Lactic Acid, Venous 3.45 (*)    All other components within normal limits  CULTURE, BLOOD (ROUTINE X 2)  CULTURE, BLOOD (ROUTINE X 2)  INFLUENZA PANEL BY PCR (TYPE A & B)  I-STAT CG4 LACTIC ACID, ED    EKG  EKG Interpretation  Date/Time:  Saturday October 11 2017 18:54:41 EST Ventricular Rate:  105 PR Interval:    QRS Duration: 126 QT Interval:  348 QTC Calculation: 460 R Axis:   85 Text Interpretation:  Sinus tachycardia Right bundle branch block Inferior infarct, acute Lateral leads are also involved since last tracing  no significant change Confirmed by Noemi Chapel [phone]) on 10/11/2017 7:02:53 PM       Radiology Dg Chest 2 View  Result Date: 10/11/2017 CLINICAL DATA:  Productive cough and fever EXAM: CHEST  2 VIEW COMPARISON:  10/10/2017 FINDINGS: Cardiac shadow is stable. Aortic calcifications are again noted.  The lungs are well aerated bilaterally without focal infiltrate or sizable effusion. No acute bony abnormality is noted. IMPRESSION: No active cardiopulmonary disease. Electronically Signed   By: Inez Catalina M.D.   On: 10/11/2017 20:03   Dg Chest Portable 1 View  Result Date: 10/10/2017 CLINICAL DATA:  Shortness of breath, chest pain EXAM: PORTABLE CHEST 1 VIEW COMPARISON:  09/28/2017 FINDINGS: Heart and mediastinal contours are within normal limits. No focal opacities or effusions. No acute bony abnormality. IMPRESSION: No active disease. Electronically Signed   By: Rolm Baptise M.D.   On: 10/10/2017 09:25    Procedures .Critical Care Performed by: Noemi Chapel, MD Authorized by: Noemi Chapel, MD   Critical care provider statement:    Critical care time (minutes):  35   Critical care time was exclusive of:  Separately billable procedures and treating other patients   Critical care was necessary to treat or prevent imminent or life-threatening deterioration of the following conditions:  Sepsis and respiratory failure   Critical care was time spent personally by me on the following activities:  Ordering and performing treatments and interventions, ordering and review of laboratory studies, ordering and review of radiographic studies, pulse oximetry, re-evaluation of patient's condition, review of old charts, obtaining history from patient or surrogate, evaluation of patient's response to treatment, discussions with consultants and development of treatment plan with patient or surrogate   (including critical care time)  Medications Ordered in ED Medications  0.9 %  sodium chloride infusion (1,000 mLs Intravenous New Bag/Given 10/11/17 1931)  piperacillin-tazobactam (ZOSYN) IVPB 3.375 g (0 g Intravenous Stopped 10/11/17 2000)  vancomycin (VANCOCIN) IVPB 1000 mg/200 mL premix (0 mg Intravenous Stopped 10/11/17 2043)  morphine 4 MG/ML injection 4 mg (4 mg Intravenous Given 10/11/17 2021)      Initial Impression / Assessment and Plan / ED Course  I have reviewed the triage vital signs and the nursing notes.  Pertinent labs & imaging results that were available during my care of the patient were reviewed by me and considered in my medical decision making (see chart for details).     The patient does have some abnormal findings of rales, rhonchi and some wheezing, it is not clear whether this represents worsening pulmonary infection versus the fever coming from his left lower extremity which very clearly has a cellulitis from the top of the foot extending up towards the knee.  Either way the patient appears to be tachycardic, he is reporting fevers and appears to be an increased work of breathing.  He will have chest x-ray to rule out pneumonia despite having recent negative x-rays, lab work, albuterol treatment, anticipate broader spectrum antibiotics and admission to the hospital.  Leukocytosis Hyperglycemia ABG without acidosis Has chronic CP / lung pain for which he takes morphine Lactic acid is elevated fluis and abx given.  30cc bolus not indicated, no hypotension and no lactic acid over 4. D/w DR. Tamala Julian who will admit.    Final Clinical Impressions(s) / ED Diagnoses   Final diagnoses:  Sepsis, due to unspecified organism [facility])  Hyperglycemia  Cellulitis of left lower extremity    ED Discharge Orders    None  Noemi Chapel, MD 10/11/17 2055

## 2017-10-11 NOTE — ED Notes (Addendum)
Date and time results received: 10/11/17 10/11/2017 @19 :35 Test: lactic acid 3.45 Critical Value: lactic acid 3.45  Name of Provider Notified: Dr Sabra Heck  Orders Received? Or Actions Taken?: no additional orders given,

## 2017-10-11 NOTE — Progress Notes (Signed)
Pharmacy Antibiotic Note  Roger Flores is a 56 y.o. male admitted on 10/11/2017 with cellulitis.  Pharmacy has been consulted for Vancomycin and zosyn dosing.  Plan: Vancomycin 2000mg  loading dose, then 1500mg  IV every 12 hours.  Goal trough 10-15 mcg/mL. Zosyn 3.375g IV q8h (4 hour infusion).  F/U cxs and clinical progress Monitor V/S, labs and levels as indicated  Height: 5\' 6"  (167.6 cm) Weight: 200 lb (90.7 kg) IBW/kg (Calculated) : 63.8  Temp (24hrs), Avg:97.7 F (36.5 C), Min:97.4 F (36.3 C), Max:97.9 F (36.6 C)  Recent Labs  Lab 10/10/17 0908 10/10/17 1108 10/10/17 2255 10/10/17 2323 10/11/17 0227 10/11/17 1925 10/11/17 1933  WBC 9.2  --  13.4*  --   --  15.9*  --   CREATININE 0.61  --  0.64  --   --  0.75  --   LATICACIDVEN 0.9 1.1  --  2.34* 2.76*  --  3.45*    Estimated Creatinine Clearance: 111.4 mL/min (by C-G formula based on SCr of 0.75 mg/dL).    No Known Allergies  Antimicrobials this admission: Vancomycin 11/17 >>  Zosyn 11/17 >>  Dose adjustments this admission: N/A  Microbiology results: 11/17 BCx: pending 11/17 Influenzae panel: negative  Thank you for allowing pharmacy to be a part of this patient's care.  Isac Sarna, BS [place], [place] Clinical Pharmacist Pager [phone] 10/11/2017 9:12 PM

## 2017-10-11 NOTE — ED Notes (Signed)
When attempting to get IV started on pt, noticed pt had marking on both inner arms. Pt asked whether or not he used IV drugs. Stated he wasn't going to answer that and then proceeded to tell us he has liquid morphine at home and he injects himself with this. Pt now requesting Morphine for pain. Dr. Sabra Heck notified of above. No pain medicine ordered.

## 2017-10-12 ENCOUNTER — Other Ambulatory Visit: Payer: Self-pay

## 2017-10-12 LAB — BASIC METABOLIC PANEL
Anion gap: 6 (ref 5–15)
BUN: 12 mg/dL (ref 6–20)
CHLORIDE: 106 mmol/L (ref 101–111)
CO2: 27 mmol/L (ref 22–32)
CREATININE: 0.52 mg/dL — AB (ref 0.61–1.24)
Calcium: 8.8 mg/dL — ABNORMAL LOW (ref 8.9–10.3)
GFR calc non Af Amer: >60 mL/min (ref >60)
GLUCOSE: 184 mg/dL — AB (ref 65–99)
Potassium: 3.9 mmol/L (ref 3.5–5.1)
Sodium: 139 mmol/L (ref 135–145)

## 2017-10-12 LAB — GLUCOSE, CAPILLARY
GLUCOSE-CAPILLARY: 140 mg/dL — AB (ref 65–99)
GLUCOSE-CAPILLARY: 185 mg/dL — AB (ref 65–99)
GLUCOSE-CAPILLARY: 324 mg/dL — AB (ref 65–99)
GLUCOSE-CAPILLARY: 348 mg/dL — AB (ref 65–99)
GLUCOSE-CAPILLARY: 359 mg/dL — AB (ref 65–99)
GLUCOSE-CAPILLARY: 393 mg/dL — AB (ref 65–99)
GLUCOSE-CAPILLARY: 394 mg/dL — AB (ref 65–99)
GLUCOSE-CAPILLARY: 441 mg/dL — AB (ref 65–99)

## 2017-10-12 LAB — CBC
HEMATOCRIT: 40.7 % (ref 39.0–52.0)
Hemoglobin: 13.2 g/dL (ref 13.0–17.0)
MCH: 27.7 pg (ref 26.0–34.0)
MCHC: 32.4 g/dL (ref 30.0–36.0)
MCV: 85.3 fL (ref 78.0–100.0)
Platelets: 280 10*3/uL (ref 150–400)
RBC: 4.77 MIL/uL (ref 4.22–5.81)
RDW: 13.5 % (ref 11.5–15.5)
WBC: 13.9 10*3/uL — ABNORMAL HIGH (ref 4.0–10.5)

## 2017-10-12 LAB — LACTIC ACID, PLASMA
LACTIC ACID, VENOUS: 1.9 mmol/L (ref 0.5–1.9)
Lactic Acid, Venous: 1.2 mmol/L (ref 0.5–1.9)

## 2017-10-12 MED ORDER — INSULIN ASPART 100 UNIT/ML ~~LOC~~ SOLN: 0.0000 [IU] | Freq: Three times a day (TID) | SUBCUTANEOUS | Status: DC

## 2017-10-12 MED ORDER — INSULIN ASPART 100 UNIT/ML ~~LOC~~ SOLN
0.0000 [IU] | Freq: Every day | SUBCUTANEOUS | Status: DC
  Administered 2017-10-12: 7 [IU] via SUBCUTANEOUS

## 2017-10-12 NOTE — Progress Notes (Signed)
PROGRESS NOTE    Roger Flores  PPJ:[id number] DOB: [birth year] DOA: 10/11/2017 PCP: Jani Gravel, MD    Brief Narrative:  56 year old male with a history of hypertension, hyperlipidemia, chronic respiratory failure with end-stage COPD, who is followed by community hospice, presents to the hospital with left leg pain and shortness of breath.  He was found to have elevated lactic acid and evidence of left leg cellulitis.  Started on intravenous antibiotics with some improvement.  Respiratory status currently appears to be at baseline.  He will likely need another 24 hours of IV antibiotics.  Anticipate discharge home tomorrow.   Assessment & Plan:   Active Problems:   COPD, very severe ([place])   DNR no code (do not resuscitate)   Insulin dependent diabetes mellitus ([facility])   Chronic pain   Sepsis ([facility])   Cellulitis of leg, left   1. Cellulitis of left leg.  Patient has been started on intravenous antibiotics.  Cellulitis appears to be slowly improving.  Lactic acid trended down with IV fluids.  Blood cultures show no growth.  Continue current treatments. 2. Chronic respiratory failure with end-stage COPD.  Respiratory status appears to be near baseline at this time.  He is chronically on prednisone 40 mg daily.  Continue on bronchodilators. 3. Insulin-dependent diabetes.  Continue on sliding scale insulin for now.  Blood sugars have been elevated.  He did not receive any insulin today.  We will continue to monitor while on sliding scale. 4. Chronic pain.  Continue home regimen of morphine. 5. History of pulmonary embolus.  Patient is chronically on Eliquis. 6. BPH.  Continue Flomax 7. History of IV drug use.  Patient reports no IV drug abuse in over 1 year.   DVT prophylaxis: Apixaban Code Status: DNR Family Communication: Discussed with wife at the bedside Disposition Plan: Discharge home once improved   Consultants:     Procedures:     Antimicrobials:   Vancomycin 11/17  >  Zosyn 11/17 >   Subjective: No shortness of breath.  Feels that lower extremity erythema is improving.  Still has some pain in lower extremity.  Objective: Vitals:   10/12/17 0750 10/12/17 1109 10/12/17 1500 10/12/17 1534  BP:   125/75   Pulse:   68   Resp:   20   Temp:   99.1 F (37.3 C)   TempSrc:   Oral   SpO2: 95% 98% 98% 97%  Weight:      Height:        Intake/Output Summary (Last 24 hours) at 10/12/2017 1803 Last data filed at 10/12/2017 1502 Gross per 24 hour  Intake 3289.58 ml  Output 400 ml  Net 2889.58 ml   Filed Weights   10/11/17 1843 10/12/17 0600  Weight: 90.7 kg (200 lb) 90.7 kg (200 lb)    Examination:  General exam: Appears calm and comfortable  Respiratory system: Clear to auscultation. Respiratory effort normal. Cardiovascular system: S1 & S2 heard, RRR. No JVD, murmurs, rubs, gallops or clicks. No pedal edema. Gastrointestinal system: Abdomen is nondistended, soft and nontender. No organomegaly or masses felt. Normal bowel sounds heard. Central nervous system: Alert and oriented. No focal neurological deficits. Extremities: Symmetric 5 x 5 power. Skin: erythema/swelling noted in LLE Psychiatry: Judgement and insight appear normal. Mood & affect appropriate.     Data Reviewed: I have personally reviewed following labs and imaging studies  CBC: Recent Labs  Lab 10/10/17 0908 10/10/17 2255 10/11/17 1925 10/12/17 0346  WBC 9.2 13.4* 15.9*  13.9*  NEUTROABS 6.3 11.2* 13.5*  --   HGB 12.7* 15.1 14.4 13.2  HCT 45.5 44.8 44.2 40.7  MCV 86.7 84.5 84.5 85.3  PLT 232 317 312 588   Basic Metabolic Panel: Recent Labs  Lab 10/10/17 0908 10/10/17 2255 10/11/17 1925 10/12/17 0346  NA 131* 133* 135 139  K 4.0 4.8 5.0 3.9  CL 97* 97* 100* 106  CO2 25 26 25 27   GLUCOSE 255* 412* 463* 184*  BUN 7 11 13 12   CREATININE 0.61 0.64 0.75 0.52*  CALCIUM 8.6* 9.1 9.3 8.8*   GFR: Estimated Creatinine Clearance: 111.4 mL/min (A) (by C-G formula  based on SCr of 0.52 mg/dL (L)). Liver Function Tests: Recent Labs  Lab 10/10/17 2255 10/11/17 1925  AST 38 37  ALT 50 45  ALKPHOS 71 69  BILITOT 0.4 0.3  PROT 7.6 7.5  ALBUMIN 2.9* 3.1*   No results for input(s): LIPASE, AMYLASE in the last 168 hours. No results for input(s): AMMONIA in the last 168 hours. Coagulation Profile: No results for input(s): INR, PROTIME in the last 168 hours. Cardiac Enzymes: Recent Labs  Lab 10/10/17 0908 10/10/17 1108  TROPONINI <0.03 <0.03   BNP (last 3 results) No results for input(s): PROBNP in the last 8760 hours. HbA1C: No results for input(s): HGBA1C in the last 72 hours. CBG: Recent Labs  Lab 10/12/17 0050 10/12/17 0352 10/12/17 0738 10/12/17 1141 10/12/17 1606  GLUCAP 324* 185* 140* 393* 359*   Lipid Profile: No results for input(s): CHOL, HDL, LDLCALC, TRIG, CHOLHDL, LDLDIRECT in the last 72 hours. Thyroid Function Tests: No results for input(s): TSH, T4TOTAL, FREET4, T3FREE, THYROIDAB in the last 72 hours. Anemia Panel: No results for input(s): VITAMINB12, FOLATE, FERRITIN, TIBC, IRON, RETICCTPCT in the last 72 hours. Sepsis Labs: Recent Labs  Lab 10/11/17 0227 10/11/17 1933 10/11/17 2357 10/12/17 0347  LATICACIDVEN 2.76* 3.45* 1.9 1.2    Recent Results (from the past 240 hour(s))  Blood Culture (routine x 2)     Status: None (Preliminary result)   Collection Time: 10/11/17  7:25 PM  Result Value Ref Range Status   Specimen Description BLOOD RIGHT HAND  Final   Special Requests   Final    BOTTLES DRAWN AEROBIC AND ANAEROBIC Blood Culture adequate volume DRAWN BY RN   Culture NO GROWTH < 12 HOURS  Final   Report Status PENDING  Incomplete  Blood Culture (routine x 2)     Status: None (Preliminary result)   Collection Time: 10/11/17  7:30 PM  Result Value Ref Range Status   Specimen Description BLOOD LEFT HAND  Final   Special Requests   Final    BOTTLES DRAWN AEROBIC AND ANAEROBIC Blood Culture adequate volume    Culture NO GROWTH < 12 HOURS  Final   Report Status PENDING  Incomplete         Radiology Studies: Dg Chest 2 View  Result Date: 10/11/2017 CLINICAL DATA:  Productive cough and fever EXAM: CHEST  2 VIEW COMPARISON:  10/10/2017 FINDINGS: Cardiac shadow is stable. Aortic calcifications are again noted. The lungs are well aerated bilaterally without focal infiltrate or sizable effusion. No acute bony abnormality is noted. IMPRESSION: No active cardiopulmonary disease. Electronically Signed   By: Inez Catalina M.D.   On: 10/11/2017 20:03        Scheduled Meds: . apixaban  5 mg Oral BID  . feeding supplement (GLUCERNA SHAKE)  237 mL Oral 5 X Daily  . insulin aspart  0-20  Units Subcutaneous Q4H  . ipratropium-albuterol  3 mL Nebulization STAT  . ipratropium-albuterol  3 mL Nebulization QID  . morphine  60 mg Oral Q12H  . morphine  15 mg Oral TID  . predniSONE  40 mg Oral Daily  . tamsulosin  0.4 mg Oral QPC supper   Continuous Infusions: . sodium chloride 1,000 mL (10/12/17 1442)  . piperacillin-tazobactam (ZOSYN)  IV 3.375 g (10/12/17 1442)  . vancomycin Stopped (10/12/17 1156)     LOS: 1 day    Time spent: 43mins    Kathie Dike, MD [facility] Pager [phone]  If 7PM-7AM, please contact night-coverage [email or web address] Password [facility] 10/12/2017, 6:03 PM

## 2017-10-12 NOTE — Progress Notes (Signed)
PHARMACIST - PHYSICIAN ORDER COMMUNICATION  CONCERNING: P&T Medication Policy on Herbal Medications  DESCRIPTION:  This patient's order for:  melatonin  has been noted.  This product(s) is classified as an "herbal" or natural product. Due to a lack of definitive safety studies or FDA approval, nonstandard manufacturing practices, plus the potential risk of unknown drug-drug interactions while on inpatient medications, the Pharmacy and Therapeutics Committee does not permit the use of "herbal" or natural products of this type within [facility].   ACTION TAKEN: The pharmacy department is unable to verify this order at this time and your patient has been informed of this safety policy. Please reevaluate patient's clinical condition at discharge and address if the herbal or natural product(s) should be resumed at that time.  Thanks, Pharmacy

## 2017-10-13 ENCOUNTER — Encounter (HOSPITAL_COMMUNITY): Payer: Self-pay | Admitting: Emergency Medicine

## 2017-10-13 ENCOUNTER — Emergency Department (HOSPITAL_COMMUNITY)
Admission: EM | Admit: 2017-10-13 | Discharge: 2017-10-13 | Disposition: A | Discharge status: Home / Self Care | Payer: Medicare Other | Attending: Emergency Medicine | Admitting: Emergency Medicine

## 2017-10-13 DIAGNOSIS — M79662 Pain in left lower leg: Secondary | ICD-10-CM | POA: Insufficient documentation

## 2017-10-13 DIAGNOSIS — Z5321 Procedure and treatment not carried out due to patient leaving prior to being seen by health care provider: Secondary | ICD-10-CM | POA: Insufficient documentation

## 2017-10-13 LAB — PROTIME-INR
INR: 0.96
Prothrombin Time: 12.7 seconds (ref 11.4–15.2)

## 2017-10-13 LAB — COMPREHENSIVE METABOLIC PANEL
ALT: 50 U/L (ref 17–63)
AST: 46 U/L — ABNORMAL HIGH (ref 15–41)
Albumin: 2.8 g/dL — ABNORMAL LOW (ref 3.5–5.0)
Alkaline Phosphatase: 69 U/L (ref 38–126)
Anion gap: 10 (ref 5–15)
BUN: 12 mg/dL (ref 6–20)
CHLORIDE: 97 mmol/L — AB (ref 101–111)
CO2: 24 mmol/L (ref 22–32)
CREATININE: 0.7 mg/dL (ref 0.61–1.24)
Calcium: 8.6 mg/dL — ABNORMAL LOW (ref 8.9–10.3)
Glucose, Bld: 371 mg/dL — ABNORMAL HIGH (ref 65–99)
POTASSIUM: 4.6 mmol/L (ref 3.5–5.1)
SODIUM: 131 mmol/L — AB (ref 135–145)
Total Bilirubin: 0.2 mg/dL — ABNORMAL LOW (ref 0.3–1.2)
Total Protein: 7 g/dL (ref 6.5–8.1)

## 2017-10-13 LAB — CBC WITH DIFFERENTIAL/PLATELET
BASOS ABS: 0 10*3/uL (ref 0.0–0.1)
Basophils Relative: 0 %
EOS PCT: 1 %
Eosinophils Absolute: 0.2 10*3/uL (ref 0.0–0.7)
HEMATOCRIT: 45.9 % (ref 39.0–52.0)
HEMOGLOBIN: 15 g/dL (ref 13.0–17.0)
LYMPHS ABS: 4.2 10*3/uL — AB (ref 0.7–4.0)
LYMPHS PCT: 26 %
MCH: 28.2 pg (ref 26.0–34.0)
MCHC: 32.7 g/dL (ref 30.0–36.0)
MCV: 86.4 fL (ref 78.0–100.0)
MONOS PCT: 10 %
Monocytes Absolute: 1.6 10*3/uL — ABNORMAL HIGH (ref 0.1–1.0)
NEUTROS PCT: 63 %
Neutro Abs: 10.2 10*3/uL — ABNORMAL HIGH (ref 1.7–7.7)
Platelets: 259 10*3/uL (ref 150–400)
RBC: 5.31 MIL/uL (ref 4.22–5.81)
RDW: 13.8 % (ref 11.5–15.5)
WBC: 16.2 10*3/uL — AB (ref 4.0–10.5)

## 2017-10-13 LAB — I-STAT CG4 LACTIC ACID, ED: LACTIC ACID, VENOUS: 1.93 mmol/L — AB (ref 0.5–1.9)

## 2017-10-13 NOTE — Progress Notes (Signed)
PT left unit AMA due to being "worried sick about animals." PT advised and fully read and educated on AMA and liability. PT educated on need for admission and medications. PT  IV and telemetry removed. PT advised on checking glucose regularly due to being high and maintaining the cellulitis of the Left Lower Leg.

## 2017-10-13 NOTE — ED Notes (Signed)
Pt name has been called several times with no answer

## 2017-10-13 NOTE — ED Triage Notes (Addendum)
Pt states he was seen at [facility] for sepsis. Left lower leg infection. Pt states he left today (AMA) due to feeling better. Once pt got home, Pt states his left lower leg is worse, more red, swollen, and painful.

## 2017-10-16 LAB — CULTURE, BLOOD (ROUTINE X 2)
Culture: NO GROWTH
Culture: NO GROWTH
Special Requests: ADEQUATE

## 2017-10-18 LAB — CULTURE, BLOOD (ROUTINE X 2)
Culture: NO GROWTH
Culture: NO GROWTH
SPECIAL REQUESTS: ADEQUATE
Special Requests: ADEQUATE

## 2017-10-22 NOTE — Discharge Summary (Addendum)
Physician Discharge Summary  Roger Flores IWL:[id number] DOB: 1961/08/22 DOA: 10/11/2017  PCP: Jani Gravel, MD  Admit date: 10/11/2017 Discharge date: 10/13/2017  [place] AGAINST MEDICAL ADVICE   Brief/Interim Summary: This patient with chronic respiratory failure and end-stage COPD was admitted to the hospital with left leg cellulitis.  He was started on intravenous antibiotics and cellulitis began to slowly improve.  The remainder of his medical issues were stable.  Respiratory status appears to be at baseline.  Had a history of pulmonary embolus and was continued on chronic Eliquis.  On 11/29 at 2:45 AM, the patient decided to leave the hospital [facility].  Review of records indicate that he was "worried sick about animals".  Discharge Diagnoses:  Active Problems:   COPD, very severe ([place])   DNR no code (do not resuscitate)   Insulin dependent diabetes mellitus ([place])   Chronic pain   Sepsis ([place])   Cellulitis of leg, left    Discharge Instructions   Allergies as of 10/13/2017   No Known Allergies     Medication List    ASK your doctor about these medications   albuterol (2.5 MG/3ML) 0.083% nebulizer solution Commonly known as:  PROVENTIL Take 3 mLs (2.5 mg total) by nebulization every 6 (six) hours as needed for wheezing or shortness of breath.   apixaban 5 MG Tabs tablet Commonly known as:  ELIQUIS Take 1 tablet (5 mg total) by mouth 2 (two) times daily.   docusate sodium 100 MG capsule Commonly known as:  COLACE Take 100 mg by mouth 2 (two) times daily as needed for constipation.   doxycycline 100 MG tablet Commonly known as:  VIBRA-TABS Take 1 tablet (100 mg total) 2 (two) times daily by mouth.   feeding supplement (GLUCERNA SHAKE) Liqd Take 237 mLs 5 (five) times daily by mouth.   furosemide 40 MG tablet Commonly known as:  LASIX Take 60 mg daily by mouth.   ibuprofen 800 MG tablet Commonly known as:  ADVIL,MOTRIN Take  800 mg by mouth every 8 (eight) hours as needed for moderate pain.   insulin NPH Human 100 UNIT/ML injection Commonly known as:  HUMULIN N,NOVOLIN N Inject 8 Units 3 (three) times daily before meals into the skin.   insulin regular 100 units/mL injection Commonly known as:  NOVOLIN R,HUMULIN R Inject 18 Units at bedtime into the skin.   LORazepam 0.5 MG tablet Commonly known as:  ATIVAN Take 1 tablet (0.5 mg total) by mouth every 8 (eight) hours as needed for anxiety.   Melatonin 3 MG Tabs Take 3 mg at bedtime by mouth.   morphine 15 MG tablet Commonly known as:  MSIR Take 15 mg 3 (three) times daily by mouth.   morphine 60 MG 12 hr tablet Commonly known as:  MS CONTIN Take 60 mg every 12 (twelve) hours by mouth.   morphine 20 MG/ML concentrated solution Commonly known as:  ROXANOL Take 20 mg every 6 (six) hours as needed by mouth for breakthrough pain.   nitroGLYCERIN 0.4 MG SL tablet Commonly known as:  NITROSTAT Place 1 tablet under the tongue daily as needed for chest pain.   ondansetron 4 MG tablet Commonly known as:  ZOFRAN Take 1 tablet (4 mg total) by mouth every 8 (eight) hours as needed for nausea or vomiting.   OXYGEN Inhale 3 L continuous into the lungs. At home   predniSONE 20 MG tablet Commonly known as:  DELTASONE Take 2 tablets (40 mg  total) daily by mouth.   prochlorperazine 5 MG tablet Commonly known as:  COMPAZINE Take 5 mg by mouth every 6 (six) hours as needed for nausea or vomiting.   tamsulosin 0.4 MG Caps capsule Commonly known as:  FLOMAX Take 0.4 mg daily after breakfast by mouth.       No Known Allergies  Consultations:     Procedures/Studies: Dg Chest 2 View  Result Date: 10/11/2017 CLINICAL DATA:  Productive cough and fever EXAM: CHEST  2 VIEW COMPARISON:  10/10/2017 FINDINGS: Cardiac shadow is stable. Aortic calcifications are again noted. The lungs are well aerated bilaterally without focal infiltrate or sizable  effusion. No acute bony abnormality is noted. IMPRESSION: No active cardiopulmonary disease. Electronically Signed   By: Inez Catalina M.D.   On: 10/11/2017 20:03   Dg Chest 2 View  Result Date: 09/28/2017 CLINICAL DATA:  56 year old male with shortness of breath. EXAM: CHEST  2 VIEW COMPARISON:  09/25/2017 FINDINGS: The heart size and mediastinal contours are within normal limits. Left basilar atelectasis/ scarring again noted. The lungs are otherwise clear. No pleural effusions or pneumothorax. The visualized skeletal structures are unremarkable. IMPRESSION: No active cardiopulmonary disease. Minimal left basilar atelectasis/ scarring again noted. Electronically Signed   By: Kristopher Oppenheim M.D.   On: 09/28/2017 14:45   Dg Chest 2 View  Result Date: 09/25/2017 CLINICAL DATA:  Increased dyspnea over the past 24 hours. EXAM: CHEST  2 VIEW COMPARISON:  09/01/2017. FINDINGS: The heart size and mediastinal contours are within normal limits. Aortic atherosclerosis at the arch without aneurysm. Left basilar atelectasis and/or scarring. No pneumonic consolidation nor overt pulmonary edema. No pneumothorax or pleural effusion. The visualized skeletal structures are unremarkable. IMPRESSION: 1. Aortic atherosclerosis. 2. Left basilar atelectasis and/or scarring. No acute pulmonary disease. Electronically Signed   By: Ashley Royalty M.D.   On: 09/25/2017 20:55   Dg Chest Portable 1 View  Result Date: 10/10/2017 CLINICAL DATA:  Shortness of breath, chest pain EXAM: PORTABLE CHEST 1 VIEW COMPARISON:  09/28/2017 FINDINGS: Heart and mediastinal contours are within normal limits. No focal opacities or effusions. No acute bony abnormality. IMPRESSION: No active disease. Electronically Signed   By: Rolm Baptise M.D.   On: 10/10/2017 09:25       The results of significant diagnostics from this hospitalization (including imaging, microbiology, ancillary and laboratory) are listed below for reference.      Microbiology: Recent Results (from the past 240 hour(s))  Culture, blood (Routine x 2)     Status: None   Collection Time: 10/13/17  5:08 PM  Result Value Ref Range Status   Specimen Description BLOOD RIGHT HAND  Final   Special Requests IN PEDIATRIC BOTTLE Blood Culture adequate volume  Final   Culture NO GROWTH 5 DAYS  Final   Report Status 10/18/2017 FINAL  Final  Culture, blood (Routine x 2)     Status: None   Collection Time: 10/13/17  5:13 PM  Result Value Ref Range Status   Specimen Description BLOOD LEFT HAND  Final   Special Requests IN PEDIATRIC BOTTLE Blood Culture adequate volume  Final   Culture NO GROWTH 5 DAYS  Final   Report Status 10/18/2017 FINAL  Final     Labs: BNP (last 3 results) Recent Labs    07/03/17 2022 07/18/17 2052 10/10/17 0908  BNP 8.2 64.0 82.4   Basic Metabolic Panel: No results for input(s): NA, K, CL, CO2, GLUCOSE, BUN, CREATININE, CALCIUM, MG, PHOS in the last 168 hours.  Liver Function Tests: No results for input(s): AST, ALT, ALKPHOS, BILITOT, PROT, ALBUMIN in the last 168 hours. No results for input(s): LIPASE, AMYLASE in the last 168 hours. No results for input(s): AMMONIA in the last 168 hours. CBC: No results for input(s): WBC, NEUTROABS, HGB, HCT, MCV, PLT in the last 168 hours. Cardiac Enzymes: No results for input(s): CKTOTAL, CKMB, CKMBINDEX, TROPONINI in the last 168 hours. BNP: Invalid input(s): POCBNP CBG: No results for input(s): GLUCAP in the last 168 hours. D-Dimer No results for input(s): DDIMER in the last 72 hours. Hgb A1c No results for input(s): HGBA1C in the last 72 hours. Lipid Profile No results for input(s): CHOL, HDL, LDLCALC, TRIG, CHOLHDL, LDLDIRECT in the last 72 hours. Thyroid function studies No results for input(s): TSH, T4TOTAL, T3FREE, THYROIDAB in the last 72 hours.  Invalid input(s): FREET3 Anemia work up No results for input(s): VITAMINB12, FOLATE, FERRITIN, TIBC, IRON, RETICCTPCT in the  last 72 hours. Urinalysis    Component Value Date/Time   COLORURINE STRAW (A) 10/11/2017 2018   APPEARANCEUR CLEAR 10/11/2017 2018   LABSPEC 1.032 (H) 10/11/2017 2018   PHURINE 7.0 10/11/2017 2018   GLUCOSEU >=500 (A) 10/11/2017 2018   HGBUR NEGATIVE 10/11/2017 2018   [place] NEGATIVE 10/11/2017 2018   KETONESUR NEGATIVE 10/11/2017 2018   PROTEINUR 30 (A) 10/11/2017 2018   NITRITE NEGATIVE 10/11/2017 2018   LEUKOCYTESUR NEGATIVE 10/11/2017 2018   Sepsis Labs Invalid input(s): PROCALCITONIN,  WBC,  LACTICIDVEN Microbiology Recent Results (from the past 240 hour(s))  Culture, blood (Routine x 2)     Status: None   Collection Time: 10/13/17  5:08 PM  Result Value Ref Range Status   Specimen Description BLOOD RIGHT HAND  Final   Special Requests IN PEDIATRIC BOTTLE Blood Culture adequate volume  Final   Culture NO GROWTH 5 DAYS  Final   Report Status 10/18/2017 FINAL  Final  Culture, blood (Routine x 2)     Status: None   Collection Time: 10/13/17  5:13 PM  Result Value Ref Range Status   Specimen Description BLOOD LEFT HAND  Final   Special Requests IN PEDIATRIC BOTTLE Blood Culture adequate volume  Final   Culture NO GROWTH 5 DAYS  Final   Report Status 10/18/2017 FINAL  Final     Time coordinating discharge: less than 30 minutes  SIGNED:   Kathie Dike, MD  [facility] 10/22/2017, 8:30 PM Pager   If 7PM-7AM, please contact night-coverage [email or web address] Password [email or web address]

## 2018-01-10 ENCOUNTER — Emergency Department (HOSPITAL_COMMUNITY): Payer: Medicare Other

## 2018-01-10 ENCOUNTER — Encounter (HOSPITAL_COMMUNITY): Payer: Self-pay

## 2018-01-10 ENCOUNTER — Emergency Department (HOSPITAL_COMMUNITY)
Admission: EM | Admit: 2018-01-10 | Discharge: 2018-01-11 | Disposition: A | Discharge status: Home / Self Care | Payer: Medicare Other | Attending: Emergency Medicine | Admitting: Emergency Medicine

## 2018-01-10 DIAGNOSIS — I509 Heart failure, unspecified: Secondary | ICD-10-CM | POA: Insufficient documentation

## 2018-01-10 DIAGNOSIS — J449 Chronic obstructive pulmonary disease, unspecified: Secondary | ICD-10-CM | POA: Diagnosis not present

## 2018-01-10 DIAGNOSIS — I252 Old myocardial infarction: Secondary | ICD-10-CM | POA: Diagnosis not present

## 2018-01-10 DIAGNOSIS — Z8673 Personal history of transient ischemic attack (TIA), and cerebral infarction without residual deficits: Secondary | ICD-10-CM | POA: Insufficient documentation

## 2018-01-10 DIAGNOSIS — I110 Hypertensive heart disease with heart failure: Secondary | ICD-10-CM | POA: Insufficient documentation

## 2018-01-10 DIAGNOSIS — Z79899 Other long term (current) drug therapy: Secondary | ICD-10-CM | POA: Diagnosis not present

## 2018-01-10 DIAGNOSIS — F17210 Nicotine dependence, cigarettes, uncomplicated: Secondary | ICD-10-CM | POA: Insufficient documentation

## 2018-01-10 DIAGNOSIS — E785 Hyperlipidemia, unspecified: Secondary | ICD-10-CM | POA: Insufficient documentation

## 2018-01-10 DIAGNOSIS — Z794 Long term (current) use of insulin: Secondary | ICD-10-CM | POA: Diagnosis not present

## 2018-01-10 DIAGNOSIS — Z7901 Long term (current) use of anticoagulants: Secondary | ICD-10-CM | POA: Diagnosis not present

## 2018-01-10 DIAGNOSIS — R739 Hyperglycemia, unspecified: Secondary | ICD-10-CM

## 2018-01-10 DIAGNOSIS — E1165 Type 2 diabetes mellitus with hyperglycemia: Secondary | ICD-10-CM | POA: Diagnosis not present

## 2018-01-10 DIAGNOSIS — G893 Neoplasm related pain (acute) (chronic): Secondary | ICD-10-CM | POA: Insufficient documentation

## 2018-01-10 LAB — CBG MONITORING, ED: Glucose-Capillary: 227 mg/dL — ABNORMAL HIGH (ref 65–99)

## 2018-01-10 MED ORDER — MORPHINE SULFATE (PF) 4 MG/ML IV SOLN
4.0000 mg | Freq: Once | INTRAVENOUS | Status: AC
  Administered 2018-01-10: 4 mg via INTRAMUSCULAR
  Filled 2018-01-10: qty 1

## 2018-01-10 MED ORDER — MORPHINE SULFATE ER 15 MG PO TBCR: 60.0000 mg | EXTENDED_RELEASE_TABLET | Freq: Two times a day (BID) | ORAL | Status: DC

## 2018-01-10 MED ORDER — ALBUTEROL SULFATE (2.5 MG/3ML) 0.083% IN NEBU
5.0000 mg | INHALATION_SOLUTION | Freq: Once | RESPIRATORY_TRACT | Status: AC
  Administered 2018-01-10: 5 mg via RESPIRATORY_TRACT
  Filled 2018-01-10: qty 6

## 2018-01-10 MED ORDER — METHYLPREDNISOLONE SODIUM SUCC 125 MG IJ SOLR
80.0000 mg | Freq: Once | INTRAMUSCULAR | Status: AC
  Administered 2018-01-10: 80 mg via INTRAMUSCULAR
  Filled 2018-01-10: qty 2

## 2018-01-10 MED ORDER — MORPHINE SULFATE ER 15 MG PO TBCR
60.0000 mg | EXTENDED_RELEASE_TABLET | Freq: Two times a day (BID) | ORAL | Status: AC
  Administered 2018-01-11: 60 mg via ORAL
  Filled 2018-01-10: qty 4

## 2018-01-10 MED ORDER — IPRATROPIUM BROMIDE 0.02 % IN SOLN
0.5000 mg | Freq: Once | RESPIRATORY_TRACT | Status: AC
  Administered 2018-01-10: 0.5 mg via RESPIRATORY_TRACT
  Filled 2018-01-10: qty 2.5

## 2018-01-10 NOTE — ED Provider Notes (Signed)
[facility] EMERGENCY DEPARTMENT Provider Note   CSN: [id number] Arrival date & time: 01/10/18  1621     History   Chief Complaint Chief Complaint  Patient presents with  . cancer pt/pain    HPI Roger Flores is a 57 y.o. male.   57 year old male with a history of lung cancer, COPD, diabetes, hypertension, CHF, esophageal reflux, dyslipidemia presents to the emergency department for complaints of shortness of breath.  He has had worsening shortness of breath over the last few days associated with a cough and right sided pleuritic chest pain characteristic of his "cancer pain".  Patient states that he usually develops increased pain and shortness of breath when he is out of his morphine tablets.  He states that this was previously prescribed by hospice, but he is between hospice agencies.  He does have an appointment with his primary care doctor on Monday to have his morphine tablets prescribed.  He has not had any associated fevers, increased oxygen requirement, nausea, vomiting, diarrhea.      Past Medical History:  Diagnosis Date  . Anxiety   . Arthritis    knees and back  . Asthma   . Cancer ([place])    Larynx  . Cataract   . CHF (congestive heart failure) ([place])   . COPD (chronic obstructive pulmonary disease) ([place])   . Depression   . Diabetes mellitus without complication ([place])   . Emphysema of lung ([place])   . GERD (gastroesophageal reflux disease)   . Hyperlipidemia   . Hypertension   . Kidney disease   . Liver disease   . Lung mass   . Myocardial infarction ([place])   . Opioid withdrawal ([place])   . Oxygen deficiency   . Stroke ([place])   . Tobacco use     Patient Active Problem List   Diagnosis Date Noted  . Sepsis ([place]) 10/11/2017  . Cellulitis of leg, left 10/11/2017  . Chronic pain 07/24/2017  . Stroke ([place]) 07/21/2017  . History of cancer of larynx 07/21/2017  . Self-catheterizes urinary bladder 07/21/2017  . Neurogenic bladder due to  old stroke 07/21/2017  . DNR no code (do not resuscitate) 07/21/2017  . Insulin dependent diabetes mellitus ([place]) 07/21/2017  . Pressure injury of skin 07/19/2017  . Chest pain 07/18/2017  . COPD, very severe ([place]) 07/18/2017  . Unstable angina pectoris ([place]) 07/01/2017    Past Surgical History:  Procedure Laterality Date  . NECK SURGERY    . THROAT SURGERY         Home Medications    Prior to Admission medications   Medication Sig Start Date End Date Taking? Authorizing Provider  albuterol (PROVENTIL) (2.5 MG/3ML) 0.083% nebulizer solution Take 3 mLs (2.5 mg total) by nebulization every 6 (six) hours as needed for wheezing or shortness of breath. Patient taking differently: Take 2.5 mg every 4 (four) hours as needed by nebulization for wheezing or shortness of breath.  07/20/17   Kathie Dike, MD  apixaban (ELIQUIS) 5 MG TABS tablet Take 1 tablet (5 mg total) by mouth 2 (two) times daily. 07/04/17   Jola Schmidt, MD  docusate sodium (COLACE) 100 MG capsule Take 100 mg by mouth 2 (two) times daily as needed for constipation.    [provider]  doxycycline (VIBRA-TABS) 100 MG tablet Take 1 tablet (100 mg total) 2 (two) times daily by mouth. 10/10/17   Francine Graven, DO  feeding supplement, GLUCERNA SHAKE, (GLUCERNA SHAKE) LIQD Take 237 mLs 5 (  five) times daily by mouth.     [provider]  furosemide (LASIX) 40 MG tablet Take 60 mg daily by mouth.     [provider]  ibuprofen (ADVIL,MOTRIN) 800 MG tablet Take 800 mg by mouth every 8 (eight) hours as needed for moderate pain.    [provider]  insulin NPH Human (HUMULIN N,NOVOLIN N) 100 UNIT/ML injection Inject 8 Units 3 (three) times daily before meals into the skin.    [provider]  insulin regular (NOVOLIN R,HUMULIN R) 100 units/mL injection Inject 18 Units at bedtime into the skin.    [provider]  LORazepam (ATIVAN) 0.5 MG tablet Take 1 tablet (0.5 mg total) by  mouth every 8 (eight) hours as needed for anxiety. 07/22/17   Duffy Bruce, MD  Melatonin 3 MG TABS Take 3 mg at bedtime by mouth.    [provider]  morphine (MS CONTIN) 60 MG 12 hr tablet Take 1 tablet (60 mg total) by mouth every 12 (twelve) hours. 01/11/18   Antonietta Breach, PA-C  morphine (MSIR) 15 MG tablet Take 2 tablets (30 mg total) by mouth every 8 (eight) hours as needed for severe pain. 01/11/18   Antonietta Breach, PA-C  morphine (ROXANOL) 20 MG/ML concentrated solution Take 20 mg every 6 (six) hours as needed by mouth for breakthrough pain.    [provider]  nitroGLYCERIN (NITROSTAT) 0.4 MG SL tablet Place 1 tablet under the tongue daily as needed for chest pain. 05/23/17   [provider]  ondansetron (ZOFRAN) 4 MG tablet Take 1 tablet (4 mg total) by mouth every 8 (eight) hours as needed for nausea or vomiting. 08/29/17   Mesner, Corene Cornea, MD  OXYGEN Inhale 3 L continuous into the lungs. At home     [provider]  predniSONE (DELTASONE) 20 MG tablet Take 2 tablets (40 mg total) daily by mouth. 10/10/17   Francine Graven, DO  prochlorperazine (COMPAZINE) 5 MG tablet Take 5 mg by mouth every 6 (six) hours as needed for nausea or vomiting.    [provider]  tamsulosin (FLOMAX) 0.4 MG CAPS capsule Take 0.4 mg daily after breakfast by mouth.     [provider]    Family History Family History  Problem Relation Age of Onset  . Arthritis Mother   . Asthma Mother   . COPD Mother   . Depression Mother   . Hyperlipidemia Mother   . Hypertension Mother   . Miscarriages / [place] Mother   . Alzheimer's disease Mother   . Alcohol abuse Father   . Drug abuse Father   . Early death Father 16       gunshot  . Heart disease Brother 67  . Early death Son        SIDS  . Early death Son        MVA    Social History Social History   Tobacco Use  . Smoking status: Current Every Day Smoker    Packs/day: 1.00    Types: Cigarettes     Start date: 11/25/1977  . Smokeless tobacco: Never Used  Substance Use Topics  . Alcohol use: No  . Drug use: No     Allergies   Patient has no known allergies.   Review of Systems Review of Systems Ten systems reviewed and are negative for acute change, except as noted in the HPI.    Physical Exam Updated Vital Signs BP 132/89   Pulse 80  Resp 19   SpO2 92%   Physical Exam  Constitutional: He is oriented to person, place, and time. He appears well-developed and well-nourished. No distress.  Patient in NAD  HENT:  Head: Normocephalic and atraumatic.  Eyes: Conjunctivae and EOM are normal. No scleral icterus.  Neck: Normal range of motion.  Cardiovascular: Normal rate, regular rhythm and intact distal pulses.  Pulmonary/Chest: Effort normal. No respiratory distress. He has wheezes (diffuse expiratory). He has no rales.  SpO2 98% on chronic 3L via [place]  Abdominal: Soft. He exhibits no distension. There is no tenderness. There is no guarding.  Soft, obese, nontender  Musculoskeletal: Normal range of motion.  Neurological: He is alert and oriented to person, place, and time. He exhibits normal muscle tone. Coordination normal.  Skin: Skin is warm and dry. No rash noted. He is not diaphoretic. No erythema. No pallor.  Psychiatric: He has a normal mood and affect. His behavior is normal.  Nursing note and vitals reviewed.    ED Treatments / Results  Labs (all labs ordered are listed, but only abnormal results are displayed) Labs Reviewed  CBG MONITORING, ED - Abnormal; Notable for the following components:      Result Value   Glucose-Capillary 227 (*)    All other components within normal limits    EKG  EKG Interpretation None       Radiology Dg Chest 2 View  Result Date: 01/10/2018 CLINICAL DATA:  Chronic pain. EXAM: CHEST  2 VIEW COMPARISON:  Chest x-ray dated December 15, 2017. FINDINGS: The heart size and mediastinal contours are within normal limits.  Both lungs are clear. The visualized skeletal structures are unremarkable. IMPRESSION: No active cardiopulmonary disease. Electronically Signed   By: Titus Dubin M.D.   On: 01/10/2018 17:03    Procedures Procedures (including critical care time)  Medications Ordered in ED Medications  morphine (MS CONTIN) 12 hr tablet 60 mg (60 mg Oral Given 01/11/18 0001)  albuterol (PROVENTIL) (2.5 MG/3ML) 0.083% nebulizer solution 5 mg (5 mg Nebulization Given 01/10/18 2250)  ipratropium (ATROVENT) nebulizer solution 0.5 mg (0.5 mg Nebulization Given 01/10/18 2250)  methylPREDNISolone sodium succinate (SOLU-MEDROL) 125 mg/2 mL injection 80 mg (80 mg Intramuscular Given 01/10/18 2253)  morphine 4 MG/ML injection 4 mg (4 mg Intramuscular Given 01/10/18 2254)    12:26 AM Lungs clear to auscultation bilaterally on repeat assessment.  Initial Impression / Assessment and Plan / ED Course  I have reviewed the triage vital signs and the nursing notes.  Pertinent labs & imaging results that were available during my care of the patient were reviewed by me and considered in my medical decision making (see chart for details).     56 year old male with a history of lung cancer, between hospice agencies, presents for shortness of breath and pleuritic pain consistent with known lung cancer pain.  Pain is present to the right lower chest wall.  He is on chronic Eliquis for management of prior VTE.  He has a history of chronic opioid dependence as per [facility] substance database which was reviewed.  Patient with stable oxygen saturations on his chronic 3 L via nasal cannula.  He did initially have some wheezing in all lung fields.  This resolved following a DuoNeb treatment.  Patient also given 1 dose of Solu-Medrol to prevent worsening exacerbation.  Patient given his long-acting morphine in the emergency department in addition to a muscular dose of 4 mg morphine.  He is requesting a refill of his narcotic  prescriptions until he is able to follow-up with his doctor on Monday.  Patient provided prescriptions based on prescribing history.  He was noted to be prescribed morphine ER 60 mg BID with 30 mg morphine IR TID at the end of January.  He has been given only enough medication to tide him to his PCP appointment.  Return precautions discussed and provided. Patient discharged in stable condition with no unaddressed concerns.   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

## 2018-01-10 NOTE — ED Notes (Signed)
CBG 227. Deneise Lever, RN notified

## 2018-01-10 NOTE — ED Triage Notes (Addendum)
Patient complains of chronic lung pain related to cancer. States that he is in between hospice agencies and states out of pain meds. Arrived on 3l of oxygen. Patient alert and oriented, no acute change. Reports that he has had increased cough

## 2018-01-11 MED ORDER — MORPHINE SULFATE ER 60 MG PO TBCR: 60.0000 mg | EXTENDED_RELEASE_TABLET | Freq: Two times a day (BID) | ORAL | 0 refills | Status: DC

## 2018-01-11 MED ORDER — MORPHINE SULFATE 15 MG PO TABS: 30.0000 mg | ORAL_TABLET | Freq: Three times a day (TID) | ORAL | 0 refills | Status: DC | PRN

## 2018-01-11 NOTE — Discharge Instructions (Signed)
Your prescriptions were reviewed on the [place] substance database.  You were prescribed pain medication based on your historical prescriptions.  We recommend follow-up with your primary care doctor on Monday as scheduled.  For future visits, the emergency department is not the appropriate place for the management of chronic pain.  You may return for other new or concerning symptoms.

## 2018-01-12 IMAGING — DX DG CHEST 2V
2 series · 2 of 2 positions shown · non-contrast
Comparison: July 26, 2017

CLINICAL DATA: Mid chest pain radiating to the left jaw.

EXAM:
CHEST  2 VIEW

[chest pa]
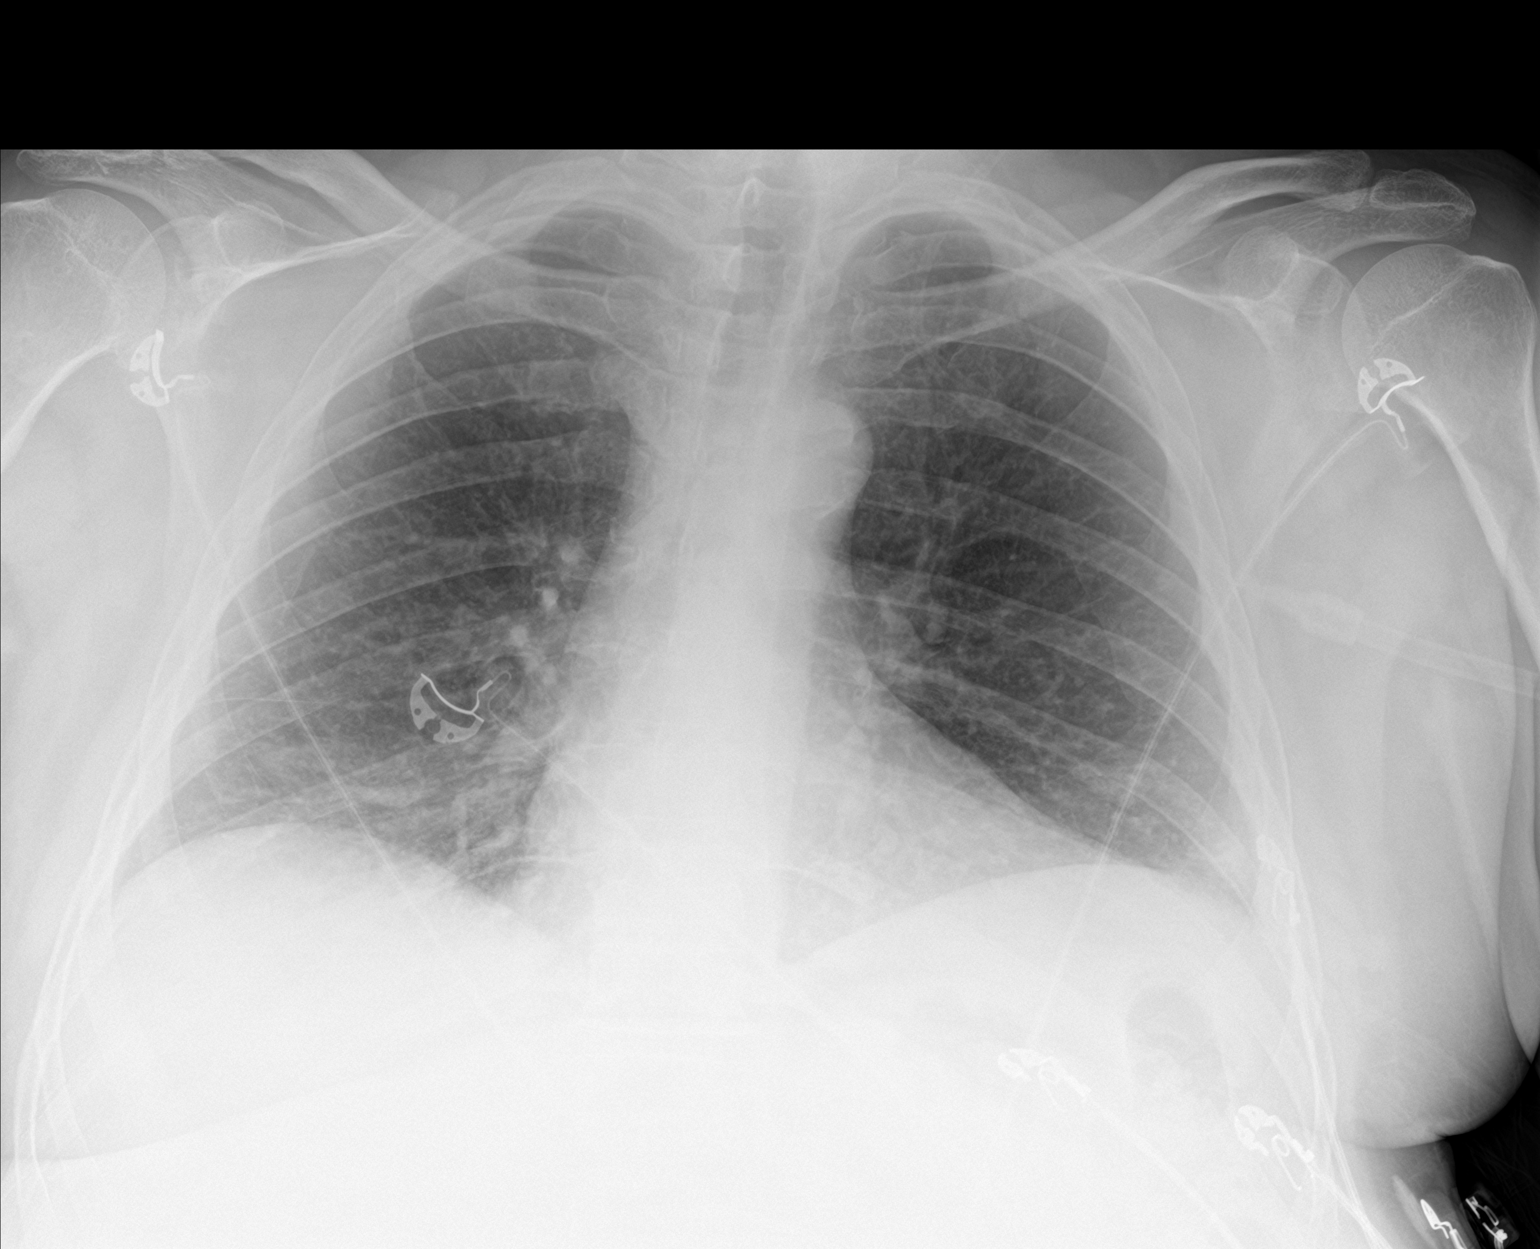

[chest lat]
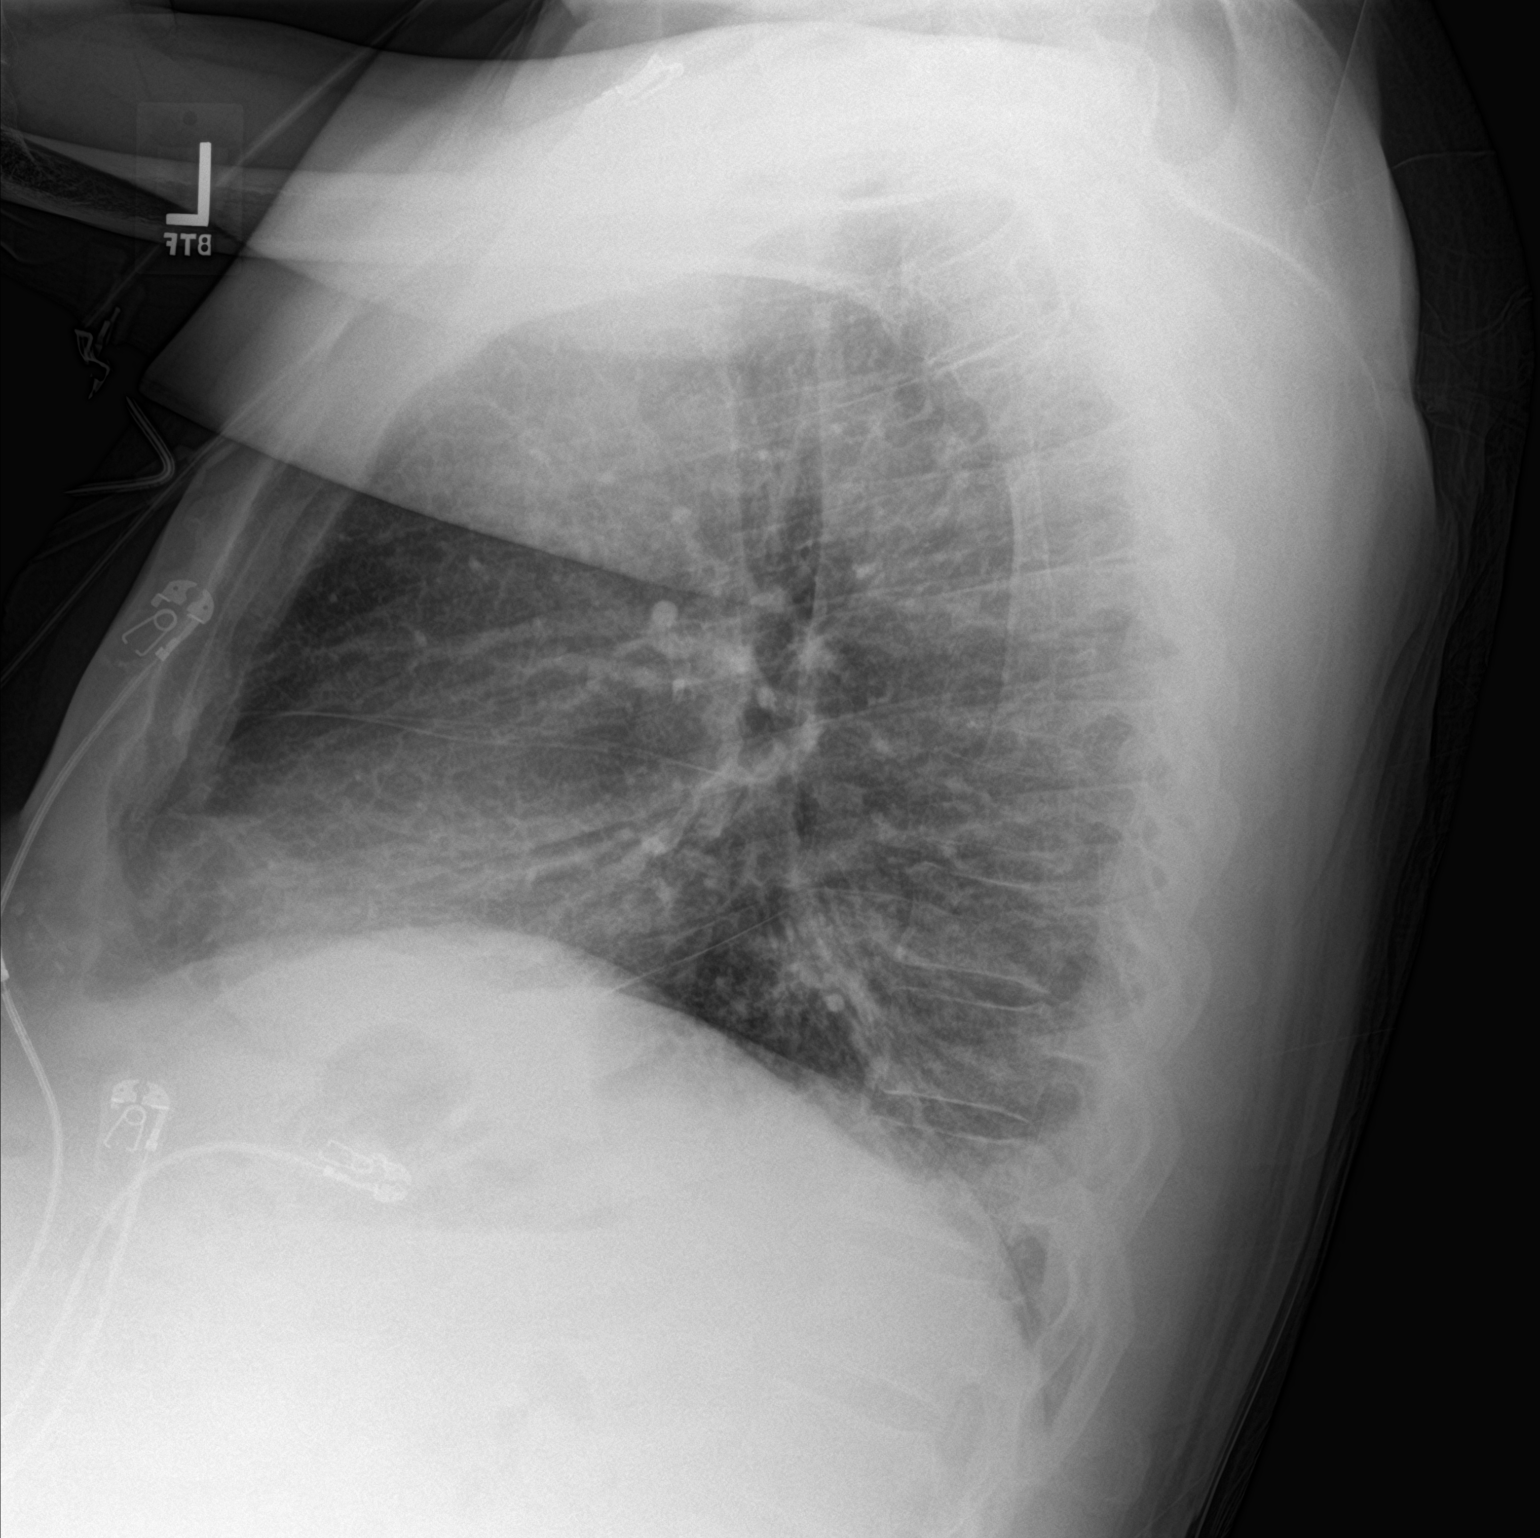

[2 of 2 positions shown; findings below may reference images not displayed]

FINDINGS: The heart size and mediastinal contours are within normal limits.
There is mild increased pulmonary interstitium bilaterally. No focal
pneumonia is identified. There is probable minimal posterior pleural
effusion. The visualized skeletal structures are unremarkable.
IMPRESSION: Mild increased pulmonary interstitium bilaterally which could be due
to mild interstitial edema.

## 2018-01-18 IMAGING — CR DG CHEST 2V
2 series · 2 of 2 positions shown · non-contrast
Comparison: None

CLINICAL DATA: Shortness of breath, hurts to breathe, history of
pulmonary embolism, COPD, diabetes mellitus, CHF, nodules in lungs,
laryngeal cancer

EXAM:
CHEST  2 VIEW

[chest pa]
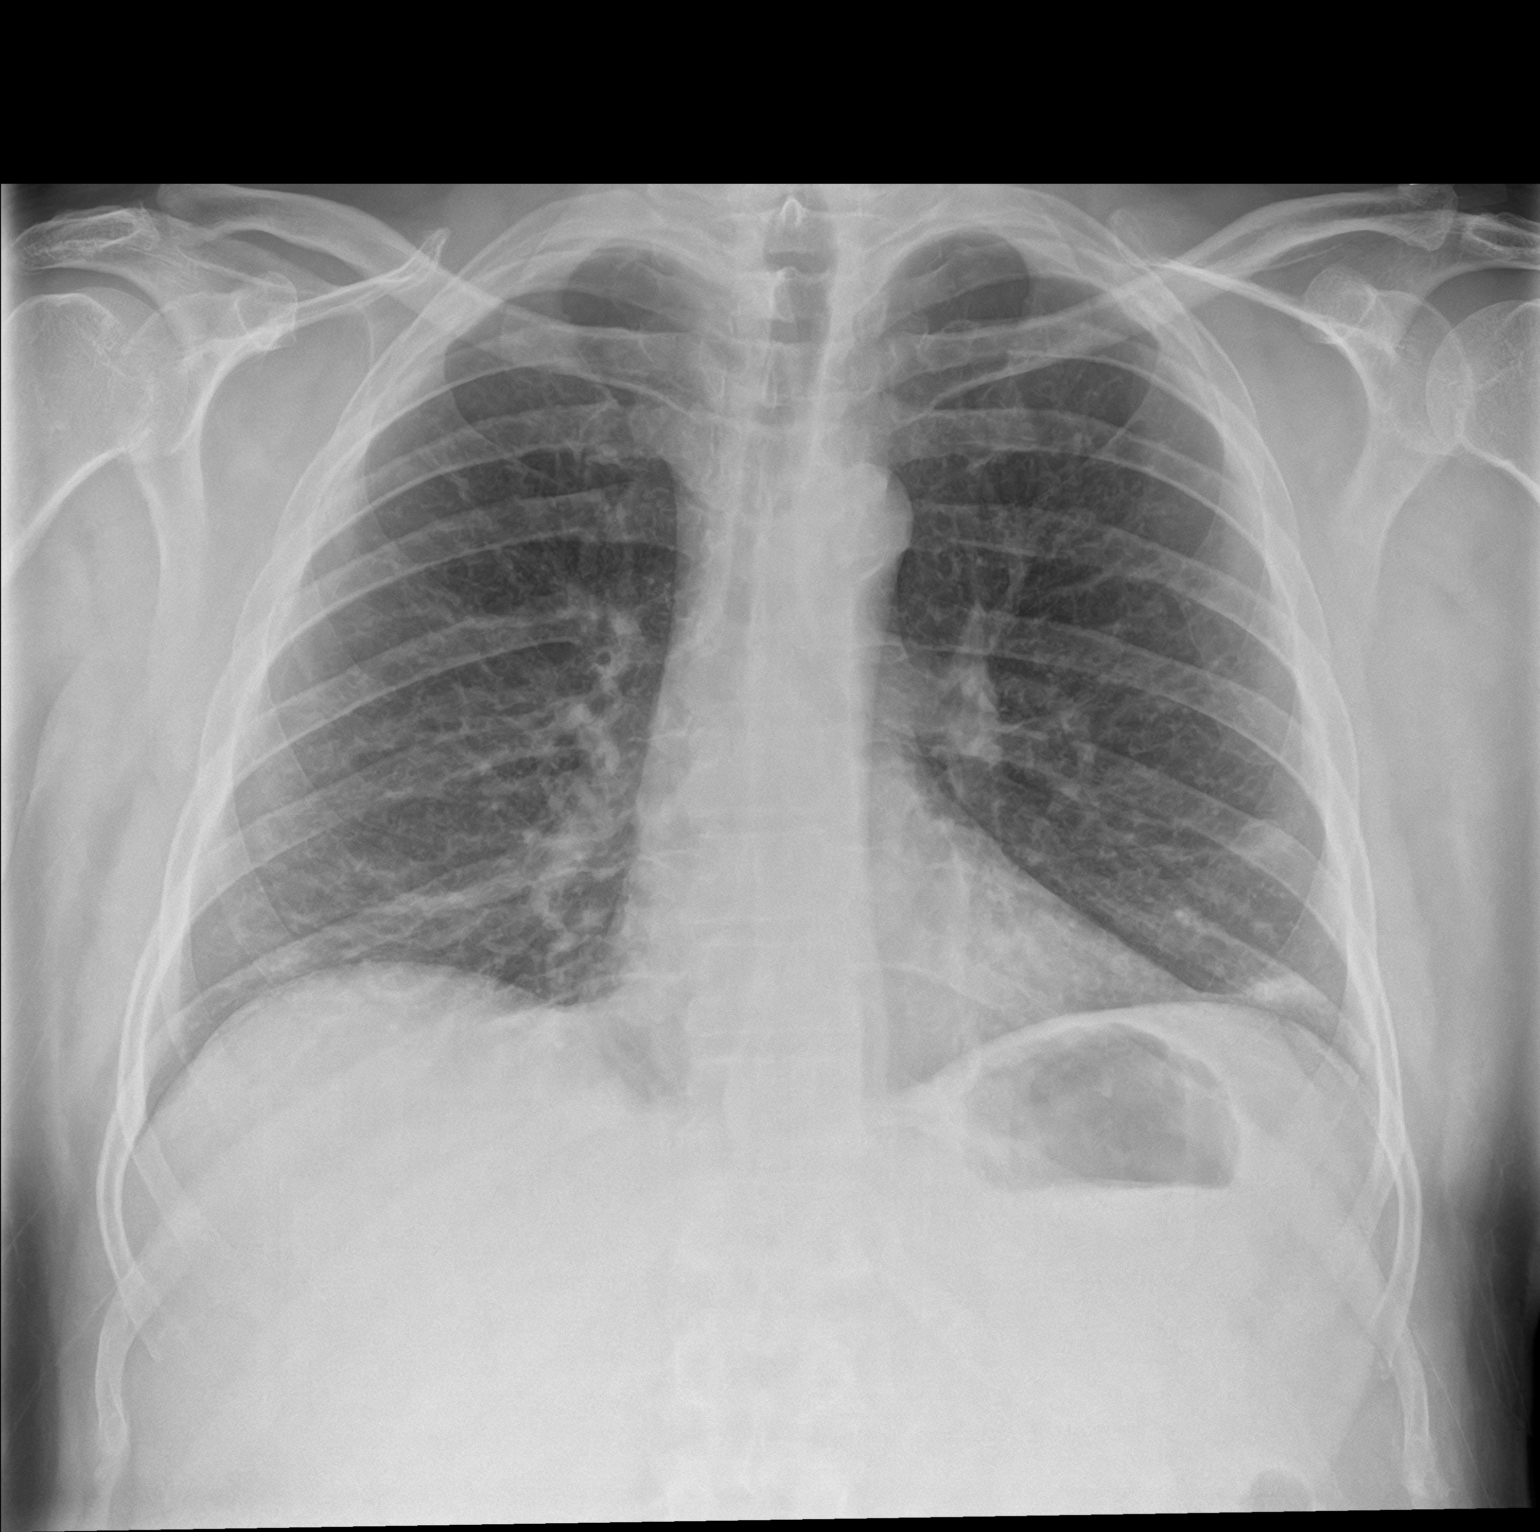

[chest lat]
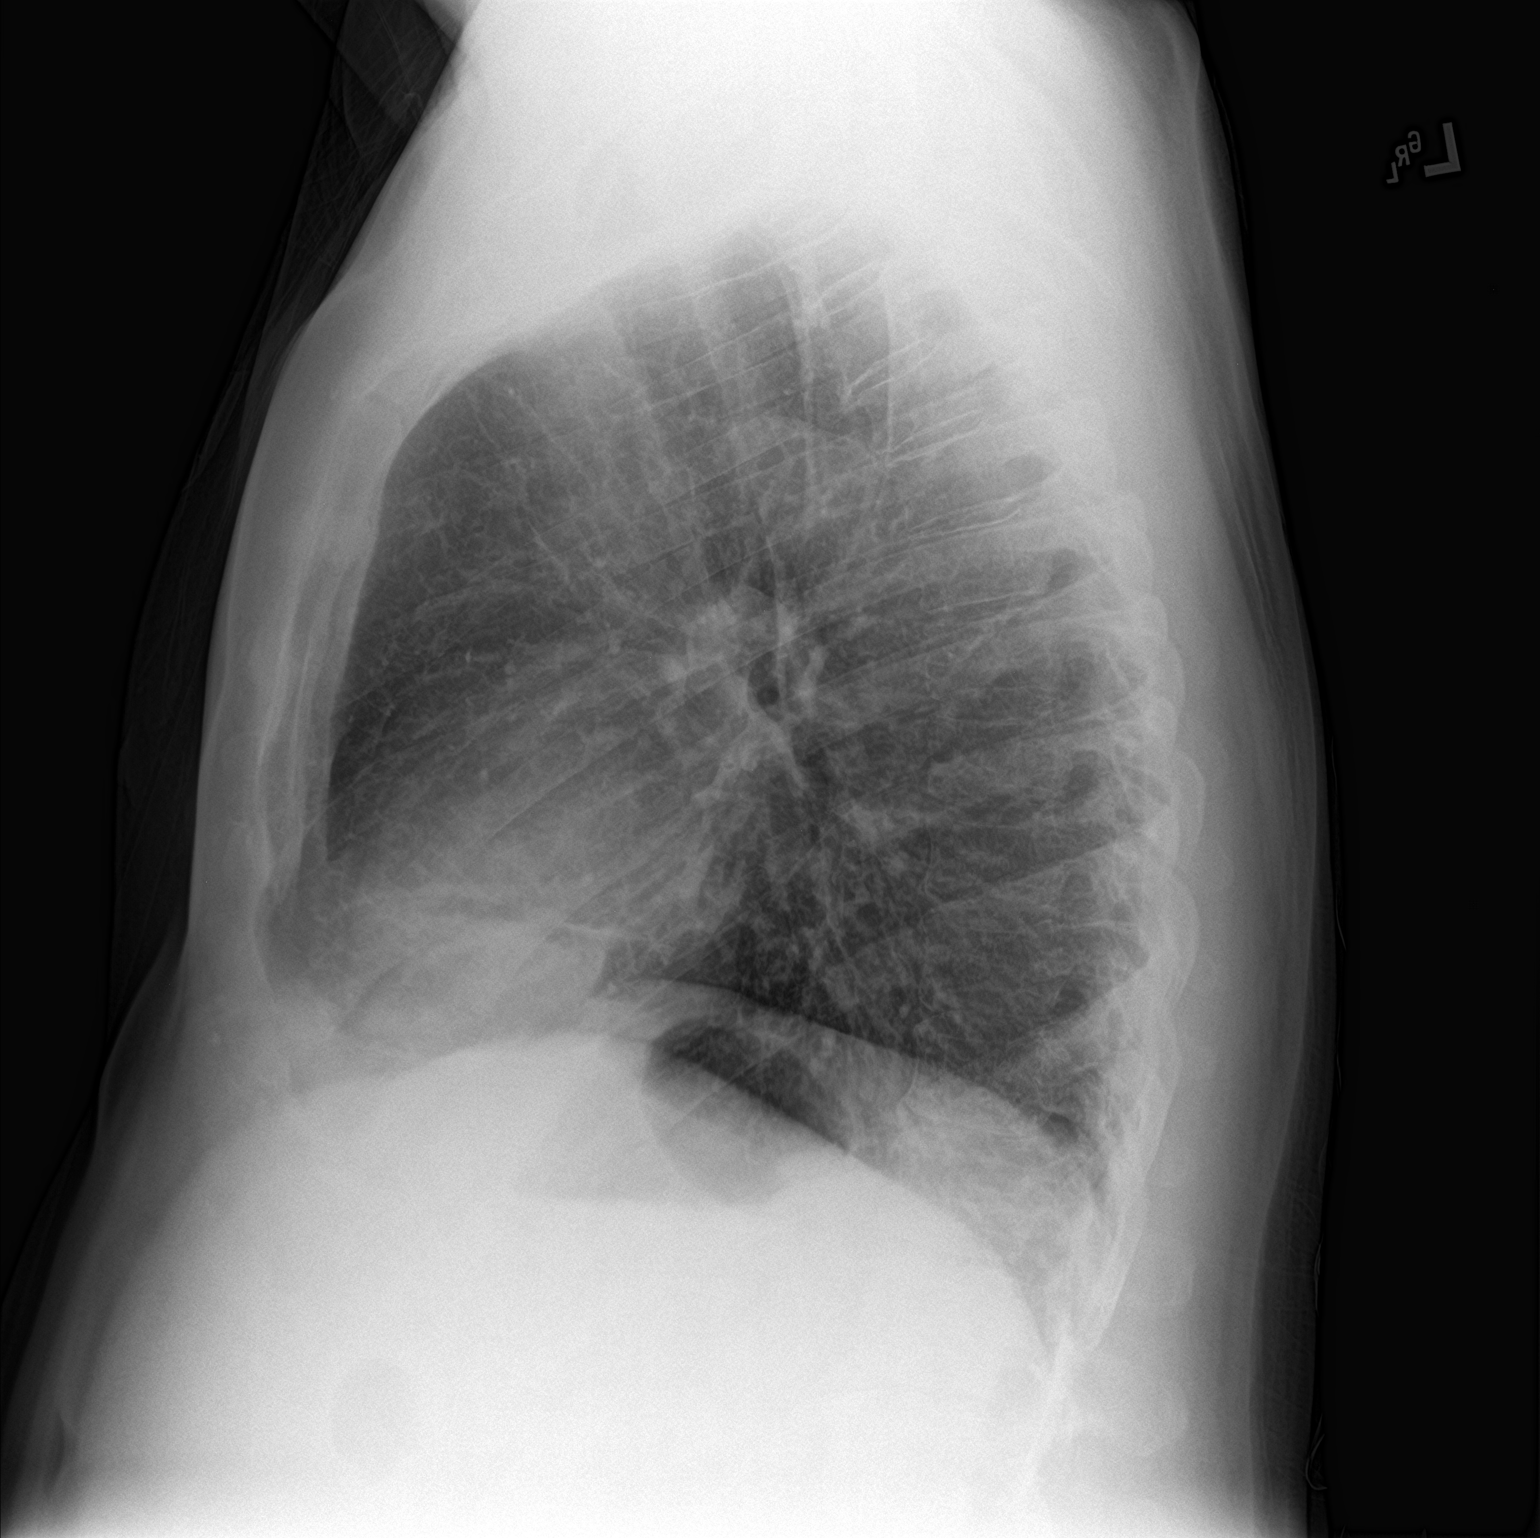

[2 of 2 positions shown; findings below may reference images not displayed]

FINDINGS: Normal heart size, mediastinal contours, and pulmonary vascularity.

Atherosclerotic calcification aorta.

Central peribronchial thickening.

Minimal RIGHT basilar atelectasis.

Additional opacities at the LEFT base may represent atelectasis as
well though cannot completely exclude nodular density at LEFT base
15 x 9 mm diameter.

Upper lungs clear.

No definite acute infiltrate, pleural effusion or pneumothorax.

Bones unremarkable.
IMPRESSION: Bronchitic changes with bibasilar atelectasis.

Cannot exclude a 15 x 9 mm diameter nodule at LEFT base though this
could be related atelectasis; correlation with prior outside imaging
would be of benefit in establishing stability of this finding.

In the absence of previous exams, recommend CT chest to exclude
pulmonary nodule.

## 2018-01-24 ENCOUNTER — Emergency Department (HOSPITAL_COMMUNITY)
Admission: EM | Admit: 2018-01-24 | Discharge: 2018-01-24 | Disposition: A | Discharge status: Home / Self Care | Payer: Medicare Other | Attending: Emergency Medicine | Admitting: Emergency Medicine

## 2018-01-24 ENCOUNTER — Encounter (HOSPITAL_COMMUNITY): Payer: Self-pay | Admitting: *Deleted

## 2018-01-24 ENCOUNTER — Emergency Department (HOSPITAL_COMMUNITY): Payer: Medicare Other

## 2018-01-24 DIAGNOSIS — Z7901 Long term (current) use of anticoagulants: Secondary | ICD-10-CM | POA: Insufficient documentation

## 2018-01-24 DIAGNOSIS — Z8521 Personal history of malignant neoplasm of larynx: Secondary | ICD-10-CM | POA: Diagnosis not present

## 2018-01-24 DIAGNOSIS — Z8673 Personal history of transient ischemic attack (TIA), and cerebral infarction without residual deficits: Secondary | ICD-10-CM | POA: Insufficient documentation

## 2018-01-24 DIAGNOSIS — J45909 Unspecified asthma, uncomplicated: Secondary | ICD-10-CM | POA: Insufficient documentation

## 2018-01-24 DIAGNOSIS — E119 Type 2 diabetes mellitus without complications: Secondary | ICD-10-CM | POA: Insufficient documentation

## 2018-01-24 DIAGNOSIS — F17210 Nicotine dependence, cigarettes, uncomplicated: Secondary | ICD-10-CM | POA: Diagnosis not present

## 2018-01-24 DIAGNOSIS — I509 Heart failure, unspecified: Secondary | ICD-10-CM | POA: Insufficient documentation

## 2018-01-24 DIAGNOSIS — F329 Major depressive disorder, single episode, unspecified: Secondary | ICD-10-CM | POA: Insufficient documentation

## 2018-01-24 DIAGNOSIS — Z79899 Other long term (current) drug therapy: Secondary | ICD-10-CM | POA: Diagnosis not present

## 2018-01-24 DIAGNOSIS — R0989 Other specified symptoms and signs involving the circulatory and respiratory systems: Secondary | ICD-10-CM | POA: Insufficient documentation

## 2018-01-24 DIAGNOSIS — I110 Hypertensive heart disease with heart failure: Secondary | ICD-10-CM | POA: Insufficient documentation

## 2018-01-24 DIAGNOSIS — I252 Old myocardial infarction: Secondary | ICD-10-CM | POA: Diagnosis not present

## 2018-01-24 DIAGNOSIS — Z794 Long term (current) use of insulin: Secondary | ICD-10-CM | POA: Insufficient documentation

## 2018-01-24 DIAGNOSIS — J449 Chronic obstructive pulmonary disease, unspecified: Secondary | ICD-10-CM | POA: Insufficient documentation

## 2018-01-24 DIAGNOSIS — F419 Anxiety disorder, unspecified: Secondary | ICD-10-CM | POA: Insufficient documentation

## 2018-01-24 DIAGNOSIS — R0600 Dyspnea, unspecified: Secondary | ICD-10-CM | POA: Diagnosis present

## 2018-01-24 LAB — COMPREHENSIVE METABOLIC PANEL
ALT: 90 U/L — ABNORMAL HIGH (ref 17–63)
AST: 71 U/L — AB (ref 15–41)
Albumin: 3.4 g/dL — ABNORMAL LOW (ref 3.5–5.0)
Alkaline Phosphatase: 59 U/L (ref 38–126)
Anion gap: 11 (ref 5–15)
BUN: 10 mg/dL (ref 6–20)
CO2: 23 mmol/L (ref 22–32)
CREATININE: 0.86 mg/dL (ref 0.61–1.24)
Calcium: 8.8 mg/dL — ABNORMAL LOW (ref 8.9–10.3)
Chloride: 100 mmol/L — ABNORMAL LOW (ref 101–111)
GLUCOSE: 279 mg/dL — AB (ref 65–99)
POTASSIUM: 5.3 mmol/L — AB (ref 3.5–5.1)
Sodium: 134 mmol/L — ABNORMAL LOW (ref 135–145)
Total Bilirubin: 1.3 mg/dL — ABNORMAL HIGH (ref 0.3–1.2)
Total Protein: 7.3 g/dL (ref 6.5–8.1)

## 2018-01-24 LAB — URINALYSIS, ROUTINE W REFLEX MICROSCOPIC
Bacteria, UA: NONE SEEN
Bilirubin Urine: NEGATIVE
Glucose, UA: >=500 mg/dL — AB
HGB URINE DIPSTICK: NEGATIVE
Ketones, ur: NEGATIVE mg/dL
Leukocytes, UA: NEGATIVE
Nitrite: NEGATIVE
PH: 6 (ref 5.0–8.0)
Protein, ur: NEGATIVE mg/dL
SPECIFIC GRAVITY, URINE: 1.028 (ref 1.005–1.030)
Squamous Epithelial / LPF: NONE SEEN
WBC UA: NONE SEEN WBC/hpf (ref 0–5)

## 2018-01-24 LAB — CBC WITH DIFFERENTIAL/PLATELET
BASOS PCT: 0 %
Basophils Absolute: 0 10*3/uL (ref 0.0–0.1)
Eosinophils Absolute: 0.3 10*3/uL (ref 0.0–0.7)
Eosinophils Relative: 3 %
HCT: 50.7 % (ref 39.0–52.0)
HEMOGLOBIN: 17.3 g/dL — AB (ref 13.0–17.0)
LYMPHS ABS: 4.4 10*3/uL — AB (ref 0.7–4.0)
Lymphocytes Relative: 40 %
MCH: 29.7 pg (ref 26.0–34.0)
MCHC: 34.1 g/dL (ref 30.0–36.0)
MCV: 87.1 fL (ref 78.0–100.0)
MONO ABS: 1.2 10*3/uL — AB (ref 0.1–1.0)
Monocytes Relative: 11 %
NEUTROS ABS: 5.2 10*3/uL (ref 1.7–7.7)
Neutrophils Relative %: 46 %
PLATELETS: 274 10*3/uL (ref 150–400)
RBC: 5.82 MIL/uL — ABNORMAL HIGH (ref 4.22–5.81)
RDW: 15.1 % (ref 11.5–15.5)
WBC: 11.1 10*3/uL — ABNORMAL HIGH (ref 4.0–10.5)

## 2018-01-24 LAB — I-STAT CG4 LACTIC ACID, ED
LACTIC ACID, VENOUS: 2.3 mmol/L — AB (ref 0.5–1.9)
Lactic Acid, Venous: 1.63 mmol/L (ref 0.5–1.9)

## 2018-01-24 LAB — POTASSIUM: Potassium: 4.6 mmol/L (ref 3.5–5.1)

## 2018-01-24 MED ORDER — MORPHINE SULFATE (PF) 4 MG/ML IV SOLN
4.0000 mg | Freq: Once | INTRAVENOUS | Status: AC
  Administered 2018-01-24: 4 mg via INTRAVENOUS
  Filled 2018-01-24: qty 1

## 2018-01-24 MED ORDER — SODIUM CHLORIDE 0.9 % IV BOLUS (SEPSIS)
500.0000 mL | Freq: Once | INTRAVENOUS | Status: AC
  Administered 2018-01-24: 500 mL via INTRAVENOUS

## 2018-01-24 NOTE — ED Triage Notes (Signed)
Pt in c/o choking sensation, pt reports once being on hospice d/t end stage COPD, pt reports feeding tube being removed x 9 mths ago, pt reports trying to eat food today and choked, pt c/o lungs hurting now, pt on 3L [place] at home PRN, pt requesting urinary cath, pt states, "I am tired of cathing myself four times a day." pt has labored breathing in triage, pt follows commands, A&O x4

## 2018-01-24 NOTE — Discharge Instructions (Signed)
Please take oral only as you have been instructed Return if you have symptoms of infection such as fever, increased sputum production, or worsening shortness of breath. Please follow-up with your primary care doctor as soon as possible

## 2018-01-24 NOTE — ED Provider Notes (Signed)
Roger Flores Provider Note   CSN: [id number] Arrival date & time: 01/24/18  1353     History   Chief Complaint Chief Complaint  Roger Flores presents with  . Shortness of Breath    HPI Roger Flores is a 57 y.o. male.  HPI  57 yo male ho cva, copd states he thinks he aspirated this morning.  Coughed up some food.  Since then he has right flank pain with increased dyspnea from baselinel  Roger Flores on 3 l/m at home.  Denies fever, couugh productive of white mucouus.  Some blood stains but states that happpens regularly.  Roger Flores walks with walker withresidual lef tside weakness.  Liver cirrhosis, chf , mi, states was on hospice until 3 weeks ago, states he was going to have insulin taken away and came off.  Roger Flores states has been hospice 1 year and 4 months.  Lives at home with wife. Roger Flores now states that the right flank pain/lung pain is his chronic pain but worse now  Past Medical History:  Diagnosis Date  . Anxiety   . Arthritis    knees and back  . Asthma   . Cancer ([place])    Larynx  . Cataract   . CHF (congestive heart failure) ([place])   . COPD (chronic obstructive pulmonary disease) ([place])   . Depression   . Diabetes mellitus without complication ([place])   . Emphysema of lung ([place])   . GERD (gastroesophageal reflux disease)   . Hyperlipidemia   . Hypertension   . Kidney disease   . Liver disease   . Lung mass   . Myocardial infarction ([place])   . Opioid withdrawal ([place])   . Oxygen deficiency   . Stroke ([place])   . Tobacco use     Roger Flores Active Problem List   Diagnosis Date Noted  . Sepsis ([place]) 10/11/2017  . Cellulitis of leg, left 10/11/2017  . Chronic pain 07/24/2017  . Stroke ([place]) 07/21/2017  . History of cancer of larynx 07/21/2017  . Self-catheterizes urinary bladder 07/21/2017  . 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 DNR no code (do not resuscitate) 07/21/2017  . Insulin dependent diabetes mellitus ([place])  07/21/2017  . Pressure injury of skin 07/19/2017  . Chest pain 07/18/2017  . COPD, very severe ([place]) 07/18/2017  . Unstable angina pectoris ([place]) 07/01/2017    Past Surgical History:  Procedure Laterality Date  . NECK SURGERY    . THROAT SURGERY         Home Medications    Prior to Admission medications   Medication Sig Start Date End Date Taking? Authorizing Provider  albuterol (PROVENTIL) (2.5 MG/3ML) 0.083% nebulizer solution Take 3 mLs (2.5 mg total) by nebulization every 6 (six) hours as needed for wheezing or shortness of breath. Roger Flores taking differently: Take 2.5 mg every 4 (four) hours as needed by nebulization for wheezing or shortness of breath.  07/20/17   Kathie Dike, MD  apixaban (ELIQUIS) 5 MG TABS tablet Take 1 tablet (5 mg total) by mouth 2 (two) times daily. 07/04/17   Jola Schmidt, MD  docusate sodium (COLACE) 100 MG capsule Take 100 mg by mouth 2 (two) times daily as needed for constipation.    [provider]  doxycycline (VIBRA-TABS) 100 MG tablet Take 1 tablet (100 mg total) 2 (two) times daily by mouth. 10/10/17   Francine Graven, DO  feeding supplement, GLUCERNA SHAKE, (GLUCERNA SHAKE) LIQD Take 237 mLs 5 (five) times daily  by mouth.     [provider]  furosemide (LASIX) 40 MG tablet Take 60 mg daily by mouth.     [provider]  ibuprofen (ADVIL,MOTRIN) 800 MG tablet Take 800 mg by mouth every 8 (eight) hours as needed for moderate pain.    [provider]  insulin NPH Human (HUMULIN N,NOVOLIN N) 100 UNIT/ML injection Inject 8 Units 3 (three) times daily before meals into the skin.    [provider]  insulin regular (NOVOLIN R,HUMULIN R) 100 units/mL injection Inject 18 Units at bedtime into the skin.    [provider]  LORazepam (ATIVAN) 0.5 MG tablet Take 1 tablet (0.5 mg total) by mouth every 8 (eight) hours as needed for anxiety. 07/22/17   Duffy Bruce, MD  Melatonin 3 MG TABS Take 3 mg at  bedtime by mouth.    [provider]  morphine (MS CONTIN) 60 MG 12 hr tablet Take 1 tablet (60 mg total) by mouth every 12 (twelve) hours. 01/11/18   Antonietta Breach, PA-C  morphine (MSIR) 15 MG tablet Take 2 tablets (30 mg total) by mouth every 8 (eight) hours as needed for severe pain. 01/11/18   Antonietta Breach, PA-C  morphine (ROXANOL) 20 MG/ML concentrated solution Take 20 mg every 6 (six) hours as needed by mouth for breakthrough pain.    [provider]  nitroGLYCERIN (NITROSTAT) 0.4 MG SL tablet Place 1 tablet under the tongue daily as needed for chest pain. 05/23/17   [provider]  ondansetron (ZOFRAN) 4 MG tablet Take 1 tablet (4 mg total) by mouth every 8 (eight) hours as needed for nausea or vomiting. 08/29/17   Mesner, Corene Cornea, MD  OXYGEN Inhale 3 L continuous into the lungs. At home     [provider]  predniSONE (DELTASONE) 20 MG tablet Take 2 tablets (40 mg total) daily by mouth. 10/10/17   Francine Graven, DO  prochlorperazine (COMPAZINE) 5 MG tablet Take 5 mg by mouth every 6 (six) hours as needed for nausea or vomiting.    [provider]  tamsulosin (FLOMAX) 0.4 MG CAPS capsule Take 0.4 mg daily after breakfast by mouth.     [provider]    Family History Family History  Problem Relation Age of Onset  . Arthritis Mother   . Asthma Mother   . COPD Mother   . Depression Mother   . Hyperlipidemia Mother   . Hypertension Mother   . Miscarriages / [place] Mother   . Alzheimer's disease Mother   . Alcohol abuse Father   . Drug abuse Father   . Early death Father 39       gunshot  . Heart disease Brother 24  . Early death Son        SIDS  . Early death Son        MVA    Social History Social History   Tobacco Use  . Smoking status: Current Every Day Smoker    Packs/day: 1.00    Types: Cigarettes    Start date: 11/25/1977  . Smokeless tobacco: Never Used  Substance Use Topics  . Alcohol use: No  . Drug use:  No     Allergies   Roger Flores has no known allergies.   Review of Systems Review of Systems  Constitutional: Negative for chills and fever.  HENT: Positive for rhinorrhea and trouble swallowing. Negative for congestion, dental problem, sinus pressure and sore throat.   Eyes: Positive for visual disturbance.  Respiratory: Positive for cough, choking and shortness of breath.   Cardiovascular: Positive for chest pain.  Gastrointestinal: Negative.   Endocrine: Negative.   Genitourinary: Positive for difficulty urinating.       Roger Flores self caths from stroke  Musculoskeletal: Negative.   Skin: Negative.   Allergic/Immunologic: Negative.   Neurological: Negative.   Hematological: Negative.   Psychiatric/Behavioral: Negative.   All other systems reviewed and are negative.    Physical Exam Updated Vital Signs BP (!) 143/84 (BP Location: Right Arm)   Pulse 99   Temp 97.8 F (36.6 C) (Oral)   Resp 16   SpO2 100%   Physical Exam  Constitutional: He appears well-developed and well-nourished.  HENT:  Head: Normocephalic and atraumatic.  Mouth/Throat: Oropharynx is clear and moist.  Eyes: EOM are normal. Pupils are equal, round, and reactive to light.  Neck: Normal range of motion.  Cardiovascular: Normal rate.  Pulmonary/Chest: Effort normal and breath sounds normal.  Abdominal: Soft. Bowel sounds are normal.  Musculoskeletal:       Right lower leg: Normal.       Left lower leg: Normal.  Neurological: He is alert.  Skin: Skin is warm and dry. Capillary refill takes less than 2 seconds.  Psychiatric: He has a normal mood and affect.  Nursing note and vitals reviewed.    ED Treatments / Results  Labs (all labs ordered are listed, but only abnormal results are displayed) Labs Reviewed  COMPREHENSIVE METABOLIC PANEL - Abnormal; Notable for the following components:      Result Value   Sodium 134 (*)    Potassium 5.3 (*)    Chloride 100 (*)    Glucose, Bld 279 (*)     Calcium 8.8 (*)    Albumin 3.4 (*)    AST 71 (*)    ALT 90 (*)    Total Bilirubin 1.3 (*)    All other components within normal limits  CBC WITH DIFFERENTIAL/PLATELET - Abnormal; Notable for the following components:   WBC 11.1 (*)    RBC 5.82 (*)    Hemoglobin 17.3 (*)    Lymphs Abs 4.4 (*)    Monocytes Absolute 1.2 (*)    All other components within normal limits  I-STAT CG4 LACTIC ACID, ED - Abnormal; Notable for the following components:   Lactic Acid, Venous 2.30 (*)    All other components within normal limits  URINALYSIS, ROUTINE W REFLEX MICROSCOPIC  I-STAT CG4 LACTIC ACID, ED    EKG  EKG Interpretation None       Radiology Dg Chest 2 View  Result Date: 01/24/2018 CLINICAL DATA:  Shortness of breath, right-sided chest pain EXAM: CHEST  2 VIEW COMPARISON:  None. FINDINGS: The heart size and mediastinal contours are within normal limits. Both lungs are clear. The visualized skeletal structures are unremarkable. IMPRESSION: No active cardiopulmonary disease. Electronically Signed   By: Kathreen Devoid   On: 01/24/2018 15:15    Procedures Procedures (including critical care time)  Medications Ordered in ED Medications - No data to display   Initial Impression / Assessment and Plan / ED Course  I have reviewed the triage vital signs and the nursing notes.  Pertinent labs & imaging results that were available during my care of the Roger Flores were reviewed by me and considered in my medical decision making (see chart for details).     Data base-2/25 receved 7 day supply of 50 mg ms xr From Dr. Lavada Mesi   Roger Flores presents  stating that he thinks he may have aspirated this morning at breakfast.  He states that he had a G-tube in the past and is only supposed to take [place].  However he ate regular food this morning and thinks he coughed.  Chest x-Cody Albus here is clear.  We are currently at 8/2 hours after recurrence.  He is requesting pain medication.  I  reviewed the database and he received a 7 days of the supply of his 50 mg MS from Dr. Esmond Harps in [place]. He is mildly hyperkalemic here.  His renal function appears normal. 6:44 PM Main stable here in the ED.  His saturations are normal even off of oxygen.  Repeat lactic acid has normalized.  He is not febrile.  His initial potassium was elevated at 5.5.  This may have been hemolyzed he takes oral potassium.  Repeat potassium is pending.  This is normal he will continue his normal oral repletion, otherwise we will hold this he will be rechecked.  We discussed that he will need to return if he is worse at any time especially increased dyspnea, productive cough, or fever. Vitals:   01/24/18 1555 01/24/18 1600  BP: (!) 143/84 135/84  Pulse: 99 97  Resp: 16   Temp:    SpO2: 100% 100%    Roger Flores is on oxygen and is vital signs are normal with the exception of mild elevation of his blood pressure.  He does not appear to be acutely withdrawing.  I see no evidence of infection at this time.  I have discussed return precautions and need for follow-up.  I have also discussed that he will need to get his narcotic medication from his physician.  Final Clinical Impressions(s) / ED Diagnoses   Final diagnoses:  Choking episode    ED Discharge Orders    None       Pattricia Boss, MD 01/24/18 5027

## 2018-02-27 ENCOUNTER — Encounter (HOSPITAL_COMMUNITY): Payer: Self-pay | Admitting: Emergency Medicine

## 2018-02-27 ENCOUNTER — Emergency Department (HOSPITAL_COMMUNITY): Payer: Medicare Other

## 2018-02-27 ENCOUNTER — Emergency Department (HOSPITAL_COMMUNITY)
Admission: EM | Admit: 2018-02-27 | Discharge: 2018-02-28 | Disposition: A | Discharge status: Home / Self Care | Payer: Medicare Other | Attending: Emergency Medicine | Admitting: Emergency Medicine

## 2018-02-27 ENCOUNTER — Other Ambulatory Visit: Payer: Self-pay

## 2018-02-27 DIAGNOSIS — Z79899 Other long term (current) drug therapy: Secondary | ICD-10-CM | POA: Insufficient documentation

## 2018-02-27 DIAGNOSIS — Z8521 Personal history of malignant neoplasm of larynx: Secondary | ICD-10-CM | POA: Insufficient documentation

## 2018-02-27 DIAGNOSIS — I110 Hypertensive heart disease with heart failure: Secondary | ICD-10-CM | POA: Diagnosis not present

## 2018-02-27 DIAGNOSIS — I509 Heart failure, unspecified: Secondary | ICD-10-CM | POA: Diagnosis not present

## 2018-02-27 DIAGNOSIS — Z7901 Long term (current) use of anticoagulants: Secondary | ICD-10-CM | POA: Diagnosis not present

## 2018-02-27 DIAGNOSIS — Z8673 Personal history of transient ischemic attack (TIA), and cerebral infarction without residual deficits: Secondary | ICD-10-CM | POA: Diagnosis not present

## 2018-02-27 DIAGNOSIS — R739 Hyperglycemia, unspecified: Secondary | ICD-10-CM

## 2018-02-27 DIAGNOSIS — J441 Chronic obstructive pulmonary disease with (acute) exacerbation: Secondary | ICD-10-CM

## 2018-02-27 DIAGNOSIS — Z794 Long term (current) use of insulin: Secondary | ICD-10-CM | POA: Diagnosis not present

## 2018-02-27 DIAGNOSIS — E1165 Type 2 diabetes mellitus with hyperglycemia: Secondary | ICD-10-CM | POA: Diagnosis not present

## 2018-02-27 DIAGNOSIS — F17210 Nicotine dependence, cigarettes, uncomplicated: Secondary | ICD-10-CM | POA: Diagnosis not present

## 2018-02-27 DIAGNOSIS — I252 Old myocardial infarction: Secondary | ICD-10-CM | POA: Insufficient documentation

## 2018-02-27 DIAGNOSIS — R072 Precordial pain: Secondary | ICD-10-CM | POA: Diagnosis present

## 2018-02-27 LAB — BASIC METABOLIC PANEL
Anion gap: 14 (ref 5–15)
BUN: 17 mg/dL (ref 6–20)
CALCIUM: 9.7 mg/dL (ref 8.9–10.3)
CHLORIDE: 98 mmol/L — AB (ref 101–111)
CO2: 23 mmol/L (ref 22–32)
CREATININE: 0.81 mg/dL (ref 0.61–1.24)
Glucose, Bld: 460 mg/dL — ABNORMAL HIGH (ref 65–99)
Potassium: 4.7 mmol/L (ref 3.5–5.1)
SODIUM: 135 mmol/L (ref 135–145)

## 2018-02-27 LAB — CBC
HCT: 48.5 % (ref 39.0–52.0)
Hemoglobin: 16.5 g/dL (ref 13.0–17.0)
MCH: 29.4 pg (ref 26.0–34.0)
MCHC: 34 g/dL (ref 30.0–36.0)
MCV: 86.5 fL (ref 78.0–100.0)
PLATELETS: 253 10*3/uL (ref 150–400)
RBC: 5.61 MIL/uL (ref 4.22–5.81)
RDW: 14.4 % (ref 11.5–15.5)
WBC: 23.2 10*3/uL — AB (ref 4.0–10.5)

## 2018-02-27 LAB — I-STAT TROPONIN, ED: TROPONIN I, POC: 0 ng/mL (ref 0.00–0.08)

## 2018-02-27 MED ORDER — ALBUTEROL SULFATE (2.5 MG/3ML) 0.083% IN NEBU
5.0000 mg | INHALATION_SOLUTION | Freq: Once | RESPIRATORY_TRACT | Status: AC
  Administered 2018-02-28: 5 mg via RESPIRATORY_TRACT
  Filled 2018-02-27: qty 6

## 2018-02-27 MED ORDER — IPRATROPIUM BROMIDE 0.02 % IN SOLN
0.5000 mg | Freq: Once | RESPIRATORY_TRACT | Status: AC
  Administered 2018-02-28: 0.5 mg via RESPIRATORY_TRACT
  Filled 2018-02-27: qty 2.5

## 2018-02-27 MED ORDER — FENTANYL CITRATE (PF) 100 MCG/2ML IJ SOLN
100.0000 ug | Freq: Once | INTRAMUSCULAR | Status: AC
  Administered 2018-02-28: 100 ug via INTRAVENOUS
  Filled 2018-02-27: qty 2

## 2018-02-27 MED ORDER — ALBUTEROL SULFATE (2.5 MG/3ML) 0.083% IN NEBU
5.0000 mg | INHALATION_SOLUTION | Freq: Once | RESPIRATORY_TRACT | Status: AC
  Administered 2018-02-27: 5 mg via RESPIRATORY_TRACT
  Filled 2018-02-27: qty 6

## 2018-02-27 MED ORDER — INSULIN ASPART 100 UNIT/ML ~~LOC~~ SOLN
10.0000 [IU] | Freq: Once | SUBCUTANEOUS | Status: AC
Start: 2018-02-27 — End: 2018-02-28
  Administered 2018-02-28: 10 [IU] via SUBCUTANEOUS
  Filled 2018-02-27: qty 1

## 2018-02-27 MED ORDER — METHYLPREDNISOLONE SODIUM SUCC 125 MG IJ SOLR
125.0000 mg | Freq: Once | INTRAMUSCULAR | Status: AC
  Administered 2018-02-28: 125 mg via INTRAVENOUS
  Filled 2018-02-27: qty 2

## 2018-02-27 NOTE — ED Triage Notes (Addendum)
Pt reports increased SOB and chest tightness today, took two nitro which did not help w/ the symptoms.  Pt stated hospice was suppose to come to house to start services however did not and his Dr. Rockey Situ him to come to ED to get control of his pain.

## 2018-02-27 NOTE — ED Provider Notes (Signed)
[facility] EMERGENCY DEPARTMENT Provider Note   CSN: [id number] Arrival date & time: 02/27/18  2039     History   Chief Complaint Chief Complaint  Patient presents with  . Chest Pain  . Shortness of Breath    HPI Roger Flores is a 57 y.o. male.  The history is provided by the patient.  Chest Pain   This is a new problem. The problem occurs constantly. The problem has been gradually improving. The pain is present in the substernal region. The pain is moderate. The quality of the pain is described as pressure-like. The pain does not radiate. Exacerbated by: nothing. Associated symptoms include cough and shortness of breath. Pertinent negatives include no fever.  Shortness of Breath  Associated symptoms include cough and chest pain. Pertinent negatives include no fever.  Patient with history of COPD, CHF, diabetes presents with chest pain or shortness of breath.  He reports ongoing cough and shortness of breath due to his COPD.  He is on home oxygen.  He does admit to coughing up up small amount of blood. Today he does report some "heart pain" that did respond to nitroglycerin Now his biggest concern is cough and shortness of breath.  He reports it usually responds to albuterol and Solu-Medrol.  He does report that he is getting evaluated by hospice due to his end-stage illnesses.  However due to increasing pain he was advised to come to the hospital. Past Medical History:  Diagnosis Date  . Anxiety   . Arthritis    knees and back  . Asthma   . Cancer ([place])    Larynx  . Cataract   . CHF (congestive heart failure) ([place])   . COPD (chronic obstructive pulmonary disease) ([place])   . Depression   . Diabetes mellitus without complication ([place])   . Emphysema of lung ([place])   . GERD (gastroesophageal reflux disease)   . Hyperlipidemia   . Hypertension   . Kidney disease   . Liver disease   . Lung mass   . Myocardial infarction ([place])   . Opioid withdrawal ([place])    . Oxygen deficiency   . Stroke ([place])   . Tobacco use     Patient Active Problem List   Diagnosis Date Noted  . Sepsis ([place]) 10/11/2017  . Cellulitis of leg, left 10/11/2017  . Chronic pain 07/24/2017  . Stroke ([place]) 07/21/2017  . History of cancer of larynx 07/21/2017  . Self-catheterizes urinary bladder 07/21/2017  . 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 DNR no code (do not resuscitate) 07/21/2017  . Insulin dependent diabetes mellitus ([place]) 07/21/2017  . Pressure injury of skin 07/19/2017  . Chest pain 07/18/2017  . COPD, very severe ([place]) 07/18/2017  . Unstable angina pectoris ([place]) 07/01/2017    Past Surgical History:  Procedure Laterality Date  . NECK SURGERY    . THROAT SURGERY          Home Medications    Prior to Admission medications   Medication Sig Start Date End Date Taking? Authorizing Provider  albuterol (PROVENTIL) (2.5 MG/3ML) 0.083% nebulizer solution Take 3 mLs (2.5 mg total) by nebulization every 6 (six) hours as needed for wheezing or shortness of breath. Patient taking differently: Take 2.5 mg every 4 (four) hours as needed by nebulization for wheezing or shortness of breath.  07/20/17   Kathie Dike, MD  apixaban (ELIQUIS) 5 MG TABS tablet Take 1 tablet (5 mg total) by mouth 2 (  two) times daily. 07/04/17   Jola Schmidt, MD  docusate sodium (COLACE) 100 MG capsule Take 300 mg by mouth at bedtime.     [provider]  feeding supplement, GLUCERNA SHAKE, (GLUCERNA SHAKE) LIQD Take 237 mLs 5 (five) times daily by mouth.     [provider]  furosemide (LASIX) 40 MG tablet Take 60 mg daily by mouth.     [provider]  insulin NPH Human (HUMULIN N,NOVOLIN N) 100 UNIT/ML injection Inject 18 Units into the skin at bedtime.     [provider]  insulin regular (NOVOLIN R,HUMULIN R) 100 units/mL injection Inject 8 Units into the skin 3 (three) times daily before meals.     [provider]  LORazepam  (ATIVAN) 0.5 MG tablet Take 1 tablet (0.5 mg total) by mouth every 8 (eight) hours as needed for anxiety. 07/22/17   Duffy Bruce, MD  metFORMIN (GLUCOPHAGE) 1000 MG tablet Take 1,000 mg by mouth 2 (two) times daily. 12/31/17   [provider]  metoprolol succinate (TOPROL-XL) 25 MG 24 hr tablet Take 25 mg by mouth daily. 01/20/18   [provider]  morphine (MS CONTIN) 60 MG 12 hr tablet Take 1 tablet (60 mg total) by mouth every 12 (twelve) hours. 01/11/18   Antonietta Breach, PA-C  morphine (MSIR) 15 MG tablet Take 2 tablets (30 mg total) by mouth every 8 (eight) hours as needed for severe pain. Patient taking differently: Take 15 mg by mouth every 8 (eight) hours as needed for severe pain.  01/11/18   Antonietta Breach, PA-C  nitroGLYCERIN (NITROSTAT) 0.4 MG SL tablet Place 0.4 mg under the tongue every 5 (five) minutes as needed for chest pain.  05/23/17   [provider]  ondansetron (ZOFRAN) 4 MG tablet Take 1 tablet (4 mg total) by mouth every 8 (eight) hours as needed for nausea or vomiting. Patient not taking: Reported on 01/24/2018 08/29/17   Mesner, Corene Cornea, MD  OXYGEN Inhale 3 L continuous into the lungs. At home     [provider]  prochlorperazine (COMPAZINE) 5 MG tablet Take 5 mg by mouth every 6 (six) hours as needed for nausea or vomiting.    [provider]  tamsulosin (FLOMAX) 0.4 MG CAPS capsule Take 0.4 mg daily after breakfast by mouth.     [provider]    Family History Family History  Problem Relation Age of Onset  . Arthritis Mother   . Asthma Mother   . COPD Mother   . Depression Mother   . Hyperlipidemia Mother   . Hypertension Mother   . Miscarriages / [place] Mother   . Alzheimer's disease Mother   . Alcohol abuse Father   . Drug abuse Father   . Early death Father 27       gunshot  . Heart disease Brother 69  . Early death Son        SIDS  . Early death Son        MVA    Social History Social History    Tobacco Use  . Smoking status: Current Every Day Smoker    Packs/day: 1.00    Types: Cigarettes    Start date: 11/25/1977  . Smokeless tobacco: Never Used  Substance Use Topics  . Alcohol use: No  . Drug use: No     Allergies   Patient has no known allergies.   Review of Systems Review of Systems  Constitutional: Negative for fever.  Respiratory: Positive  for cough and shortness of breath.   Cardiovascular: Positive for chest pain.  Musculoskeletal:       Chronic back pain   All other systems reviewed and are negative.    Physical Exam Updated Vital Signs BP 136/85 (BP Location: Right Arm)   Temp 98.1 F (36.7 C) (Oral)   Resp 20   SpO2 98%   Physical Exam CONSTITUTIONAL: Chronically ill-appearing HEAD: Normocephalic/atraumatic EYES: EOMI/PERRL ENMT: Mucous membranes moist NECK: supple no meningeal signs SPINE/BACK:entire spine nontender CV: S1/S2 noted, no murmurs/rubs/gallops noted LUNGS: Coarse wheezing bilaterally, mild tachypnea ABDOMEN: soft, nontender, no rebound or guarding, bowel sounds noted throughout abdomen, PEG tube in place, no erythema noted GU:no cva tenderness NEURO: Pt is awake/alert/appropriate, moves all extremitiesx4.  No facial droop.   EXTREMITIES: pulses normal/equal, full ROM SKIN: warm, color normal PSYCH: no abnormalities of mood noted, alert and oriented to situation   ED Treatments / Results  Labs (all labs ordered are listed, but only abnormal results are displayed) Labs Reviewed  BASIC METABOLIC PANEL - Abnormal; Notable for the following components:      Result Value   Chloride 98 (*)    Glucose, Bld 460 (*)    All other components within normal limits  CBC - Abnormal; Notable for the following components:   WBC 23.2 (*)    All other components within normal limits  CBG MONITORING, ED - Abnormal; Notable for the following components:   Glucose-Capillary 341 (*)    All other components within normal limits  I-STAT  TROPONIN, ED    EKG EKG Interpretation  Date/Time:  Friday February 27 2018 23:11:40 EDT Ventricular Rate:  89 PR Interval:    QRS Duration: 137 QT Interval:  361 QTC Calculation: 440 R Axis:   62 Text Interpretation:  Sinus rhythm Right bundle branch block Abnormal ekg Confirmed by Ripley Fraise [phone]) on 02/27/2018 11:20:05 PM   Radiology Dg Chest 2 View  Result Date: 02/27/2018 CLINICAL DATA:  Increasing shortness of breath and chest tightness today. EXAM: CHEST - 2 VIEW COMPARISON:  01/24/2018 FINDINGS: Linear atelectasis or fibrosis in the left lung base, similar to prior study. Normal heart size and pulmonary vascularity. No focal airspace disease or consolidation in the lungs. No blunting of costophrenic angles. No pneumothorax. Mediastinal contours appear intact. Calcification of the aorta. IMPRESSION: No active cardiopulmonary disease.  Aortic atherosclerosis. Electronically Signed   By: Lucienne Capers M.D.   On: 02/27/2018 23:19    Procedures Procedures    Medications Ordered in ED Medications  fentaNYL (SUBLIMAZE) injection 50 mcg (has no administration in time range)  albuterol (PROVENTIL) (2.5 MG/3ML) 0.083% nebulizer solution 5 mg (5 mg Nebulization Given 02/27/18 2126)  fentaNYL (SUBLIMAZE) injection 100 mcg (100 mcg Intravenous Given 02/28/18 0022)  albuterol (PROVENTIL) (2.5 MG/3ML) 0.083% nebulizer solution 5 mg (5 mg Nebulization Given 02/28/18 0021)  ipratropium (ATROVENT) nebulizer solution 0.5 mg (0.5 mg Nebulization Given 02/28/18 0021)  methylPREDNISolone sodium succinate (SOLU-MEDROL) 125 mg/2 mL injection 125 mg (125 mg Intravenous Given 02/28/18 0022)  insulin aspart (novoLOG) injection 10 Units (10 Units Subcutaneous Given 02/28/18 0022)     Initial Impression / Assessment and Plan / ED Course  I have reviewed the triage vital signs and the nursing notes.  Pertinent labs & imaging results that were available during my care of the patient were reviewed by me and  considered in my medical decision making (see chart for details).     11:49 PM Patient requesting limited workup at  this time.  He would mostly like to have pain control with some improvement in his respiratory status Nebs and pain medicine ordered. We will also give Solu-Medrol, but will also give insulin due to hyperglycemia 2:06 AM Patient improved, no distress.  He is requesting discharge home. He is requesting a prescription for prednisone.  4-day course given, advised him to monitor his glucose. He reports he is supposed to be evaluated by hospice of the next several days. Declines further workup at this time Final Clinical Impressions(s) / ED Diagnoses   Final diagnoses:  COPD exacerbation ([place])  Hyperglycemia    ED Discharge Orders        Ordered    predniSONE (DELTASONE) 50 MG tablet     02/28/18 0202       Ripley Fraise, MD 02/28/18 0207

## 2018-02-28 LAB — CBG MONITORING, ED: Glucose-Capillary: 341 mg/dL — ABNORMAL HIGH (ref 65–99)

## 2018-02-28 MED ORDER — PREDNISONE 50 MG PO TABS: ORAL_TABLET | ORAL | 0 refills | Status: DC

## 2018-02-28 MED ORDER — FENTANYL CITRATE (PF) 100 MCG/2ML IJ SOLN
50.0000 ug | Freq: Once | INTRAMUSCULAR | Status: AC
  Administered 2018-02-28: 50 ug via INTRAVENOUS
  Filled 2018-02-28: qty 2

## 2018-05-19 ENCOUNTER — Other Ambulatory Visit: Payer: Self-pay

## 2018-05-19 ENCOUNTER — Encounter (HOSPITAL_COMMUNITY): Payer: Self-pay

## 2018-05-19 ENCOUNTER — Emergency Department (HOSPITAL_COMMUNITY)
Admission: EM | Admit: 2018-05-19 | Discharge: 2018-05-20 | Disposition: A | Discharge status: Home / Self Care | Payer: Medicare Other | Attending: Emergency Medicine | Admitting: Emergency Medicine

## 2018-05-19 ENCOUNTER — Emergency Department (HOSPITAL_COMMUNITY): Payer: Medicare Other

## 2018-05-19 DIAGNOSIS — I509 Heart failure, unspecified: Secondary | ICD-10-CM | POA: Insufficient documentation

## 2018-05-19 DIAGNOSIS — F17210 Nicotine dependence, cigarettes, uncomplicated: Secondary | ICD-10-CM | POA: Insufficient documentation

## 2018-05-19 DIAGNOSIS — I110 Hypertensive heart disease with heart failure: Secondary | ICD-10-CM | POA: Insufficient documentation

## 2018-05-19 DIAGNOSIS — Z79899 Other long term (current) drug therapy: Secondary | ICD-10-CM | POA: Insufficient documentation

## 2018-05-19 DIAGNOSIS — E119 Type 2 diabetes mellitus without complications: Secondary | ICD-10-CM | POA: Insufficient documentation

## 2018-05-19 DIAGNOSIS — J441 Chronic obstructive pulmonary disease with (acute) exacerbation: Secondary | ICD-10-CM | POA: Diagnosis not present

## 2018-05-19 DIAGNOSIS — R0602 Shortness of breath: Secondary | ICD-10-CM | POA: Diagnosis present

## 2018-05-19 DIAGNOSIS — R0781 Pleurodynia: Secondary | ICD-10-CM

## 2018-05-19 LAB — CBC
HCT: 52.2 % — ABNORMAL HIGH (ref 39.0–52.0)
HEMOGLOBIN: 16.4 g/dL (ref 13.0–17.0)
MCH: 27.6 pg (ref 26.0–34.0)
MCHC: 31.4 g/dL (ref 30.0–36.0)
MCV: 87.7 fL (ref 78.0–100.0)
PLATELETS: 298 10*3/uL (ref 150–400)
RBC: 5.95 MIL/uL — AB (ref 4.22–5.81)
RDW: 13.9 % (ref 11.5–15.5)
WBC: 21.2 10*3/uL — ABNORMAL HIGH (ref 4.0–10.5)

## 2018-05-19 LAB — I-STAT CG4 LACTIC ACID, ED: Lactic Acid, Venous: 1.45 mmol/L (ref 0.5–1.9)

## 2018-05-19 LAB — I-STAT TROPONIN, ED: Troponin i, poc: 0 ng/mL (ref 0.00–0.08)

## 2018-05-19 NOTE — ED Triage Notes (Signed)
Pt reports that he has had SOB for the past week, has pneumonia, done with antibiotics and not getting better, rhonchi all lobes, diminished on R. CP began today, pt took three nitro that relived pain and then returned. Hx of MI x 2. Productive, blood tinged cough

## 2018-05-20 LAB — BASIC METABOLIC PANEL
ANION GAP: 12 (ref 5–15)
BUN: 9 mg/dL (ref 6–20)
CO2: 21 mmol/L — ABNORMAL LOW (ref 22–32)
CREATININE: 0.64 mg/dL (ref 0.61–1.24)
Calcium: 9.3 mg/dL (ref 8.9–10.3)
Chloride: 105 mmol/L (ref 98–111)
GLUCOSE: 134 mg/dL — AB (ref 70–99)
Potassium: 4.1 mmol/L (ref 3.5–5.1)
Sodium: 138 mmol/L (ref 135–145)

## 2018-05-20 MED ORDER — ALBUTEROL (5 MG/ML) CONTINUOUS INHALATION SOLN
10.0000 mg/h | INHALATION_SOLUTION | Freq: Once | RESPIRATORY_TRACT | Status: AC
  Administered 2018-05-20: 10 mg/h via RESPIRATORY_TRACT
  Filled 2018-05-20: qty 20

## 2018-05-20 MED ORDER — IPRATROPIUM BROMIDE 0.02 % IN SOLN
0.5000 mg | Freq: Once | RESPIRATORY_TRACT | Status: AC
  Administered 2018-05-20: 0.5 mg via RESPIRATORY_TRACT
  Filled 2018-05-20: qty 2.5

## 2018-05-20 MED ORDER — IOPAMIDOL (ISOVUE-370) INJECTION 76%
INTRAVENOUS | Status: AC
  Filled 2018-05-20: qty 100

## 2018-05-20 MED ORDER — KETOROLAC TROMETHAMINE 15 MG/ML IJ SOLN
15.0000 mg | Freq: Once | INTRAMUSCULAR | Status: AC
  Administered 2018-05-20: 15 mg via INTRAVENOUS
  Filled 2018-05-20: qty 1

## 2018-05-20 MED ORDER — PREDNISONE 10 MG PO TABS: 60.0000 mg | ORAL_TABLET | Freq: Every day | ORAL | 0 refills | Status: DC

## 2018-05-20 MED ORDER — DOXYCYCLINE HYCLATE 100 MG PO CAPS: 100.0000 mg | ORAL_CAPSULE | Freq: Two times a day (BID) | ORAL | 0 refills | Status: DC

## 2018-05-20 MED ORDER — MORPHINE SULFATE 15 MG PO TABS
30.0000 mg | ORAL_TABLET | ORAL | Status: DC | PRN
  Administered 2018-05-20: 30 mg via ORAL
  Filled 2018-05-20: qty 2

## 2018-05-20 NOTE — ED Notes (Signed)
CT here for transport, pt refuses unless he gets ore pain medication and sees the doctor.

## 2018-05-20 NOTE — ED Notes (Signed)
CT informed of adequate IV access for CT Angio.

## 2018-05-20 NOTE — ED Provider Notes (Signed)
[facility] EMERGENCY DEPARTMENT Provider Note   CSN: [id number] Arrival date & time: 05/19/18  2243     History   Chief Complaint Chief Complaint  Patient presents with  . Chest Pain  . Shortness of Breath    HPI Roger Flores is a 57 y.o. male.  HPI   57 y.o. male with PMH of lung cancer, CAD, CHF, DM, HTN, liver failure, MI, PE, and stroke (on Eliquis) comes in with chief complaint of chest pain.  Patient states that he has been having hemoptysis for the last several months, but over the past 2 weeks he has been having shortness of breath and chest pain.  Chest pain is located over the right base, and it is worse with inspiration and cough.  Patient has been taking Levaquin for the last 2 weeks, and he continues to have bloody and green sputum.  Patient also is having subjective fevers.    Patient states that he has been missing his Eliquis doses for DVT occasionally because the co-pay is high.  Patient states that hospice service let him go, because he has not declined over the past 6 months.  He gets his care normally at [facility].  Patient is requesting IV morphine stating that he has finished his morphine that was prescribed to him.    Past Medical History:  Diagnosis Date  . Anxiety   . Arthritis    knees and back  . Asthma   . Cancer ([place])    Larynx  . Cataract   . CHF (congestive heart failure) ([place])   . COPD (chronic obstructive pulmonary disease) ([place])   . Depression   . Diabetes mellitus without complication ([place])   . Emphysema of lung ([place])   . GERD (gastroesophageal reflux disease)   . Hyperlipidemia   . Hypertension   . Kidney disease   . Liver disease   . Lung mass   . Myocardial infarction ([place])   . Opioid withdrawal ([place])   . Oxygen deficiency   . Stroke ([place])   . Tobacco use     Patient Active Problem List   Diagnosis Date Noted  . Sepsis ([place]) 10/11/2017  . Cellulitis of leg, left 10/11/2017  . Chronic pain  07/24/2017  . Stroke ([place]) 07/21/2017  . History of cancer of larynx 07/21/2017  . Self-catheterizes urinary bladder 07/21/2017  . 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 DNR no code (do not resuscitate) 07/21/2017  . Insulin dependent diabetes mellitus ([place]) 07/21/2017  . Pressure injury of skin 07/19/2017  . Chest pain 07/18/2017  . COPD, very severe ([place]) 07/18/2017  . Unstable angina pectoris ([place]) 07/01/2017    Past Surgical History:  Procedure Laterality Date  . NECK SURGERY    . THROAT SURGERY          Home Medications    Prior to Admission medications   Medication Sig Start Date End Date Taking? Authorizing Provider  albuterol (PROVENTIL) (2.5 MG/3ML) 0.083% nebulizer solution Take 3 mLs (2.5 mg total) by nebulization every 6 (six) hours as needed for wheezing or shortness of breath. Patient taking differently: Take 2.5 mg by nebulization every 4 (four) hours.  07/20/17   Kathie Dike, MD  apixaban (ELIQUIS) 5 MG TABS tablet Take 1 tablet (5 mg total) by mouth 2 (two) times daily. 07/04/17   Jola Schmidt, MD  docusate sodium (COLACE) 100 MG capsule Take 100 mg by mouth daily as needed for mild constipation.  [provider]  doxycycline (VIBRAMYCIN) 100 MG capsule Take 1 capsule (100 mg total) by mouth 2 (two) times daily. 05/20/18   Varney Biles, MD  feeding supplement, GLUCERNA SHAKE, (GLUCERNA SHAKE) LIQD Take 237 mLs 5 (five) times daily by mouth.     [provider]  furosemide (LASIX) 40 MG tablet Take 60 mg daily by mouth.     [provider]  insulin NPH Human (HUMULIN N,NOVOLIN N) 100 UNIT/ML injection Inject 18 Units into the skin at bedtime.     [provider]  insulin regular (NOVOLIN R,HUMULIN R) 100 units/mL injection Inject 8 Units into the skin 3 (three) times daily before meals.     [provider]  LORazepam (ATIVAN) 0.5 MG tablet Take 1 tablet (0.5 mg total) by mouth every 8 (eight) hours as  needed for anxiety. Patient not taking: Reported on 02/28/2018 07/22/17   Duffy Bruce, MD  metFORMIN (GLUCOPHAGE) 1000 MG tablet Take 1,000 mg by mouth 2 (two) times daily. 12/31/17   [provider]  metoprolol succinate (TOPROL-XL) 25 MG 24 hr tablet Take 25 mg by mouth daily. 01/20/18   [provider]  morphine (MS CONTIN) 60 MG 12 hr tablet Take 1 tablet (60 mg total) by mouth every 12 (twelve) hours. Patient not taking: Reported on 02/28/2018 01/11/18   Antonietta Breach, PA-C  morphine (MSIR) 15 MG tablet Take 2 tablets (30 mg total) by mouth every 8 (eight) hours as needed for severe pain. Patient not taking: Reported on 02/28/2018 01/11/18   Antonietta Breach, PA-C  nitroGLYCERIN (NITROSTAT) 0.4 MG SL tablet Place 0.4 mg under the tongue every 5 (five) minutes as needed for chest pain.  05/23/17   [provider]  ondansetron (ZOFRAN) 4 MG tablet Take 1 tablet (4 mg total) by mouth every 8 (eight) hours as needed for nausea or vomiting. Patient not taking: Reported on 01/24/2018 08/29/17   Mesner, Corene Cornea, MD  OXYGEN Inhale 3 L continuous into the lungs. At home     [provider]  predniSONE (DELTASONE) 10 MG tablet Take 6 tablets (60 mg total) by mouth daily. 05/20/18   Varney Biles, MD  prochlorperazine (COMPAZINE) 5 MG tablet Take 5 mg by mouth every 6 (six) hours as needed for nausea or vomiting.    [provider]  tamsulosin (FLOMAX) 0.4 MG CAPS capsule Take 0.4 mg daily after breakfast by mouth.     [provider]    Family History Family History  Problem Relation Age of Onset  . Arthritis Mother   . Asthma Mother   . COPD Mother   . Depression Mother   . Hyperlipidemia Mother   . Hypertension Mother   . Miscarriages / [place] Mother   . Alzheimer's disease Mother   . Alcohol abuse Father   . Drug abuse Father   . Early death Father 66       gunshot  . Heart disease Brother 65  . Early death Son        SIDS  . Early death Son          MVA    Social History Social History   Tobacco Use  . Smoking status: Current Every Day Smoker    Packs/day: 1.00    Types: Cigarettes    Start date: 11/25/1977  . Smokeless tobacco: Never Used  Substance Use Topics  . Alcohol use: No  . Drug use: No     Allergies   Patient has  no known allergies.   Review of Systems Review of Systems  Constitutional: Positive for activity change and fever.  Respiratory: Positive for cough, shortness of breath and wheezing.   Cardiovascular: Positive for chest pain.  Allergic/Immunologic: Negative for immunocompromised state.  Hematological: Bruises/bleeds easily.  All other systems reviewed and are negative.    Physical Exam Updated Vital Signs BP 137/86   Pulse (!) 118   Temp 98.3 F (36.8 C) (Oral)   Resp (!) 31   SpO2 98%   Physical Exam  Constitutional: He is oriented to person, place, and time. He appears well-developed. No distress.  HENT:  Head: Atraumatic.  Neck: Neck supple.  Cardiovascular:  Heart rate in the 90s, no JVD  Pulmonary/Chest: Effort normal. No accessory muscle usage or stridor. No respiratory distress. He has wheezes in the right middle field, the right lower field, the left middle field and the left lower field. He has rales in the right lower field and the left lower field.  Musculoskeletal:       Right lower leg: He exhibits no edema.       Left lower leg: He exhibits no edema.  Neurological: He is alert and oriented to person, place, and time.  Skin: Skin is warm.  Nursing note and vitals reviewed.    ED Treatments / Results  Labs (all labs ordered are listed, but only abnormal results are displayed) Labs Reviewed  BASIC METABOLIC PANEL - Abnormal; Notable for the following components:      Result Value   CO2 21 (*)    Glucose, Bld 134 (*)    All other components within normal limits  CBC - Abnormal; Notable for the following components:   WBC 21.2 (*)    RBC 5.95 (*)    HCT 52.2  (*)    All other components within normal limits  I-STAT TROPONIN, ED  I-STAT CG4 LACTIC ACID, ED  I-STAT CG4 LACTIC ACID, ED    EKG EKG Interpretation  Date/Time:  Tuesday May 19 2018 22:50:11 EDT Ventricular Rate:  113 PR Interval:  130 QRS Duration: 122 QT Interval:  342 QTC Calculation: 469 R Axis:   18 Text Interpretation:  Sinus tachycardia Right bundle branch block Abnormal ECG No acute changes No significant change since last tracing Confirmed by Varney Biles ([id number]) on 05/19/2018 11:42:23 PM   Radiology Dg Chest 2 View  Result Date: 05/19/2018 CLINICAL DATA:  57 year old male with chest pain.  History of COPD. EXAM: CHEST - 2 VIEW COMPARISON:  Chest radiograph dated 02/26/2018 and CT dated 07/28/2017 FINDINGS: There is mild centrilobular emphysema. No focal consolidation, pleural effusion, or pneumothorax. The cardiac silhouette is within normal limits. No acute osseous pathology. IMPRESSION: No active cardiopulmonary disease. Electronically Signed   By: Anner Crete M.D.   On: 05/19/2018 23:42    Procedures Procedures (including critical care time)  Medications Ordered in ED Medications  morphine (MSIR) tablet 30 mg (30 mg Oral Given 05/20/18 0037)  iopamidol (ISOVUE-370) 76 % injection (has no administration in time range)  ketorolac (TORADOL) 15 MG/ML injection 15 mg (15 mg Intravenous Given 05/20/18 0020)  albuterol (PROVENTIL,VENTOLIN) solution continuous neb (10 mg/hr Nebulization Given 05/20/18 0017)  ipratropium (ATROVENT) nebulizer solution 0.5 mg (0.5 mg Nebulization Given 05/20/18 0017)     Initial Impression / Assessment and Plan / ED Course  I have reviewed the triage vital signs and the nursing notes.  Pertinent labs & imaging results that were available during my care of the  patient were reviewed by me and considered in my medical decision making (see chart for details).     57 year old male with multiple comorbidities including severe COPD and  PE-DVT comes in with chief complaint of pleuritic chest pain and cough.  Patient also is having subjective fevers and states that he has been taking Levaquin for 2 weeks without any significant improvement.  On my exam, there is diffuse wheezing however there is no focal rhonchi.  Patient does not have any evidence of volume overload.  Patient has elevated white count, chest x-ray does not reveal any new infiltrates.  Initial differential diagnosis includes questionable pneumonia, in addition there is also concerns for PE, new metastases.  Patient also states that he has been missing intermittent doses of Eliquis, therefore we found it prudent to get CT PE.  1:46 AM Patient requested IV morphine.  On [place] controlled substance database it is shown that patient has been getting weekly prescription for 15 mg morphine sulfate immediate release in addition to extended release morphine.  With the chest x-ray showing no acute findings, patient in no acute distress -we gave him 30 mg of morphine sulfate immediate release which is double his home dose and additionally ordered Toradol IV.  Patient was upset that he did not receive IV pain meds.  He refused to get CT PE until he got his pain meds.   I informed patient that we gave him double his immediate release morphine, and IV Toradol -which in the setting of no acute findings is sufficient.  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  I informed him again that we gave him Toradol and not tramadol.  At that time I decided to check care everywhere and it seems like patient has been seen at other facilities including [facility] in [place].  It also seems that at his other ED visits he request IV pain meds and there appears to be diagnosis of narcotic dependence.  I informed patient that we will carry on with our plan for pain control, at which time he decided to leave [place].  Patient wants to leave against medical advice. Patient understands that his actions will lead to inadequate medical workup, and that he is at risk of complications of missed diagnosis, which includes morbidity and mortality.  Opportunity to change mind given. Discussion witnessed by RN. Patient is demonstrating good capacity to make decision. Patient understands that he needs to return to the ER immediately if his symptoms get worse.    Final Clinical Impressions(s) / ED Diagnoses   Final diagnoses:  Pleuritic chest pain  COPD exacerbation [facility])    ED Discharge Orders        Ordered    doxycycline (VIBRAMYCIN) 100 MG capsule  2 times daily     05/20/18 0135    predniSONE (DELTASONE) 10 MG tablet  Daily     05/20/18 0135       Varney Biles, MD 05/20/18 0153

## 2018-05-20 NOTE — ED Notes (Signed)
Pt states that he is upset with pain medication and request to speak with another doctor

## 2018-05-20 NOTE — ED Notes (Signed)
Pt refuses to wait for paperwork or to sign esignature for [facility]

## 2018-05-20 NOTE — ED Notes (Signed)
Pt refuses to have CT scan done without IV morphine, pt leaving AMA

## 2018-11-04 IMAGING — CR DG CHEST 2V
2 series · 2 of 2 positions shown · non-contrast
Comparison: Chest radiograph dated 02/26/2018 and CT dated
07/28/2017

CLINICAL DATA: 56-year-old male with chest pain.  History of COPD.

EXAM:
CHEST - 2 VIEW

[chest lat]
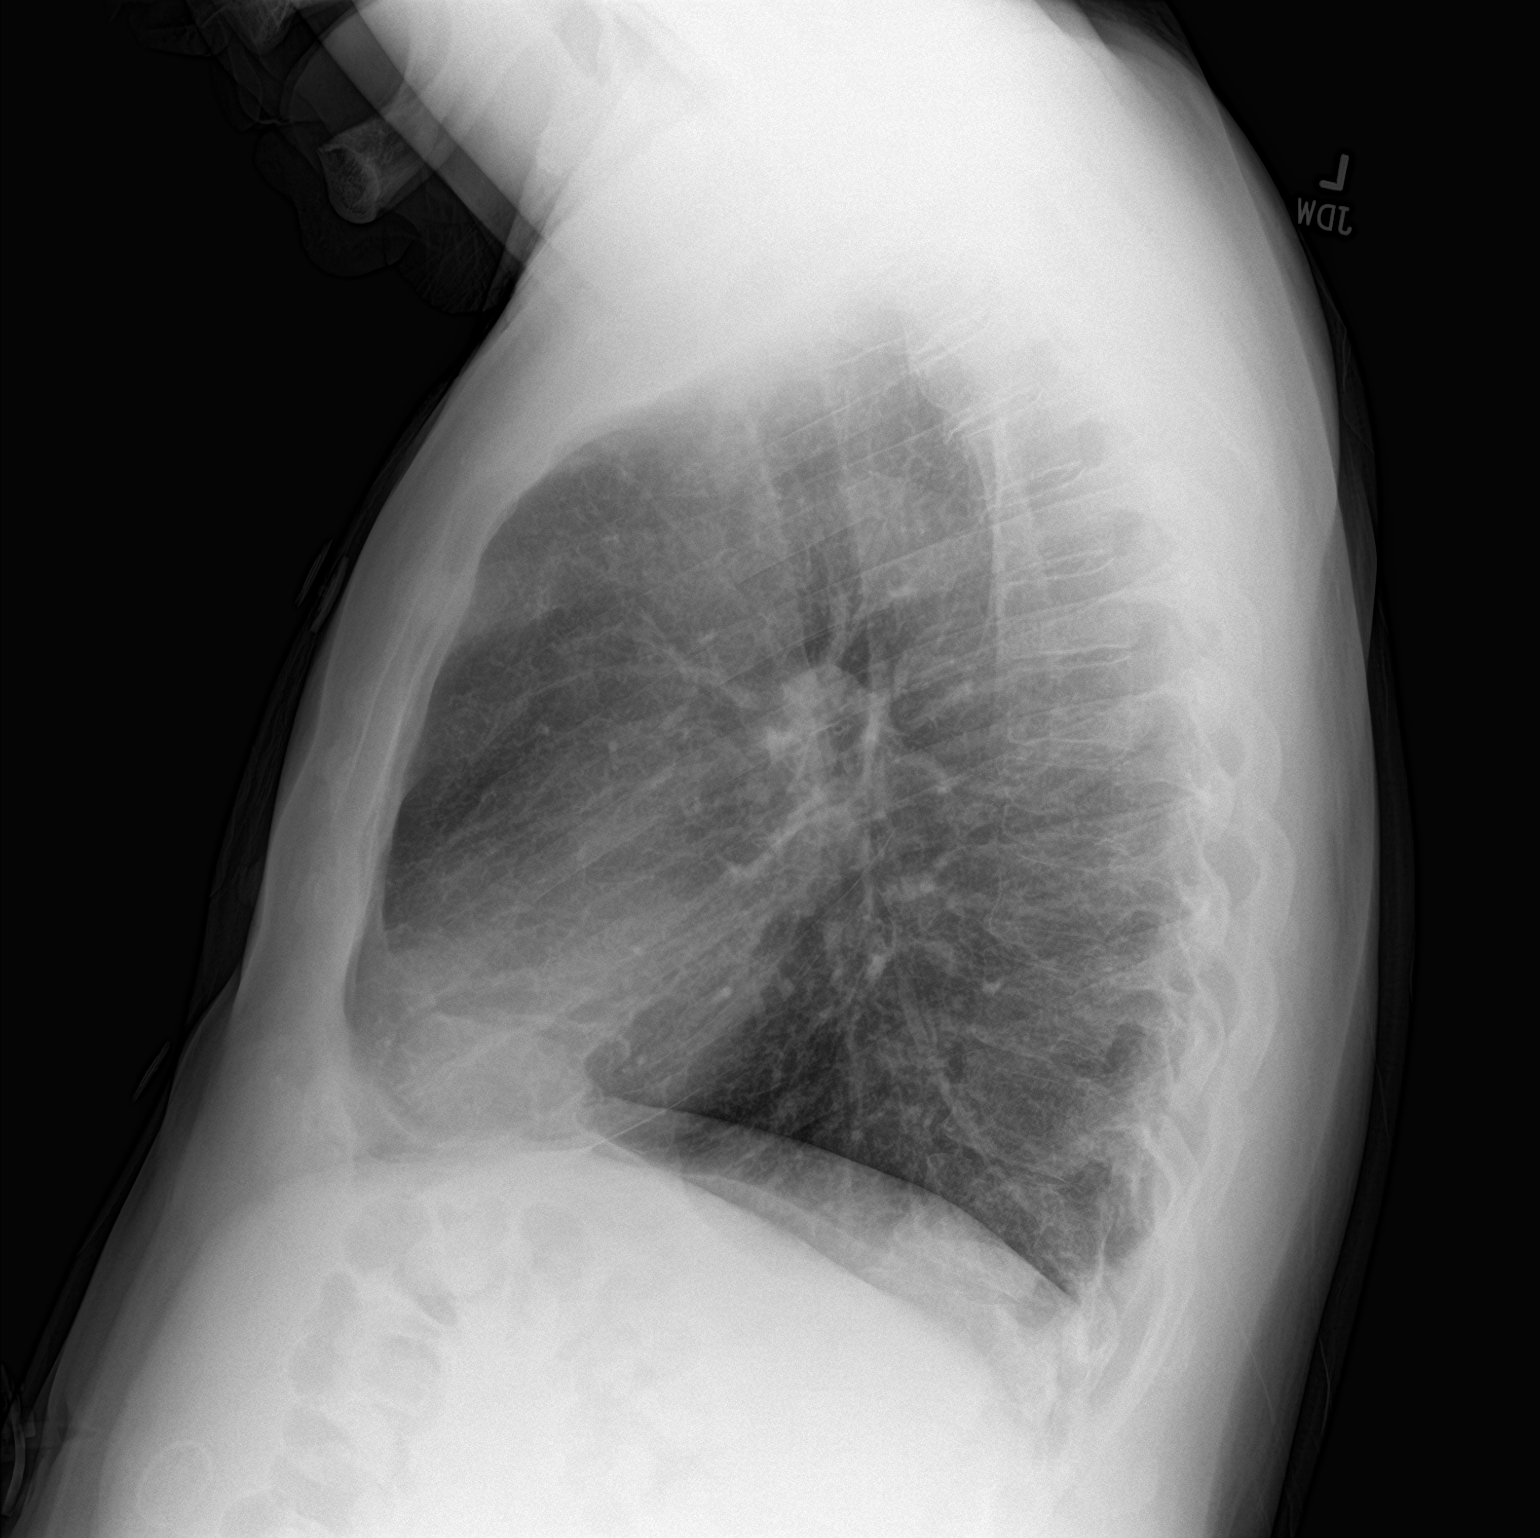

[chest pa]
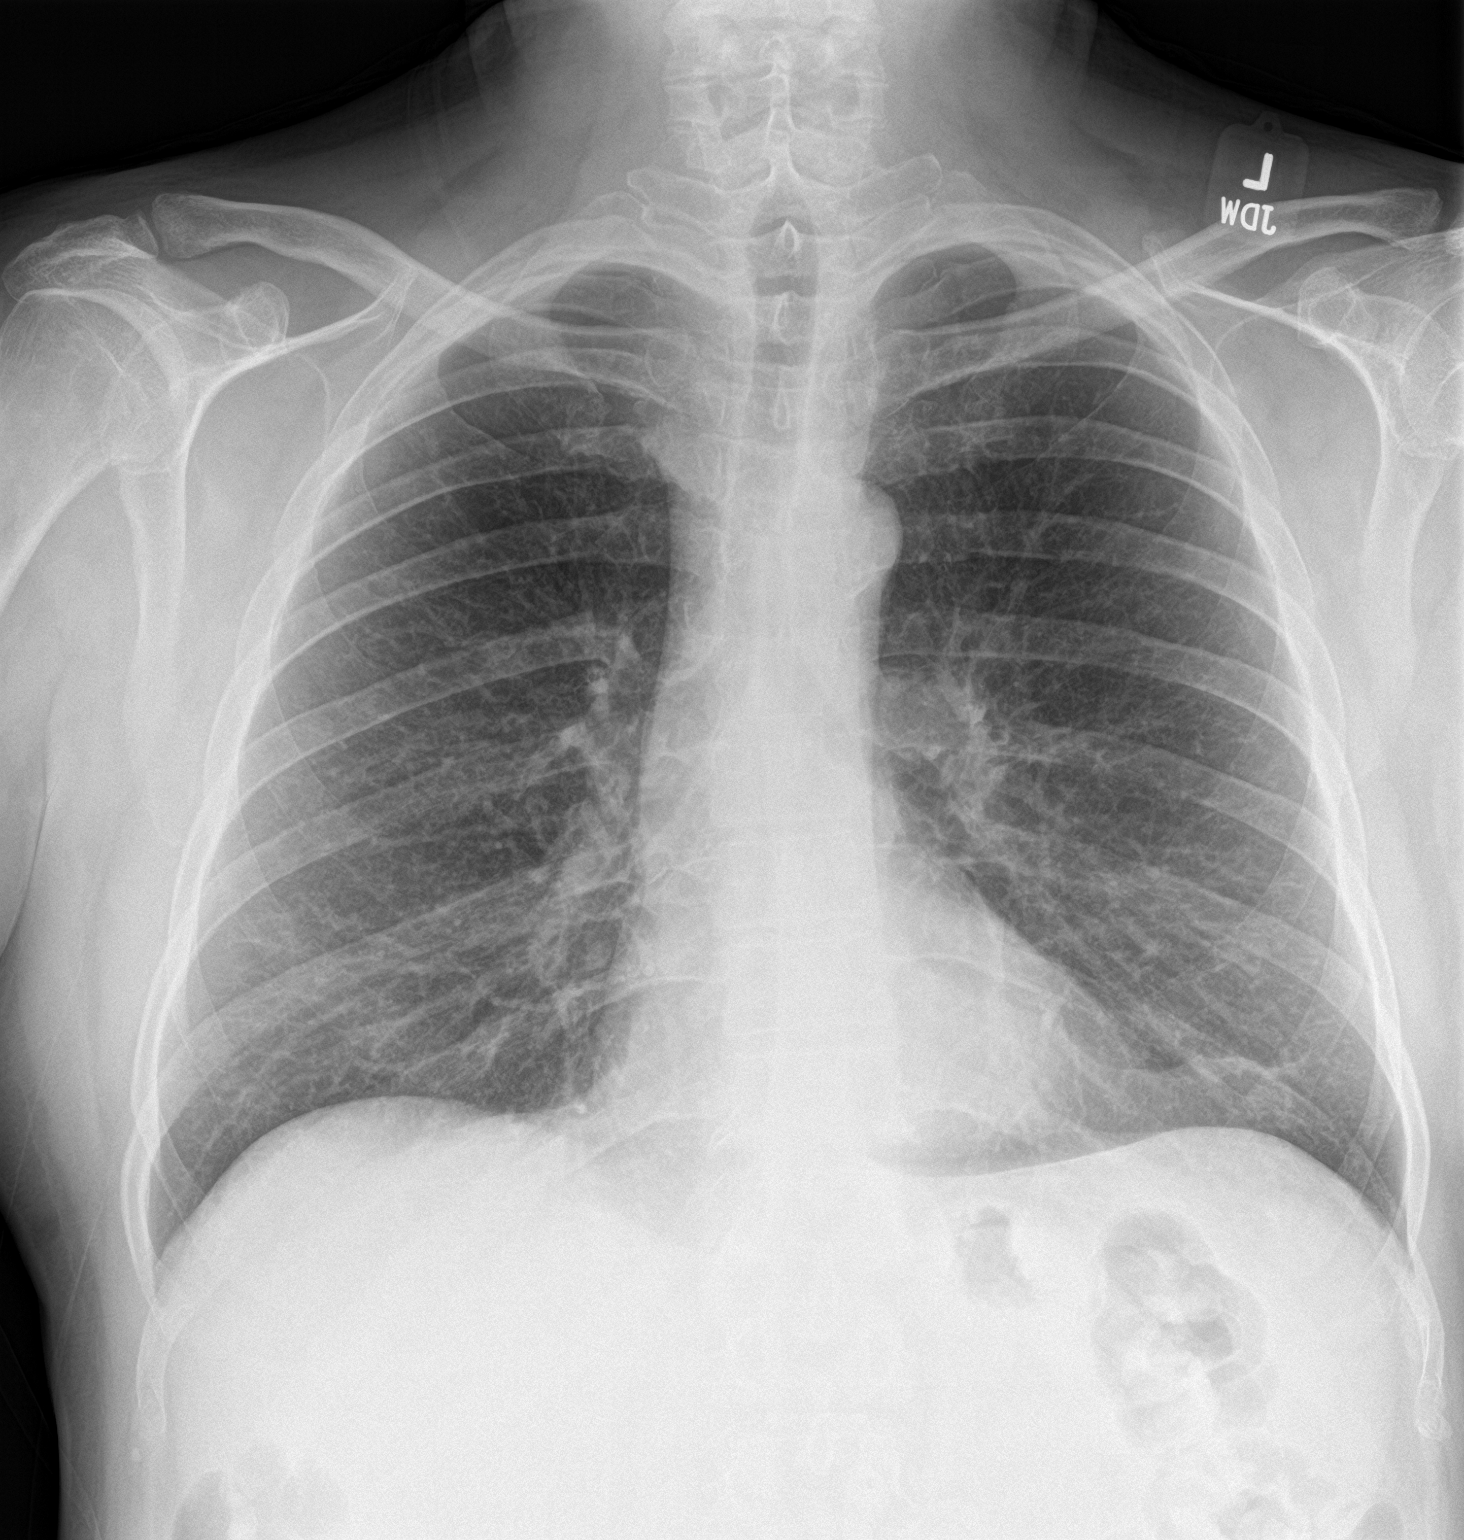

[2 of 2 positions shown; findings below may reference images not displayed]

FINDINGS: There is mild centrilobular emphysema. No focal consolidation,
pleural effusion, or pneumothorax. The cardiac silhouette is within
normal limits. No acute osseous pathology.
IMPRESSION: No active cardiopulmonary disease.

## 2019-08-18 ENCOUNTER — Encounter (HOSPITAL_COMMUNITY): Payer: Self-pay | Admitting: Emergency Medicine

## 2019-08-18 ENCOUNTER — Other Ambulatory Visit: Payer: Self-pay

## 2019-08-18 ENCOUNTER — Emergency Department (HOSPITAL_COMMUNITY)
Admission: EM | Admit: 2019-08-18 | Discharge: 2019-08-19 | Discharge status: Against Medical Advice | Payer: Medicare Other | Attending: Emergency Medicine | Admitting: Emergency Medicine

## 2019-08-18 DIAGNOSIS — Z5321 Procedure and treatment not carried out due to patient leaving prior to being seen by health care provider: Secondary | ICD-10-CM | POA: Diagnosis not present

## 2019-08-18 DIAGNOSIS — R1011 Right upper quadrant pain: Secondary | ICD-10-CM | POA: Diagnosis present

## 2019-08-18 LAB — URINALYSIS, ROUTINE W REFLEX MICROSCOPIC
Bacteria, UA: NONE SEEN
Bilirubin Urine: NEGATIVE
Glucose, UA: >=500 mg/dL — AB
Hgb urine dipstick: NEGATIVE
Ketones, ur: NEGATIVE mg/dL
Leukocytes,Ua: NEGATIVE
Nitrite: NEGATIVE
Protein, ur: NEGATIVE mg/dL
Specific Gravity, Urine: 1.032 — ABNORMAL HIGH (ref 1.005–1.030)
pH: 7 (ref 5.0–8.0)

## 2019-08-18 LAB — CBC
HCT: 51.9 % (ref 39.0–52.0)
Hemoglobin: 17.7 g/dL — ABNORMAL HIGH (ref 13.0–17.0)
MCH: 29.9 pg (ref 26.0–34.0)
MCHC: 34.1 g/dL (ref 30.0–36.0)
MCV: 87.7 fL (ref 80.0–100.0)
Platelets: 276 10*3/uL (ref 150–400)
RBC: 5.92 MIL/uL — ABNORMAL HIGH (ref 4.22–5.81)
RDW: 13.1 % (ref 11.5–15.5)
WBC: 9.7 10*3/uL (ref 4.0–10.5)
nRBC: 0 % (ref 0.0–0.2)

## 2019-08-18 LAB — COMPREHENSIVE METABOLIC PANEL
ALT: 30 U/L (ref 0–44)
AST: 22 U/L (ref 15–41)
Albumin: 3.6 g/dL (ref 3.5–5.0)
Alkaline Phosphatase: 73 U/L (ref 38–126)
Anion gap: 12 (ref 5–15)
BUN: 15 mg/dL (ref 6–20)
CO2: 25 mmol/L (ref 22–32)
Calcium: 9.3 mg/dL (ref 8.9–10.3)
Chloride: 97 mmol/L — ABNORMAL LOW (ref 98–111)
Creatinine, Ser: 0.78 mg/dL (ref 0.61–1.24)
GFR calc Af Amer: >60 mL/min (ref >60)
GFR calc non Af Amer: >60 mL/min (ref >60)
Glucose, Bld: 437 mg/dL — ABNORMAL HIGH (ref 70–99)
Potassium: 4.3 mmol/L (ref 3.5–5.1)
Sodium: 134 mmol/L — ABNORMAL LOW (ref 135–145)
Total Bilirubin: 0.3 mg/dL (ref 0.3–1.2)
Total Protein: 7.7 g/dL (ref 6.5–8.1)

## 2019-08-18 LAB — LIPASE, BLOOD: Lipase: 36 U/L (ref 11–51)

## 2019-08-18 NOTE — ED Triage Notes (Signed)
Patient reports persistent RUQ pain with emesis this week , seen at [facility] last week referred to a surgeon for cholecystectomy ( gallbladder disease) . Denies fever or diarrhea .

## 2019-08-19 NOTE — ED Notes (Signed)
Pt asks can he die if he goes home with gallbladder issues. I state that in [place] the MD is the only one allowed to legally tell him what is wrong with him and we can't answer that question for him. I tell him we advise everyone to stay and be seen by the provider. 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 Wants to leave to go to another hospital.

## 2019-12-19 ENCOUNTER — Other Ambulatory Visit: Payer: Self-pay

## 2019-12-19 ENCOUNTER — Emergency Department (HOSPITAL_COMMUNITY)
Admission: EM | Admit: 2019-12-19 | Discharge: 2019-12-20 | Disposition: A | Discharge status: Home / Self Care | Payer: Medicare Other | Attending: Emergency Medicine | Admitting: Emergency Medicine

## 2019-12-19 ENCOUNTER — Emergency Department (HOSPITAL_COMMUNITY): Payer: Medicare Other

## 2019-12-19 ENCOUNTER — Encounter (HOSPITAL_COMMUNITY): Payer: Self-pay | Admitting: *Deleted

## 2019-12-19 DIAGNOSIS — J449 Chronic obstructive pulmonary disease, unspecified: Secondary | ICD-10-CM | POA: Diagnosis not present

## 2019-12-19 DIAGNOSIS — I110 Hypertensive heart disease with heart failure: Secondary | ICD-10-CM | POA: Diagnosis not present

## 2019-12-19 DIAGNOSIS — W19XXXA Unspecified fall, initial encounter: Secondary | ICD-10-CM | POA: Insufficient documentation

## 2019-12-19 DIAGNOSIS — Y939 Activity, unspecified: Secondary | ICD-10-CM | POA: Insufficient documentation

## 2019-12-19 DIAGNOSIS — M25572 Pain in left ankle and joints of left foot: Secondary | ICD-10-CM | POA: Insufficient documentation

## 2019-12-19 DIAGNOSIS — E119 Type 2 diabetes mellitus without complications: Secondary | ICD-10-CM | POA: Diagnosis not present

## 2019-12-19 DIAGNOSIS — Y929 Unspecified place or not applicable: Secondary | ICD-10-CM | POA: Diagnosis not present

## 2019-12-19 DIAGNOSIS — S0990XA Unspecified injury of head, initial encounter: Secondary | ICD-10-CM | POA: Diagnosis not present

## 2019-12-19 DIAGNOSIS — I509 Heart failure, unspecified: Secondary | ICD-10-CM | POA: Insufficient documentation

## 2019-12-19 DIAGNOSIS — Z8673 Personal history of transient ischemic attack (TIA), and cerebral infarction without residual deficits: Secondary | ICD-10-CM | POA: Diagnosis not present

## 2019-12-19 DIAGNOSIS — M549 Dorsalgia, unspecified: Secondary | ICD-10-CM | POA: Diagnosis not present

## 2019-12-19 DIAGNOSIS — F17210 Nicotine dependence, cigarettes, uncomplicated: Secondary | ICD-10-CM | POA: Diagnosis not present

## 2019-12-19 DIAGNOSIS — Y999 Unspecified external cause status: Secondary | ICD-10-CM | POA: Insufficient documentation

## 2019-12-19 NOTE — ED Triage Notes (Signed)
The pts wheel fell off his walker and he felol injuring his lt foot lower back anmd  His head no loc

## 2019-12-19 NOTE — ED Triage Notes (Signed)
Recent stroke  Contracture lt arm

## 2019-12-20 ENCOUNTER — Emergency Department (HOSPITAL_COMMUNITY): Payer: Medicare Other

## 2019-12-20 LAB — BASIC METABOLIC PANEL
Anion gap: 14 (ref 5–15)
BUN: 16 mg/dL (ref 6–20)
CO2: 25 mmol/L (ref 22–32)
Calcium: 9.6 mg/dL (ref 8.9–10.3)
Chloride: 93 mmol/L — ABNORMAL LOW (ref 98–111)
Creatinine, Ser: 0.81 mg/dL (ref 0.61–1.24)
GFR calc Af Amer: >60 mL/min (ref >60)
GFR calc non Af Amer: >60 mL/min (ref >60)
Glucose, Bld: 427 mg/dL — ABNORMAL HIGH (ref 70–99)
Potassium: 4.9 mmol/L (ref 3.5–5.1)
Sodium: 132 mmol/L — ABNORMAL LOW (ref 135–145)

## 2019-12-20 LAB — URINALYSIS, ROUTINE W REFLEX MICROSCOPIC
Bacteria, UA: NONE SEEN
Bilirubin Urine: NEGATIVE
Glucose, UA: >=500 mg/dL — AB
Hgb urine dipstick: NEGATIVE
Ketones, ur: NEGATIVE mg/dL
Leukocytes,Ua: NEGATIVE
Nitrite: NEGATIVE
Protein, ur: 30 mg/dL — AB
Specific Gravity, Urine: 1.032 — ABNORMAL HIGH (ref 1.005–1.030)
pH: 5 (ref 5.0–8.0)

## 2019-12-20 LAB — CBC WITH DIFFERENTIAL/PLATELET
Abs Immature Granulocytes: 0.02 10*3/uL (ref 0.00–0.07)
Basophils Absolute: 0.1 10*3/uL (ref 0.0–0.1)
Basophils Relative: 1 %
Eosinophils Absolute: 0.3 10*3/uL (ref 0.0–0.5)
Eosinophils Relative: 3 %
HCT: 54.7 % — ABNORMAL HIGH (ref 39.0–52.0)
Hemoglobin: 17.8 g/dL — ABNORMAL HIGH (ref 13.0–17.0)
Immature Granulocytes: 0 %
Lymphocytes Relative: 42 %
Lymphs Abs: 5 10*3/uL — ABNORMAL HIGH (ref 0.7–4.0)
MCH: 28.3 pg (ref 26.0–34.0)
MCHC: 32.5 g/dL (ref 30.0–36.0)
MCV: 87.1 fL (ref 80.0–100.0)
Monocytes Absolute: 1.3 10*3/uL — ABNORMAL HIGH (ref 0.1–1.0)
Monocytes Relative: 11 %
Neutro Abs: 5.2 10*3/uL (ref 1.7–7.7)
Neutrophils Relative %: 43 %
Platelets: 303 10*3/uL (ref 150–400)
RBC: 6.28 MIL/uL — ABNORMAL HIGH (ref 4.22–5.81)
RDW: 13.6 % (ref 11.5–15.5)
WBC: 11.9 10*3/uL — ABNORMAL HIGH (ref 4.0–10.5)
nRBC: 0 % (ref 0.0–0.2)

## 2019-12-20 MED ORDER — LACTATED RINGERS IV BOLUS
500.0000 mL | Freq: Once | INTRAVENOUS | Status: AC
  Administered 2019-12-20: 500 mL via INTRAVENOUS

## 2019-12-20 MED ORDER — HYDROMORPHONE HCL 1 MG/ML IJ SOLN
1.0000 mg | Freq: Once | INTRAMUSCULAR | Status: AC
  Administered 2019-12-20: 1 mg via INTRAVENOUS
  Filled 2019-12-20: qty 1

## 2019-12-20 NOTE — ED Notes (Signed)
Patient off unit, going for head CT scan

## 2019-12-20 NOTE — ED Provider Notes (Signed)
Emergency Department Provider Note   I have reviewed the triage vital signs and the nursing notes.   HISTORY  Chief Complaint Fall   HPI Roger Flores is a 59 y.o. male with multimedical problems documented below who presents the emergency department today after a fall.  Patient states he usually uses a walker and a wheel came off causing him to lose his balance and fall hurting his ankle, hitting his head and having a little bit of worsening back pain as well.  Patient states he is on hospice because he has 4 different types of cancer and multiple strokes and his doctor's do not expect to live much longer.   He states the hospice nurse is scheduled to come to his house tomorrow think he should build to get a new walker at that time.  His only complaint of ankle pain and back pain at this time.  He also request that we check a "panel of labs" because he does not feel quite right.  No other associated or modifying symptoms.    Past Medical History:  Diagnosis Date  . Anxiety   . Arthritis    knees and back  . Asthma   . Cancer ([place])    Larynx  . Cataract   . CHF (congestive heart failure) ([place])   . COPD (chronic obstructive pulmonary disease) ([place])   . Depression   . Diabetes mellitus without complication ([place])   . Emphysema of lung ([place])   . GERD (gastroesophageal reflux disease)   . Hyperlipidemia   . Hypertension   . Kidney disease   . Liver disease   . Lung mass   . Myocardial infarction ([place])   . Opioid withdrawal ([place])   . Oxygen deficiency   . Stroke ([place])   . Tobacco use     Patient Active Problem List   Diagnosis Date Noted  . Sepsis ([place]) 10/11/2017  . Cellulitis of leg, left 10/11/2017  . Chronic pain 07/24/2017  . Stroke ([place]) 07/21/2017  . History of cancer of larynx 07/21/2017  . Self-catheterizes urinary bladder 07/21/2017  . 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 DNR no code (do not resuscitate) 07/21/2017  . Insulin dependent  diabetes mellitus 07/21/2017  . Pressure injury of skin 07/19/2017  . Chest pain 07/18/2017  . COPD, very severe ([place]) 07/18/2017  . Unstable angina pectoris ([place]) 07/01/2017    Past Surgical History:  Procedure Laterality Date  . NECK SURGERY    . THROAT SURGERY      Current Outpatient Rx  . Order #: [id number] Class: Print  . Order #: [id number] Class: Print  . Order #: [id number] Class: Historical Med  . Order #: [id number] Class: Historical Med  . Order #: [id number] Class: Historical Med  . Order #: [id number] Class: Historical Med  . Order #: [id number] Class: Historical Med  . Order #: [id number] Class: Historical Med  . Order #: [id number] Class: Historical Med  . Order #: [id number] Class: Historical Med  . Order #: [id number] Class: Historical Med  . Order #: [id number] Class: Historical Med    Allergies Ibuprofen and Tylenol [acetaminophen]  Family History  Problem Relation Age of Onset  . Arthritis Mother   . Asthma Mother   . COPD Mother   . Depression Mother   . Hyperlipidemia Mother   . Hypertension Mother   . Miscarriages / [place] Mother   . Alzheimer's disease Mother   . Alcohol abuse Father   . Drug abuse Father   . Early death Father 2  gunshot  . Heart disease Brother 46  . Early death Son        SIDS  . Early death Son        MVA    Social History Social History   Tobacco Use  . Smoking status: Current Every Day Smoker    Packs/day: 1.00    Types: Cigarettes    Start date: 11/25/1977  . Smokeless tobacco: Never Used  Substance Use Topics  . Alcohol use: No  . Drug use: No    Review of Systems  All other systems negative except as documented in the HPI. All pertinent positives and negatives as reviewed in the HPI. ____________________________________________   PHYSICAL EXAM:  VITAL SIGNS: ED Triage Vitals  Enc Vitals Group     BP 12/19/19 2306 (!) 138/118     Pulse Rate 12/19/19 2306 96     Resp 12/19/19 2306 (!) 22     Temp  12/19/19 2306 97.9 F (36.6 C)     Temp Source 12/20/19 0019 Oral     SpO2 12/19/19 2306 99 %     Weight 12/19/19 2309 199 lb 15.3 oz (90.7 kg)     Height 12/19/19 2309 5\' 6"  (1.676 m)    Constitutional: Alert and oriented. Well appearing and in no acute distress. Eyes: Conjunctivae are normal. PERRL. EOMI. Head: Atraumatic. Nose: No congestion/rhinnorhea. Mouth/Throat: Mucous membranes are moist.  Oropharynx non-erythematous. Neck: No stridor.  No meningeal signs.   Cardiovascular: Normal rate, regular rhythm. Good peripheral circulation. Grossly normal heart sounds.   Respiratory: Normal respiratory effort.  No retractions. Lungs CTAB. Gastrointestinal: Soft and nontender. No distention.  Musculoskeletal: pain with ROM of l ankle and palpation of same. Neurologic:  Normal speech and language. L residual weakness.  Skin:  Skin is warm, dry and intact. No rash noted.  ____________________________________________   LABS (all labs ordered are listed, but only abnormal results are displayed)  Labs Reviewed  CBC WITH DIFFERENTIAL/PLATELET - Abnormal; Notable for the following components:      Result Value   WBC 11.9 (*)    RBC 6.28 (*)    Hemoglobin 17.8 (*)    HCT 54.7 (*)    Lymphs Abs 5.0 (*)    Monocytes Absolute 1.3 (*)    All other components within normal limits  BASIC METABOLIC PANEL - Abnormal; Notable for the following components:   Sodium 132 (*)    Chloride 93 (*)    Glucose, Bld 427 (*)    All other components within normal limits  URINALYSIS, ROUTINE W REFLEX MICROSCOPIC - Abnormal; Notable for the following components:   Color, Urine STRAW (*)    Specific Gravity, Urine 1.032 (*)    Glucose, UA >=500 (*)    Protein, ur 30 (*)    All other components within normal limits   ____________________________________________  EKG   EKG Interpretation  Date/Time:    Ventricular Rate:    PR Interval:    QRS Duration:   QT Interval:    QTC Calculation:   R  Axis:     Text Interpretation:         ____________________________________________  RADIOLOGY  DG Ankle Complete Left  Result Date: 12/20/2019 CLINICAL DATA:  Fall with left ankle pain EXAM: LEFT ANKLE COMPLETE - 3+ VIEW COMPARISON:  None. FINDINGS: Circumferential soft tissue swelling with trace ankle joint effusion. No acute fracture or traumatic malalignment is evident. The ankle mortise is congruent. Bidirectional calcaneal spurs are noted. IMPRESSION: Mild  circumferential swelling with trace effusion. No acute osseous abnormality. Electronically Signed   By: Lovena Le M.D.   On: 12/20/2019 00:48   CT Head Wo Contrast  Result Date: 12/19/2019 CLINICAL DATA:  Status post trauma. EXAM: CT HEAD WITHOUT CONTRAST TECHNIQUE: Contiguous axial images were obtained from the base of the skull through the vertex without intravenous contrast. COMPARISON:  None. FINDINGS: Brain: No evidence of acute infarction, hemorrhage, hydrocephalus, extra-axial collection or mass lesion/mass effect. Vascular: No hyperdense vessel or unexpected calcification. Skull: Normal. Negative for fracture or focal lesion. Sinuses/Orbits: No acute finding. Other: None. IMPRESSION: No acute intracranial pathology. Electronically Signed   By: Virgina Norfolk M.D.   On: 12/19/2019 23:54    ____________________________________________   INITIAL IMPRESSION / ASSESSMENT AND PLAN / ED COURSE  Ankle xr. Basic labs. Urine. Pain meds.likely dc.   Workup ok. Pain improved. Will dc. Has hospice/SW at home for further care.   Pertinent labs & imaging results that were available during my care of the patient were reviewed by me and considered in my medical decision making (see chart for details).  A medical screening exam was performed and I feel the patient has had an appropriate workup for their chief complaint at this time and likelihood of emergent condition existing is low. They have been counseled on decision, discharge,  follow up and which symptoms necessitate immediate return to the emergency department. They or their family verbally stated understanding and agreement with plan and discharged in stable condition.   ____________________________________________  FINAL CLINICAL IMPRESSION(S) / ED DIAGNOSES  Final diagnoses:  Fall, initial encounter    MEDICATIONS GIVEN DURING THIS VISIT:  Medications  HYDROmorphone (DILAUDID) injection 1 mg (1 mg Intravenous Given 12/20/19 0135)  lactated ringers bolus 500 mL (0 mLs Intravenous Stopped 12/20/19 0317)  HYDROmorphone (DILAUDID) injection 1 mg (1 mg Intravenous Given 12/20/19 0325)     NEW OUTPATIENT MEDICATIONS STARTED DURING THIS VISIT:  Discharge Medication List as of 12/20/2019  3:22 AM      Note:  This note was prepared with assistance of Dragon voice recognition software. Occasional wrong-word or sound-a-like substitutions may have occurred due to the inherent limitations of voice recognition software.   Cachet Mccutchen, Corene Cornea, MD 12/20/19 [phone]

## 2020-01-07 ENCOUNTER — Emergency Department (HOSPITAL_COMMUNITY): Payer: Medicare Other

## 2020-01-07 ENCOUNTER — Other Ambulatory Visit: Payer: Self-pay

## 2020-01-07 ENCOUNTER — Emergency Department (HOSPITAL_COMMUNITY)
Admission: EM | Admit: 2020-01-07 | Discharge: 2020-01-07 | Disposition: A | Discharge status: Home / Self Care | Payer: Medicare Other | Attending: Emergency Medicine | Admitting: Emergency Medicine

## 2020-01-07 ENCOUNTER — Encounter (HOSPITAL_COMMUNITY): Payer: Self-pay | Admitting: Emergency Medicine

## 2020-01-07 DIAGNOSIS — R1031 Right lower quadrant pain: Secondary | ICD-10-CM | POA: Diagnosis present

## 2020-01-07 DIAGNOSIS — I110 Hypertensive heart disease with heart failure: Secondary | ICD-10-CM | POA: Diagnosis not present

## 2020-01-07 DIAGNOSIS — I509 Heart failure, unspecified: Secondary | ICD-10-CM | POA: Diagnosis not present

## 2020-01-07 DIAGNOSIS — Z794 Long term (current) use of insulin: Secondary | ICD-10-CM | POA: Diagnosis not present

## 2020-01-07 DIAGNOSIS — B182 Chronic viral hepatitis C: Secondary | ICD-10-CM | POA: Diagnosis not present

## 2020-01-07 DIAGNOSIS — Z931 Gastrostomy status: Secondary | ICD-10-CM | POA: Insufficient documentation

## 2020-01-07 DIAGNOSIS — F17210 Nicotine dependence, cigarettes, uncomplicated: Secondary | ICD-10-CM | POA: Insufficient documentation

## 2020-01-07 DIAGNOSIS — Z8521 Personal history of malignant neoplasm of larynx: Secondary | ICD-10-CM | POA: Insufficient documentation

## 2020-01-07 DIAGNOSIS — E119 Type 2 diabetes mellitus without complications: Secondary | ICD-10-CM | POA: Insufficient documentation

## 2020-01-07 DIAGNOSIS — J449 Chronic obstructive pulmonary disease, unspecified: Secondary | ICD-10-CM | POA: Diagnosis not present

## 2020-01-07 DIAGNOSIS — Z79899 Other long term (current) drug therapy: Secondary | ICD-10-CM | POA: Insufficient documentation

## 2020-01-07 DIAGNOSIS — Z7901 Long term (current) use of anticoagulants: Secondary | ICD-10-CM | POA: Insufficient documentation

## 2020-01-07 DIAGNOSIS — I252 Old myocardial infarction: Secondary | ICD-10-CM | POA: Insufficient documentation

## 2020-01-07 LAB — URINALYSIS, ROUTINE W REFLEX MICROSCOPIC
Bacteria, UA: NONE SEEN
Bilirubin Urine: NEGATIVE
Glucose, UA: >=500 mg/dL — AB
Hgb urine dipstick: NEGATIVE
Ketones, ur: NEGATIVE mg/dL
Leukocytes,Ua: NEGATIVE
Nitrite: NEGATIVE
Protein, ur: 100 mg/dL — AB
Specific Gravity, Urine: 1.029 (ref 1.005–1.030)
pH: 7 (ref 5.0–8.0)

## 2020-01-07 LAB — COMPREHENSIVE METABOLIC PANEL
ALT: 22 U/L (ref 0–44)
AST: 19 U/L (ref 15–41)
Albumin: 3.6 g/dL (ref 3.5–5.0)
Alkaline Phosphatase: 75 U/L (ref 38–126)
Anion gap: 13 (ref 5–15)
BUN: 14 mg/dL (ref 6–20)
CO2: 26 mmol/L (ref 22–32)
Calcium: 9.8 mg/dL (ref 8.9–10.3)
Chloride: 96 mmol/L — ABNORMAL LOW (ref 98–111)
Creatinine, Ser: 0.76 mg/dL (ref 0.61–1.24)
Glucose, Bld: 391 mg/dL — ABNORMAL HIGH (ref 70–99)
Potassium: 4.5 mmol/L (ref 3.5–5.1)
Sodium: 135 mmol/L (ref 135–145)
Total Bilirubin: 0.5 mg/dL (ref 0.3–1.2)
Total Protein: 8.2 g/dL — ABNORMAL HIGH (ref 6.5–8.1)

## 2020-01-07 LAB — CBC
HCT: 50.8 % (ref 39.0–52.0)
Hemoglobin: 16.9 g/dL (ref 13.0–17.0)
MCH: 28.4 pg (ref 26.0–34.0)
MCHC: 33.3 g/dL (ref 30.0–36.0)
MCV: 85.2 fL (ref 80.0–100.0)
Platelets: 345 10*3/uL (ref 150–400)
RBC: 5.96 MIL/uL — ABNORMAL HIGH (ref 4.22–5.81)
RDW: 13.2 % (ref 11.5–15.5)
WBC: 10.9 10*3/uL — ABNORMAL HIGH (ref 4.0–10.5)
nRBC: 0 % (ref 0.0–0.2)

## 2020-01-07 LAB — LIPASE, BLOOD: Lipase: 40 U/L (ref 11–51)

## 2020-01-07 MED ORDER — SODIUM CHLORIDE 0.9% FLUSH: 3.0000 mL | Freq: Once | INTRAVENOUS | Status: DC

## 2020-01-07 MED ORDER — MORPHINE SULFATE (PF) 4 MG/ML IV SOLN
4.0000 mg | Freq: Once | INTRAVENOUS | Status: AC
  Administered 2020-01-07: 4 mg via INTRAVENOUS
  Filled 2020-01-07: qty 1

## 2020-01-07 MED ORDER — IOHEXOL 300 MG/ML  SOLN
100.0000 mL | Freq: Once | INTRAMUSCULAR | Status: AC | PRN
  Administered 2020-01-07: 100 mL via INTRAVENOUS

## 2020-01-07 MED ORDER — MORPHINE SULFATE (PF) 4 MG/ML IV SOLN
4.0000 mg | Freq: Once | INTRAVENOUS | Status: AC
  Administered 2020-01-07: 21:00:00 4 mg via INTRAVENOUS
  Filled 2020-01-07: qty 1

## 2020-01-07 MED ORDER — ONDANSETRON HCL 4 MG/2ML IJ SOLN
4.0000 mg | Freq: Once | INTRAMUSCULAR | Status: AC
  Administered 2020-01-07: 4 mg via INTRAVENOUS
  Filled 2020-01-07: qty 2

## 2020-01-07 MED ORDER — METOCLOPRAMIDE HCL 5 MG/ML IJ SOLN
10.0000 mg | Freq: Once | INTRAMUSCULAR | Status: AC
  Administered 2020-01-07: 23:00:00 10 mg via INTRAVENOUS
  Filled 2020-01-07: qty 2

## 2020-01-07 NOTE — Discharge Instructions (Addendum)
This CAT scan and blood work looks normal today.  Continue taking your pain medication at home as prescribed and follow-up with hospice if your symptoms are not improving.  If you start having a fever, vomiting, severe pain please return for repeat evaluation.

## 2020-01-07 NOTE — ED Triage Notes (Signed)
Pt c/o 10/10 lower right abd for the past 2 hours with nausea no vomiting.

## 2020-01-07 NOTE — ED Provider Notes (Signed)
Roger Flores Note   CSN: [id number] Arrival date & time: 01/07/20  1955     History Chief Complaint  Patient presents with  . Abdominal Pain    Roger Flores is a 59 y.o. male.  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  Patient states he has been nauseated all day but the pain started approximately 2 hours prior to arrival and is now an 8 out of 10 and sharp in nature.  Nothing really seems to make it worse and it is making the nausea worse.  He has had no change in bowel movements or urinary habits.  He denies fever, cough, congestion or shortness of breath.  Patient does take oxycodone 15 mg multiple times a day and last had a dose at 6 PM but states it has not helped his pain.  He does not recall having pain like this in the past.  Patient does report that he is starting hospice soon and they are planning on increasing his pain medications.  The history is provided by the patient.  Abdominal Pain Pain location:  RLQ Pain quality: cramping, gnawing, sharp and shooting   Pain radiates to:  Does not radiate Pain severity:  Severe Onset quality:  Sudden Duration:  2 hours Timing:  Intermittent Progression:  Worsening Chronicity:  New Context comment:  Started spontaneously Worsened by:  Nothing Ineffective treatments:  None tried Associated symptoms: anorexia and nausea   Associated symptoms: no chest pain, no chills, no constipation, no cough, no diarrhea, no fever, no hematuria, no shortness of breath and no vomiting        Past Medical History:  Diagnosis Date  . Anxiety   . Arthritis    knees and back  . Asthma   . Cancer ([place])    Larynx  .  Cataract   . CHF (congestive heart failure) ([place])   . COPD (chronic obstructive pulmonary disease) ([place])   . Depression   . Diabetes mellitus without complication ([place])   . Emphysema of lung ([place])   . GERD (gastroesophageal reflux disease)   . Hyperlipidemia   . Hypertension   . Kidney disease   . Liver disease   . Lung mass   . Myocardial infarction ([place])   . Opioid withdrawal ([place])   . Oxygen deficiency   . Stroke ([place])   . Tobacco use     Patient Active Problem List   Diagnosis Date Noted  . Sepsis ([place]) 10/11/2017  . Cellulitis of leg, left 10/11/2017  . Chronic pain 07/24/2017  . Stroke ([place]) 07/21/2017  . History of cancer of larynx 07/21/2017  . Self-catheterizes urinary bladder 07/21/2017  . 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 DNR no code (do not resuscitate) 07/21/2017  . Insulin dependent diabetes mellitus 07/21/2017  . Pressure injury of skin 07/19/2017  . Chest pain 07/18/2017  . COPD, very severe ([place]) 07/18/2017  . Unstable angina pectoris ([place]) 07/01/2017    Past Surgical History:  Procedure Laterality Date  . NECK SURGERY    . THROAT SURGERY         Family History  Problem Relation Age of Onset  . Arthritis Mother   . Asthma Mother   .  COPD Mother   . Depression Mother   . Hyperlipidemia Mother   . Hypertension Mother   . Miscarriages / [place] Mother   . Alzheimer's disease Mother   . Alcohol abuse Father   . Drug abuse Father   . Early death Father 69       gunshot  . Heart disease Brother 54  . Early death Son        SIDS  . Early death Son        MVA    Social History   Tobacco Use  . Smoking status: Current Every Day Smoker    Packs/day: 1.00    Types: Cigarettes    Start date: 11/25/1977  . Smokeless tobacco: Never Used  Substance Use Topics  . Alcohol use: No  . Drug use: No    Home Medications Prior to Admission medications   Medication Sig Start Date End Date Taking? Authorizing Flores    albuterol (PROVENTIL) (2.5 MG/3ML) 0.083% nebulizer solution Take 3 mLs (2.5 mg total) by nebulization every 6 (six) hours as needed for wheezing or shortness of breath. Patient taking differently: Take 2.5 mg by nebulization every 4 (four) hours.  07/20/17   Kathie Dike, MD  apixaban (ELIQUIS) 5 MG TABS tablet Take 1 tablet (5 mg total) by mouth 2 (two) times daily. 07/04/17   Jola Schmidt, MD  feeding supplement, GLUCERNA SHAKE, (GLUCERNA SHAKE) LIQD Take 237 mLs 5 (five) times daily by mouth.     Flores, Historical, MD  furosemide (LASIX) 40 MG tablet Take 60 mg by mouth 2 (two) times daily as needed for fluid.     Flores, Historical, MD  insulin NPH Human (HUMULIN N,NOVOLIN N) 100 UNIT/ML injection Inject 65 Units into the skin 2 (two) times daily before a meal.     Flores, Historical, MD  insulin regular (NOVOLIN R,HUMULIN R) 100 units/mL injection Inject 75 Units into the skin in the morning, at noon, in the evening, and at bedtime.     Flores, Historical, MD  metoprolol succinate (TOPROL-XL) 25 MG 24 hr tablet Take 25 mg by mouth daily. 01/20/18   Flores, Historical, MD  nitroGLYCERIN (NITROSTAT) 0.4 MG SL tablet Place 0.4 mg under the tongue every 5 (five) minutes as needed for chest pain.  05/23/17   Flores, Historical, MD  oxyCODONE (ROXICODONE) 15 MG immediate release tablet Take 15 mg by mouth 3 (three) times daily as needed for pain.  12/07/19   Flores, Historical, MD  OXYGEN Inhale 3 L continuous into the lungs. At home     Flores, Historical, MD  prochlorperazine (COMPAZINE) 5 MG tablet Take 5 mg by mouth every 6 (six) hours as needed for nausea or vomiting.    Flores, Historical, MD  tamsulosin (FLOMAX) 0.4 MG CAPS capsule Take 0.4 mg daily after breakfast by mouth.     Flores, Historical, MD    Allergies    Ibuprofen and Tylenol [acetaminophen]  Review of Systems   Review of Systems  Constitutional: Negative for chills and fever.  Respiratory: Negative for  cough and shortness of breath.   Cardiovascular: Negative for chest pain.  Gastrointestinal: Positive for abdominal pain, anorexia and nausea. Negative for constipation, diarrhea and vomiting.  Genitourinary: Negative for hematuria.  All other systems reviewed and are negative.   Physical Exam Updated Vital Signs BP (!) 132/94 (BP Location: Right Arm)   Pulse (!) 115   Temp 98.6 F (37 C) (Oral)   Resp (!) 36   Ht  5\' 6"  (1.676 m)   Wt 93.4 kg   SpO2 100%   BMI 33.25 kg/m   Physical Exam Vitals and nursing note reviewed.  Constitutional:      General: He is in acute distress.     Appearance: He is well-developed. He is obese.     Comments: Appears uncomfortable  HENT:     Head: Normocephalic and atraumatic.  Eyes:     Conjunctiva/sclera: Conjunctivae normal.     Pupils: Pupils are equal, round, and reactive to light.  Cardiovascular:     Rate and Rhythm: Regular rhythm. Tachycardia present.     Heart sounds: No murmur.  Pulmonary:     Effort: Pulmonary effort is normal. No respiratory distress.     Breath sounds: Normal breath sounds. No wheezing or rales.  Abdominal:     General: Bowel sounds are normal. There is distension.     Palpations: Abdomen is soft.     Tenderness: There is abdominal tenderness in the right lower quadrant. There is no right CVA tenderness, guarding or rebound.     Hernia: No hernia is present.     Comments: Ascites present and abdomen is distended and tight  Musculoskeletal:        General: No tenderness. Normal range of motion.     Cervical back: Normal range of motion and neck supple.  Skin:    General: Skin is warm and dry.     Findings: No erythema or rash.  Neurological:     Mental Status: He is alert and oriented to person, place, and time.  Psychiatric:        Behavior: Behavior normal.     ED Results / Procedures / Treatments   Labs (all labs ordered are listed, but only abnormal results are displayed) Labs Reviewed    COMPREHENSIVE METABOLIC PANEL - Abnormal; Notable for the following components:      Result Value   Chloride 96 (*)    Glucose, Bld 391 (*)    Total Protein 8.2 (*)    All other components within normal limits  CBC - Abnormal; Notable for the following components:   WBC 10.9 (*)    RBC 5.96 (*)    All other components within normal limits  URINALYSIS, ROUTINE W REFLEX MICROSCOPIC - Abnormal; Notable for the following components:   Color, Urine STRAW (*)    Glucose, UA >=500 (*)    Protein, ur 100 (*)    All other components within normal limits  LIPASE, BLOOD    EKG None  Radiology CT ABDOMEN PELVIS W CONTRAST  Result Date: 01/07/2020 CLINICAL DATA:  Right lower quadrant pain, nausea, vomiting and change in bowel movements. History of hepatitis C with cirrhosis. Recent G-tube placement with minor bleeding. EXAM: CT ABDOMEN AND PELVIS WITH CONTRAST TECHNIQUE: Multidetector CT imaging of the abdomen and pelvis was performed using the standard protocol following bolus administration of intravenous contrast. CONTRAST:  165mL OMNIPAQUE IOHEXOL 300 MG/ML  SOLN COMPARISON:  None. FINDINGS: Lower chest: Lung bases are clear. Normal heart size. No pericardial effusion. Hepatobiliary: Nodular hepatic surface contour without focal liver lesion. Normal hepatic attenuation. Patient is post cholecystectomy. Slight prominence of the biliary tree is likely related to reservoir effect post cholecystectomy. No calcified intraductal gallstones are seen. Pancreas: Unremarkable. No pancreatic ductal dilatation or surrounding inflammatory changes. Spleen: Normal in size without focal abnormality. Adrenals/Urinary Tract: Normal adrenal glands. Kidneys enhance and excrete symmetrically. No worrisome renal lesions. No urolithiasis or hydronephrosis. Urinary bladder  is unremarkable. Stomach/Bowel: Distal esophagus is unremarkable. A percutaneous gastrostomy tube is noted in the anterior abdomen with appropriate  positioning of the inflated balloon within the gastric lumen. There is evidence of prior percutaneous tube placement lateral to the current site. No small bowel dilatation or wall thickening. A normal appendix is visualized. No colonic dilatation or wall thickening. No evidence of obstruction. Vascular/Lymphatic: Calcifications within the abdominal aorta and branch vessels. Major venous structures are unremarkable. Reproductive: Coarse eccentric calcification of the prostate. No concerning abnormalities of the prostate or seminal vesicles. Other: No abdominopelvic free fluid or free gas. No bowel containing hernias. Musculoskeletal: No acute osseous abnormality or suspicious osseous lesion. IMPRESSION: 1. No acute abnormalities identified to explain the patient's right lower quadrant pain. Specifically, the appendix is normal. 2. Nodular hepatic surface contour may represent underlying cirrhosis. No focal liver lesion. 3. Prior cholecystectomy with slight prominence of the biliary tree likely related to reservoir effect post cholecystectomy. 4. Appropriately positioned percutaneous gastrostomy tube. There is evidence of prior percutaneous tube placement lateral to the current site. 5. Aortic Atherosclerosis (ICD10-I70.0). Electronically Signed   By: Lovena Le M.D.   On: 01/07/2020 22:21    Procedures Procedures (including critical care time)  Medications Ordered in ED Medications  sodium chloride flush (NS) 0.9 % injection 3 mL (has no administration in time range)  morphine 4 MG/ML injection 4 mg (has no administration in time range)  ondansetron (ZOFRAN) injection 4 mg (has no administration in time range)    ED Course  I have reviewed the triage vital signs and the nursing notes.  Pertinent labs & imaging results that were available during my care of the patient were reviewed by me and considered in my medical decision making (see chart for details).    MDM Rules/Calculators/A&P                       Patient with multiple medical problems presenting today with sudden onset of abdominal pain 2 hours ago.  Patient is hemodynamically stable but appears uncomfortable.  Mild tenderness in the right lower quadrant but no significant rebound or guarding.  Concern for bowel obstruction versus kidney stone versus cholelithiasis.  Patient has known liver disease and hepatitis C which she has taken medication to eradicate.  No findings consistent with SBP.  Patient denies any infectious symptoms lower suspicion for lung or heart pathology.  Low suspicion for Covid at this time.  Patient denies any urinary symptoms.  Lipase is within normal limits, CMP with hyperglycemia but no other acute findings and LFTs are within normal limits.  CBC with white count of 10,000 and normal hemoglobin.  UA with glucose and protein but no other acute findings.  Patient given pain control.  Will do CT to further evaluate.  11:04 PM Patient's labs are reassuring with normal renal function, normal LFTs and lipase.  CBC with leukocytosis of 10,000 and stable hemoglobin.  CT shows no acute abnormalities to explain patient's pain.  The appendix is normal.  Known cirrhosis with no focal liver lesions.  Prior cholecystectomy but no other acute issues.  Patient's gastrostomy tube is in appropriate location.  On repeat evaluation patient's pain is better but is requesting another dose of pain medication.  Patient otherwise appears stable for discharge.  He does have pain medicine at home he is following up with hospice and he was given strict return precautions.  Low suspicion at this time for cardiac or lung pathology.  No evidence of UTI.  Final Clinical Impression(s) / ED Diagnoses Final diagnoses:  Right lower quadrant abdominal pain    Rx / DC Orders ED Discharge Orders    None       Blanchie Dessert, MD 01/07/20 2308

## 2020-01-20 ENCOUNTER — Other Ambulatory Visit: Payer: Self-pay

## 2020-01-20 ENCOUNTER — Emergency Department (HOSPITAL_COMMUNITY)
Admission: EM | Admit: 2020-01-20 | Discharge: 2020-01-21 | Disposition: A | Discharge status: Home / Self Care | Payer: Medicare Other | Attending: Emergency Medicine | Admitting: Emergency Medicine

## 2020-01-20 ENCOUNTER — Emergency Department (HOSPITAL_COMMUNITY): Payer: Medicare Other

## 2020-01-20 ENCOUNTER — Encounter (HOSPITAL_COMMUNITY): Payer: Self-pay | Admitting: Emergency Medicine

## 2020-01-20 DIAGNOSIS — F17210 Nicotine dependence, cigarettes, uncomplicated: Secondary | ICD-10-CM | POA: Insufficient documentation

## 2020-01-20 DIAGNOSIS — R0789 Other chest pain: Secondary | ICD-10-CM | POA: Diagnosis not present

## 2020-01-20 DIAGNOSIS — I252 Old myocardial infarction: Secondary | ICD-10-CM | POA: Insufficient documentation

## 2020-01-20 DIAGNOSIS — E119 Type 2 diabetes mellitus without complications: Secondary | ICD-10-CM | POA: Insufficient documentation

## 2020-01-20 DIAGNOSIS — Y9301 Activity, walking, marching and hiking: Secondary | ICD-10-CM | POA: Diagnosis not present

## 2020-01-20 DIAGNOSIS — Z794 Long term (current) use of insulin: Secondary | ICD-10-CM | POA: Diagnosis not present

## 2020-01-20 DIAGNOSIS — I509 Heart failure, unspecified: Secondary | ICD-10-CM | POA: Insufficient documentation

## 2020-01-20 DIAGNOSIS — Z7901 Long term (current) use of anticoagulants: Secondary | ICD-10-CM | POA: Insufficient documentation

## 2020-01-20 DIAGNOSIS — W19XXXA Unspecified fall, initial encounter: Secondary | ICD-10-CM

## 2020-01-20 DIAGNOSIS — R519 Headache, unspecified: Secondary | ICD-10-CM | POA: Diagnosis not present

## 2020-01-20 DIAGNOSIS — J449 Chronic obstructive pulmonary disease, unspecified: Secondary | ICD-10-CM | POA: Insufficient documentation

## 2020-01-20 DIAGNOSIS — I110 Hypertensive heart disease with heart failure: Secondary | ICD-10-CM | POA: Insufficient documentation

## 2020-01-20 DIAGNOSIS — Y929 Unspecified place or not applicable: Secondary | ICD-10-CM | POA: Insufficient documentation

## 2020-01-20 DIAGNOSIS — R0781 Pleurodynia: Secondary | ICD-10-CM

## 2020-01-20 DIAGNOSIS — W109XXA Fall (on) (from) unspecified stairs and steps, initial encounter: Secondary | ICD-10-CM | POA: Insufficient documentation

## 2020-01-20 DIAGNOSIS — Y999 Unspecified external cause status: Secondary | ICD-10-CM | POA: Insufficient documentation

## 2020-01-20 LAB — CBC WITH DIFFERENTIAL/PLATELET
Abs Immature Granulocytes: 0.02 10*3/uL (ref 0.00–0.07)
Basophils Absolute: 0.1 10*3/uL (ref 0.0–0.1)
Basophils Relative: 1 %
Eosinophils Absolute: 0.3 10*3/uL (ref 0.0–0.5)
Eosinophils Relative: 4 %
HCT: 48 % (ref 39.0–52.0)
Hemoglobin: 15.9 g/dL (ref 13.0–17.0)
Immature Granulocytes: 0 %
Lymphocytes Relative: 40 %
Lymphs Abs: 3 10*3/uL (ref 0.7–4.0)
MCH: 28.9 pg (ref 26.0–34.0)
MCHC: 33.1 g/dL (ref 30.0–36.0)
MCV: 87.3 fL (ref 80.0–100.0)
Monocytes Absolute: 1.1 10*3/uL — ABNORMAL HIGH (ref 0.1–1.0)
Monocytes Relative: 15 %
Neutro Abs: 3.1 10*3/uL (ref 1.7–7.7)
Neutrophils Relative %: 40 %
Platelets: 242 10*3/uL (ref 150–400)
RBC: 5.5 MIL/uL (ref 4.22–5.81)
RDW: 13.2 % (ref 11.5–15.5)
WBC: 7.6 10*3/uL (ref 4.0–10.5)
nRBC: 0 % (ref 0.0–0.2)

## 2020-01-20 LAB — PROTIME-INR
INR: 0.9 (ref 0.8–1.2)
Prothrombin Time: 12.2 seconds (ref 11.4–15.2)

## 2020-01-20 MED ORDER — GLUCAGON (RDNA) 1 MG IJ KIT
1.00 | PACK | INTRAMUSCULAR | Status: DC
Start: ? — End: 2020-01-20

## 2020-01-20 MED ORDER — INSULIN REGULAR HUMAN 100 UNIT/ML IJ SOLN
75.00 | INTRAMUSCULAR | Status: DC
Start: ? — End: 2020-01-20

## 2020-01-20 MED ORDER — DEXTROMETHORPHAN-GUAIFENESIN 10-100 MG/5ML PO SYRP
10.00 | ORAL_SOLUTION | ORAL | Status: DC
Start: ? — End: 2020-01-20

## 2020-01-20 MED ORDER — POLYVINYL ALCOHOL 1.4 % OP SOLN
1.00 | OPHTHALMIC | Status: DC
Start: ? — End: 2020-01-20

## 2020-01-20 MED ORDER — TAMSULOSIN HCL 0.4 MG PO CAPS
0.40 | ORAL_CAPSULE | ORAL | Status: DC
Start: 2020-01-20 — End: 2020-01-20

## 2020-01-20 MED ORDER — COLESTIPOL HCL 1 G PO TABS
1.00 | ORAL_TABLET | ORAL | Status: DC
Start: ? — End: 2020-01-20

## 2020-01-20 MED ORDER — BISACODYL 5 MG PO TBEC
5.00 | DELAYED_RELEASE_TABLET | ORAL | Status: DC
Start: ? — End: 2020-01-20

## 2020-01-20 MED ORDER — INSULIN NPH (HUMAN) (ISOPHANE) 100 UNIT/ML ~~LOC~~ SUSP
65.00 | SUBCUTANEOUS | Status: DC
Start: 2020-01-20 — End: 2020-01-20

## 2020-01-20 MED ORDER — OXYCODONE HCL 5 MG PO TABS
10.00 | ORAL_TABLET | ORAL | Status: DC
Start: ? — End: 2020-01-20

## 2020-01-20 MED ORDER — POLYETHYLENE GLYCOL 3350 17 GM/SCOOP PO POWD
17.00 | ORAL | Status: DC
Start: ? — End: 2020-01-20

## 2020-01-20 MED ORDER — GLUCOSE 40 % PO GEL
ORAL | Status: DC
Start: ? — End: 2020-01-20

## 2020-01-20 MED ORDER — LOSARTAN POTASSIUM 50 MG PO TABS
100.00 | ORAL_TABLET | ORAL | Status: DC
Start: 2020-01-20 — End: 2020-01-20

## 2020-01-20 MED ORDER — GENERIC EXTERNAL MEDICATION
4.50 | Status: DC
Start: 2020-01-20 — End: 2020-01-20

## 2020-01-20 MED ORDER — APIXABAN 5 MG PO TABS
5.00 | ORAL_TABLET | ORAL | Status: DC
Start: 2020-01-20 — End: 2020-01-20

## 2020-01-20 MED ORDER — RISA-BID PROBIOTIC PO TABS
2.00 | ORAL_TABLET | ORAL | Status: DC
Start: 2020-01-20 — End: 2020-01-20

## 2020-01-20 MED ORDER — ALBUTEROL SULFATE (2.5 MG/3ML) 0.083% IN NEBU
2.50 | INHALATION_SOLUTION | RESPIRATORY_TRACT | Status: DC
Start: ? — End: 2020-01-20

## 2020-01-20 MED ORDER — DEXTROSE 50 % IV SOLN
50.00 | INTRAVENOUS | Status: DC
Start: ? — End: 2020-01-20

## 2020-01-20 MED ORDER — METOPROLOL SUCCINATE ER 25 MG PO TB24
25.00 | ORAL_TABLET | ORAL | Status: DC
Start: 2020-01-20 — End: 2020-01-20

## 2020-01-20 MED ORDER — ONDANSETRON 4 MG PO TBDP
4.0000 mg | ORAL_TABLET | Freq: Once | ORAL | Status: AC
  Administered 2020-01-20: 4 mg via ORAL
  Filled 2020-01-20: qty 1

## 2020-01-20 MED ORDER — GLUCOSE-VITAMIN C 4-6 GM-MG PO CHEW
12.00 | CHEWABLE_TABLET | ORAL | Status: DC
Start: ? — End: 2020-01-20

## 2020-01-20 MED ORDER — ATORVASTATIN CALCIUM 40 MG PO TABS
40.00 | ORAL_TABLET | ORAL | Status: DC
Start: 2020-01-20 — End: 2020-01-20

## 2020-01-20 MED ORDER — NICOTINE 14 MG/24HR TD PT24
1.00 | MEDICATED_PATCH | TRANSDERMAL | Status: DC
Start: ? — End: 2020-01-20

## 2020-01-20 MED ORDER — SENNOSIDES 8.6 MG PO TABS
1.00 | ORAL_TABLET | ORAL | Status: DC
Start: ? — End: 2020-01-20

## 2020-01-20 MED ORDER — ONDANSETRON HCL 4 MG/2ML IJ SOLN
4.00 | INTRAMUSCULAR | Status: DC
Start: ? — End: 2020-01-20

## 2020-01-20 MED ORDER — PROCHLORPERAZINE EDISYLATE 10 MG/2ML IJ SOLN
10.00 | INTRAMUSCULAR | Status: DC
Start: ? — End: 2020-01-20

## 2020-01-20 MED ORDER — NITROGLYCERIN 0.4 MG SL SUBL
0.40 | SUBLINGUAL_TABLET | SUBLINGUAL | Status: DC
Start: ? — End: 2020-01-20

## 2020-01-20 MED ORDER — SODIUM CHLORIDE 0.9 % IV SOLN
125.00 | INTRAVENOUS | Status: DC
Start: ? — End: 2020-01-20

## 2020-01-20 MED ORDER — BENZONATATE 100 MG PO CAPS
100.00 | ORAL_CAPSULE | ORAL | Status: DC
Start: ? — End: 2020-01-20

## 2020-01-20 MED ORDER — HYDROMORPHONE HCL 1 MG/ML IJ SOLN
0.50 | INTRAMUSCULAR | Status: DC
Start: ? — End: 2020-01-20

## 2020-01-20 MED ORDER — MELATONIN 3 MG PO TABS
3.00 | ORAL_TABLET | ORAL | Status: DC
Start: ? — End: 2020-01-20

## 2020-01-20 NOTE — ED Triage Notes (Addendum)
Patient lost his balance and fell at home this evening , hit his head against the stair steps , denies LOC/alert and oriented , reports right side headache and right lateral ribcage pain . He is taking Eliquis.

## 2020-01-21 LAB — COMPREHENSIVE METABOLIC PANEL
ALT: 28 U/L (ref 0–44)
AST: 18 U/L (ref 15–41)
Albumin: 3.2 g/dL — ABNORMAL LOW (ref 3.5–5.0)
Alkaline Phosphatase: 76 U/L (ref 38–126)
Anion gap: 9 (ref 5–15)
BUN: 15 mg/dL (ref 6–20)
CO2: 26 mmol/L (ref 22–32)
Calcium: 9.2 mg/dL (ref 8.9–10.3)
Chloride: 99 mmol/L (ref 98–111)
Creatinine, Ser: 0.8 mg/dL (ref 0.61–1.24)
GFR calc Af Amer: >60 mL/min (ref >60)
GFR calc non Af Amer: >60 mL/min (ref >60)
Glucose, Bld: 362 mg/dL — ABNORMAL HIGH (ref 70–99)
Potassium: 4.4 mmol/L (ref 3.5–5.1)
Sodium: 134 mmol/L — ABNORMAL LOW (ref 135–145)
Total Bilirubin: <0.1 mg/dL — ABNORMAL LOW (ref 0.3–1.2)
Total Protein: 7.5 g/dL (ref 6.5–8.1)

## 2020-01-21 MED ORDER — HYDROMORPHONE HCL 1 MG/ML IJ SOLN
1.0000 mg | Freq: Once | INTRAMUSCULAR | Status: AC
  Administered 2020-01-21: 1 mg via INTRAMUSCULAR
  Filled 2020-01-21: qty 1

## 2020-01-21 NOTE — ED Provider Notes (Signed)
[facility] EMERGENCY DEPARTMENT Provider Note   CSN: [id number] Arrival date & time: 01/20/20  2215     History Chief Complaint  Patient presents with  . Fall    Headache/Eliquis    Roger Flores is a 59 y.o. male.  The history is provided by medical records and the patient.  Fall Associated symptoms include chest pain (right ribs).    59 year old male with history of anxiety, arthritis, asthma, CHF, COPD, depression, diabetes, emphysema, hyperlipidemia, hypertension, prior stroke with residual left-sided deficits, presenting to the ED after a fall.  Patient states he was going up the stairs without his walker when he lost his footing and fell onto his right side.  He hit his right ribs on a step in his head on the wall.  There was no loss of consciousness.  States he feels some tenderness along his scalp but his biggest complaint is right rib pain.  Pain in the rib is worse with movement and deep breathing.  He denies any chest pain.  He is on Eliquis.  He is on chronic 2L O2.  Past Medical History:  Diagnosis Date  . Anxiety   . Arthritis    knees and back  . Asthma   . Cancer ([place])    Larynx  . Cataract   . CHF (congestive heart failure) ([place])   . COPD (chronic obstructive pulmonary disease) ([place])   . Depression   . Diabetes mellitus without complication ([place])   . Emphysema of lung ([place])   . GERD (gastroesophageal reflux disease)   . Hyperlipidemia   . Hypertension   . Kidney disease   . Liver disease   . Lung mass   . Myocardial infarction ([place])   . Opioid withdrawal ([place])   . Oxygen deficiency   . Stroke ([place])   . Tobacco use     Patient Active Problem List   Diagnosis Date Noted  . Sepsis ([place]) 10/11/2017  . Cellulitis of leg, left 10/11/2017  . Chronic pain 07/24/2017  . Stroke ([place]) 07/21/2017  . History of cancer of larynx 07/21/2017  . Self-catheterizes urinary bladder 07/21/2017  . 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 DNR no code (do not resuscitate) 07/21/2017  . Insulin dependent diabetes mellitus 07/21/2017  . Pressure injury of skin 07/19/2017  . Chest pain 07/18/2017  . COPD, very severe ([place]) 07/18/2017  . Unstable angina pectoris ([place]) 07/01/2017    Past Surgical History:  Procedure Laterality Date  . NECK SURGERY    . THROAT SURGERY         Family History  Problem Relation Age of Onset  . Arthritis Mother   . Asthma Mother   . COPD Mother   . Depression Mother   . Hyperlipidemia Mother   . Hypertension Mother   . Miscarriages / [place] Mother   . Alzheimer's disease Mother   . Alcohol abuse Father   . Drug abuse Father   . Early death Father 37       gunshot  . Heart disease Brother 82  . Early death Son        SIDS  . Early death Son        MVA    Social History   Tobacco Use  . Smoking status: Current Every Day Smoker    Packs/day: 1.00    Types: Cigarettes    Start date: 11/25/1977  . Smokeless tobacco: Never Used  Substance Use Topics  .  Alcohol use: No  . Drug use: No    Home Medications Prior to Admission medications   Medication Sig Start Date End Date Taking? Authorizing Provider  albuterol (PROVENTIL) (2.5 MG/3ML) 0.083% nebulizer solution Take 3 mLs (2.5 mg total) by nebulization every 6 (six) hours as needed for wheezing or shortness of breath. Patient taking differently: Take 2.5 mg by nebulization every 4 (four) hours.  07/20/17   Kathie Dike, MD  apixaban (ELIQUIS) 5 MG TABS tablet Take 1 tablet (5 mg total) by mouth 2 (two) times daily. 07/04/17   Jola Schmidt, MD  feeding supplement, GLUCERNA SHAKE, (GLUCERNA SHAKE) LIQD Take 237 mLs 5 (five) times daily by mouth.     [provider]  furosemide (LASIX) 40 MG tablet Take 60 mg by mouth 2 (two) times daily as needed for fluid.     [provider]  insulin NPH Human (HUMULIN N,NOVOLIN N) 100 UNIT/ML injection Inject 65 Units into the skin 2 (two) times daily  before a meal.     [provider]  insulin regular (NOVOLIN R,HUMULIN R) 100 units/mL injection Inject 75 Units into the skin in the morning, at noon, in the evening, and at bedtime.     [provider]  metoprolol succinate (TOPROL-XL) 25 MG 24 hr tablet Take 25 mg by mouth daily. 01/20/18   [provider]  nitroGLYCERIN (NITROSTAT) 0.4 MG SL tablet Place 0.4 mg under the tongue every 5 (five) minutes as needed for chest pain.  05/23/17   [provider]  oxyCODONE (ROXICODONE) 15 MG immediate release tablet Take 15 mg by mouth 3 (three) times daily as needed for pain.  12/07/19   [provider]  OXYGEN Inhale 3 L continuous into the lungs. At home     [provider]  prochlorperazine (COMPAZINE) 5 MG tablet Take 5 mg by mouth every 6 (six) hours as needed for nausea or vomiting.    [provider]  tamsulosin (FLOMAX) 0.4 MG CAPS capsule Take 0.4 mg daily after breakfast by mouth.     [provider]    Allergies    Ibuprofen and Tylenol [acetaminophen]  Review of Systems   Review of Systems  Cardiovascular: Positive for chest pain (right ribs).  All other systems reviewed and are negative.   Physical Exam Updated Vital Signs BP (!) 150/90 (BP Location: Right Arm)   Pulse 98   Temp 97.8 F (36.6 C) (Oral)   Resp (!) 22   Ht 5\' 6"  (1.676 m)   Wt 100 kg   SpO2 99%   BMI 35.58 kg/m   Physical Exam Vitals and nursing note reviewed.  Constitutional:      Appearance: He is well-developed.  HENT:     Head: Normocephalic and atraumatic.     Comments: Reports pain to right parietal scalp, no hematoma, contusion, skull depression, or open wound noted Mid-face stable Eyes:     Conjunctiva/sclera: Conjunctivae normal.     Pupils: Pupils are equal, round, and reactive to light.  Neck:     Comments: Full ROM, no tenderness or deformity Cardiovascular:     Rate and Rhythm: Normal rate and regular rhythm.      Heart sounds: Normal heart sounds.  Pulmonary:     Effort: Pulmonary effort is normal.     Breath sounds: Normal breath sounds.     Comments: Lungs CTAB, baseline 2L O2 in use without distress Chest:     Comments: Right 8th-9th ribs are  TTP without noted deformity or bruising, no crepitus Abdominal:     General: Bowel sounds are normal.     Palpations: Abdomen is soft.     Tenderness: There is no abdominal tenderness.     Comments: Feeding tube in place, site is clean without signs of infection  Musculoskeletal:        General: Normal range of motion.     Cervical back: Full passive range of motion without pain, normal range of motion and neck supple.  Skin:    General: Skin is warm and dry.  Neurological:     Mental Status: He is alert and oriented to person, place, and time.     Comments: Awake, alert, fully oriented to baseline, overall moving arms and legs well, some difficulty moving left leg which is chronic from prior stroke, speech is clear and goal oriented     ED Results / Procedures / Treatments   Labs (all labs ordered are listed, but only abnormal results are displayed) Labs Reviewed  CBC WITH DIFFERENTIAL/PLATELET - Abnormal; Notable for the following components:      Result Value   Monocytes Absolute 1.1 (*)    All other components within normal limits  COMPREHENSIVE METABOLIC PANEL - Abnormal; Notable for the following components:   Sodium 134 (*)    Glucose, Bld 362 (*)    Albumin 3.2 (*)    Total Bilirubin <0.1 (*)    All other components within normal limits  PROTIME-INR    EKG EKG Interpretation  Date/Time:  Thursday January 20 2020 22:19:58 EST Ventricular Rate:  96 PR Interval:  136 QRS Duration: 130 QT Interval:  360 QTC Calculation: 101 R Axis:   63 Text Interpretation: Normal sinus rhythm Right bundle branch block Abnormal ECG When compared with ECG of 05/19/2018, HEART RATE has decreased Confirmed by Delora Fuel ([id number]) on 01/20/2020  11:36:47 PM   Radiology DG Ribs Unilateral W/Chest Right  Result Date: 01/20/2020 CLINICAL DATA:  Fall, pain, right lateral rib pain after fall stairs EXAM: RIGHT RIBS AND CHEST - 3+ VIEW COMPARISON:  Radiograph 05/19/2018 FINDINGS: Radiopaque BB marker is positioned along the right lateral chest wall about the level of the eighth interspace. No fracture or other bone lesions are seen involving the ribs. There is no evidence of pneumothorax or pleural effusion. Bandlike opacity in the left lung base is likely scarring. More hazy and streaky opacities in bilateral bases are compatible with atelectasis likely related to splinting secondary to chest pain. The cardiomediastinal contours are unremarkable. IMPRESSION: No evidence of right rib fracture. Bibasilar atelectasis likely related to splinting secondary to chest pain. Electronically Signed   By: Lovena Le M.D.   On: 01/20/2020 23:13   CT Head Wo Contrast  Result Date: 01/20/2020 CLINICAL DATA:  Headache and head trauma. EXAM: CT HEAD WITHOUT CONTRAST TECHNIQUE: Contiguous axial images were obtained from the base of the skull through the vertex without intravenous contrast. COMPARISON:  December 19, 2019. FINDINGS: CT Head: Brain: No acute intracranial hemorrhage. No apparent intracranial edema or sulcal effacement. No evidence for an acute large-vessel territory infarction. No midline shift or mass effect. Normal bilateral cerebral volume. CSF spaces: Normal intracranial ventricular system without hydrocephalus. No abnormal extra-axial fluid collection. Vascular: No hyperdense vessel. Paucity of intracranial calcified atherosclerosis. Skull: No acute abnormality of the scalp soft tissues. Intact calvarium. Sinuses/Orbits: Normal CT appearances of the orbital soft tissues. Mild mucoperiosteal disease of the bilateral ethmoidal paranasal sinuses. Chronic healed left nasal bone fracture  deformity. Mastoids: Dependent right mastoid fluid. Normal aeration  of the left mastoid air cells. IMPRESSION: Normal intracranial examination without acute intracranial hemorrhage. Dependent right mastoid fluid. Mild bilateral ethmoidal paranasal mucoperiosteal disease. Electronically Signed   By: Revonda Humphrey   On: 01/20/2020 23:59    Procedures Procedures (including critical care time)  Medications Ordered in ED Medications  ondansetron (ZOFRAN-ODT) disintegrating tablet 4 mg (4 mg Oral Given 01/20/20 2314)  HYDROmorphone (DILAUDID) injection 1 mg (1 mg Intramuscular Given 01/21/20 0040)    ED Course  I have reviewed the triage vital signs and the nursing notes.  Pertinent labs & imaging results that were available during my care of the patient were reviewed by me and considered in my medical decision making (see chart for details).    MDM Rules/Calculators/A&P  59 year old male here after a fall.  He was walking up steps without his walker when he lost his footing and fell on the right side.  Did strike his head but denies loss of consciousness.  Has had prior stroke so has baseline gait disturbance.  He is on Eliquis.  Here he is awake, alert, appropriately oriented.  Does have some residual left-sided LE weakness from prior stroke but this is unchanged.  He has no bruising or other outward signs of trauma on exam.  Does have some tenderness of the right eighth and ninth lateral ribs.  Lungs are clear bilaterally.  He is on his baseline 2 L supplemental O2 without any signs of distress.  Screening labs obtained from triage and are overall reassuring.  CT head is negative.  Rib films without acute fracture.  He requested shot of IM dilaudid which was given.    Discussed symptomatic management with patient -- he has incentive spirometer at home that he will use several times a day, he understands and remembers how to use this well.  Also is in pain contract so will continue home pain meds.  In regards to walking, discussed using his walker at all times to  prevent future falls.  He has someone coming in next week to install a ramp in his house which should help with this as well.  He will follow-up closely with his primary care doctor.  He may return here for any new or acute changes.  Final Clinical Impression(s) / ED Diagnoses Final diagnoses:  Fall, initial encounter  Rib pain on right side    Rx / DC Orders ED Discharge Orders    None       Larene Pickett, PA-C 24/26/83 [id number]    Delora Fuel, MD 22/29/79 [phone]

## 2020-01-21 NOTE — Discharge Instructions (Addendum)
Continue your home pain medication. Try to use your walker at home to prevent recurrent falls. Follow-up with your primary care doctor. Return here for any new/acute changes.

## 2020-02-07 ENCOUNTER — Emergency Department (HOSPITAL_COMMUNITY)
Admission: EM | Admit: 2020-02-07 | Discharge: 2020-02-07 | Disposition: A | Discharge status: Home / Self Care | Payer: Medicare Other | Attending: Emergency Medicine | Admitting: Emergency Medicine

## 2020-02-07 ENCOUNTER — Encounter (HOSPITAL_COMMUNITY): Payer: Self-pay | Admitting: Emergency Medicine

## 2020-02-07 ENCOUNTER — Other Ambulatory Visit: Payer: Self-pay

## 2020-02-07 DIAGNOSIS — Z5321 Procedure and treatment not carried out due to patient leaving prior to being seen by health care provider: Secondary | ICD-10-CM | POA: Diagnosis not present

## 2020-02-07 DIAGNOSIS — R109 Unspecified abdominal pain: Secondary | ICD-10-CM | POA: Diagnosis not present

## 2020-02-07 MED ORDER — SODIUM CHLORIDE 0.9% FLUSH: 3.0000 mL | Freq: Once | INTRAVENOUS | Status: DC

## 2020-02-07 NOTE — ED Triage Notes (Signed)
Pt c/o right side lower abd pain since this am. With nausea and vomiting, on a off fever.

## 2021-07-01 ENCOUNTER — Other Ambulatory Visit: Payer: Self-pay

## 2021-07-01 ENCOUNTER — Emergency Department (HOSPITAL_COMMUNITY)
Admission: EM | Admit: 2021-07-01 | Discharge: 2021-07-01 | Disposition: A | Discharge status: Home / Self Care | Payer: Medicare Other | Attending: Emergency Medicine | Admitting: Emergency Medicine

## 2021-07-01 ENCOUNTER — Emergency Department (HOSPITAL_COMMUNITY): Payer: Medicare Other

## 2021-07-01 ENCOUNTER — Encounter (HOSPITAL_COMMUNITY): Payer: Self-pay | Admitting: Emergency Medicine

## 2021-07-01 DIAGNOSIS — Z85818 Personal history of malignant neoplasm of other sites of lip, oral cavity, and pharynx: Secondary | ICD-10-CM | POA: Insufficient documentation

## 2021-07-01 DIAGNOSIS — M25551 Pain in right hip: Secondary | ICD-10-CM | POA: Diagnosis present

## 2021-07-01 DIAGNOSIS — W1830XA Fall on same level, unspecified, initial encounter: Secondary | ICD-10-CM | POA: Diagnosis not present

## 2021-07-01 DIAGNOSIS — J45909 Unspecified asthma, uncomplicated: Secondary | ICD-10-CM | POA: Diagnosis not present

## 2021-07-01 DIAGNOSIS — F17210 Nicotine dependence, cigarettes, uncomplicated: Secondary | ICD-10-CM | POA: Insufficient documentation

## 2021-07-01 DIAGNOSIS — I110 Hypertensive heart disease with heart failure: Secondary | ICD-10-CM | POA: Insufficient documentation

## 2021-07-01 DIAGNOSIS — Z79899 Other long term (current) drug therapy: Secondary | ICD-10-CM | POA: Insufficient documentation

## 2021-07-01 DIAGNOSIS — I509 Heart failure, unspecified: Secondary | ICD-10-CM | POA: Diagnosis not present

## 2021-07-01 DIAGNOSIS — E119 Type 2 diabetes mellitus without complications: Secondary | ICD-10-CM | POA: Diagnosis not present

## 2021-07-01 DIAGNOSIS — J449 Chronic obstructive pulmonary disease, unspecified: Secondary | ICD-10-CM | POA: Insufficient documentation

## 2021-07-01 DIAGNOSIS — Z7901 Long term (current) use of anticoagulants: Secondary | ICD-10-CM | POA: Diagnosis not present

## 2021-07-01 DIAGNOSIS — Z794 Long term (current) use of insulin: Secondary | ICD-10-CM | POA: Insufficient documentation

## 2021-07-01 DIAGNOSIS — W19XXXA Unspecified fall, initial encounter: Secondary | ICD-10-CM

## 2021-07-01 MED ORDER — HYDROMORPHONE HCL 1 MG/ML IJ SOLN
1.0000 mg | Freq: Once | INTRAMUSCULAR | Status: AC
  Administered 2021-07-01: 1 mg via INTRAMUSCULAR
  Filled 2021-07-01: qty 1

## 2021-07-01 MED ORDER — OXYCODONE-ACETAMINOPHEN 5-325 MG PO TABS
2.0000 | ORAL_TABLET | Freq: Once | ORAL | Status: AC
  Administered 2021-07-01: 2 via ORAL
  Filled 2021-07-01: qty 2

## 2021-07-01 NOTE — ED Provider Notes (Signed)
[facility] EMERGENCY DEPARTMENT Provider Note   CSN: [id number] Arrival date & time: 07/01/21  1426     History No chief complaint on file.   Roger Flores is a 60 y.o. male.  HPI  60 year old male presenting to the emergency department with concern for right hip pain after a ground-level fall today.  The patient is currently a hospice patient for metastatic lung cancer.  He is on anticoagulation with Eliquis and Plavix.  The patient adamantly refuses CT imaging of his head or any other CT imaging at this time.  He is alert and oriented x3 and is his own healthcare decision maker and displays competency.  He is requesting plain films to evaluate for possible hip fracture.  He is ambulatory in the emergency department on his own baseline oxygen requirement.  Past Medical History:  Diagnosis Date   Anxiety    Arthritis    knees and back   Asthma    Cancer ([place])    Larynx   Cataract    CHF (congestive heart failure) ([facility])    COPD (chronic obstructive pulmonary disease) ([facility])    Depression    Diabetes mellitus without complication ([facility])    Emphysema of lung ([facility])    GERD (gastroesophageal reflux disease)    Hyperlipidemia    Hypertension    Kidney disease    Liver disease    Lung mass    Myocardial infarction ([place])    Opioid withdrawal ([place])    Oxygen deficiency    Stroke ([place])    Tobacco use     Patient Active Problem List   Diagnosis Date Noted   Sepsis ([place]) 10/11/2017   Cellulitis of leg, left 10/11/2017   Chronic pain 07/24/2017   Stroke ([place]) 07/21/2017   History of cancer of larynx 07/21/2017   Self-catheterizes urinary bladder 07/21/2017   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 07/21/2017   DNR no code (do not resuscitate) 07/21/2017   Insulin dependent diabetes mellitus 07/21/2017   Pressure injury of skin 07/19/2017   Chest pain 07/18/2017   COPD, very severe ([place]) 07/18/2017   Unstable angina pectoris ([place]) 07/01/2017    Past  Surgical History:  Procedure Laterality Date   NECK SURGERY     THROAT SURGERY         Family History  Problem Relation Age of Onset   Arthritis Mother    Asthma Mother    COPD Mother    Depression Mother    Hyperlipidemia Mother    Hypertension Mother    Miscarriages / [place] Mother    Alzheimer's disease Mother    Alcohol abuse Father    Drug abuse Father    Early death Father 32       gunshot   Heart disease Brother 47   Early death Son        SIDS   Early death Son        MVA    Social History   Tobacco Use   Smoking status: Every Day    Packs/day: 1.00    Types: Cigarettes    Start date: 11/25/1977   Smokeless tobacco: Never  Vaping Use   Vaping Use: Never used  Substance Use Topics   Alcohol use: No   Drug use: No    Home Medications Prior to Admission medications   Medication Sig Start Date End Date Taking? Authorizing Provider  albuterol (PROVENTIL) (2.5 MG/3ML) 0.083% nebulizer solution Take 3 mLs (2.5 mg total)  by nebulization every 6 (six) hours as needed for wheezing or shortness of breath. Patient taking differently: Take 2.5 mg by nebulization every 4 (four) hours.  07/20/17   Kathie Dike, MD  apixaban (ELIQUIS) 5 MG TABS tablet Take 1 tablet (5 mg total) by mouth 2 (two) times daily. 07/04/17   Jola Schmidt, MD  feeding supplement, GLUCERNA SHAKE, (GLUCERNA SHAKE) LIQD Take 237 mLs 5 (five) times daily by mouth.     [provider]  furosemide (LASIX) 40 MG tablet Take 60 mg by mouth 2 (two) times daily as needed for fluid.     [provider]  insulin NPH Human (HUMULIN N,NOVOLIN N) 100 UNIT/ML injection Inject 65 Units into the skin 2 (two) times daily before a meal.     [provider]  insulin regular (NOVOLIN R,HUMULIN R) 100 units/mL injection Inject 75 Units into the skin in the morning, at noon, in the evening, and at bedtime.     [provider]  metoprolol succinate (TOPROL-XL) 25 MG 24 hr tablet  Take 25 mg by mouth daily. 01/20/18   [provider]  nitroGLYCERIN (NITROSTAT) 0.4 MG SL tablet Place 0.4 mg under the tongue every 5 (five) minutes as needed for chest pain.  05/23/17   [provider]  oxyCODONE (ROXICODONE) 15 MG immediate release tablet Take 15 mg by mouth 3 (three) times daily as needed for pain.  12/07/19   [provider]  OXYGEN Inhale 3 L continuous into the lungs. At home     [provider]  prochlorperazine (COMPAZINE) 5 MG tablet Take 5 mg by mouth every 6 (six) hours as needed for nausea or vomiting.    [provider]  tamsulosin (FLOMAX) 0.4 MG CAPS capsule Take 0.4 mg daily after breakfast by mouth.     [provider]    Allergies    Ibuprofen and Tylenol [acetaminophen]  Review of Systems   Review of Systems  Constitutional:  Negative for chills and fever.  HENT:  Negative for ear pain and sore throat.   Eyes:  Negative for pain and visual disturbance.  Respiratory:  Negative for cough and shortness of breath.   Cardiovascular:  Negative for chest pain and palpitations.  Gastrointestinal:  Negative for abdominal pain and vomiting.  Genitourinary:  Negative for dysuria and hematuria.  Musculoskeletal:  Positive for arthralgias. Negative for back pain.  Skin:  Negative for color change and rash.  Neurological:  Positive for headaches. Negative for seizures and syncope.  All other systems reviewed and are negative.  Physical Exam Updated Vital Signs BP (!) 144/97 (BP Location: Right Arm)   Pulse 97   Temp 98 F (36.7 C)   Resp (!) 22   SpO2 100%   Physical Exam Vitals and nursing note reviewed.  Constitutional:      Appearance: He is well-developed.     Comments: GCS 15, ABC intact  HENT:     Head: Normocephalic and atraumatic.  Eyes:     Conjunctiva/sclera: Conjunctivae normal.  Neck:     Comments: No midline tenderness to palpation of the cervical spine. ROM intact. Cardiovascular:      Rate and Rhythm: Normal rate and regular rhythm.     Heart sounds: No murmur heard. Pulmonary:     Effort: No respiratory distress.     Breath sounds: Rhonchi present.  Chest:     Comments: Chest wall stable and non-tender to AP and lateral compression. Clavicles stable and non-tender  to AP compression Abdominal:     Palpations: Abdomen is soft.     Tenderness: There is no abdominal tenderness.     Comments: Pelvis stable to lateral compression.  Musculoskeletal:     Cervical back: Neck supple.     Comments: No midline tenderness to palpation of the thoracic or lumbar spine. Extremities with intact ROM. Tenderness of the right hip. Ambulatory.  Skin:    General: Skin is warm and dry.  Neurological:     Mental Status: He is alert.     Comments: CN II-XII grossly intact. Moving all four extremities spontaneously and sensation grossly intact.    ED Results / Procedures / Treatments   Labs (all labs ordered are listed, but only abnormal results are displayed) Labs Reviewed - No data to display  EKG None  Radiology No results found.  Procedures Procedures   Medications Ordered in ED Medications  oxyCODONE-acetaminophen (PERCOCET/ROXICET) 5-325 MG per tablet 2 tablet (has no administration in time range)    ED Course  I have reviewed the triage vital signs and the nursing notes.  Pertinent labs & imaging results that were available during my care of the patient were reviewed by me and considered in my medical decision making (see chart for details).    MDM Rules/Calculators/A&P                           60 year old male presenting to the emergency department with concern for right hip pain after a ground-level fall today.  The patient is currently a hospice patient for metastatic lung cancer.  He is on anticoagulation with Eliquis and Plavix.  The patient adamantly refuses CT imaging of his head or any other CT imaging at this time.  He is alert and oriented x3 and is his  own healthcare decision maker and displays competency.  He is requesting plain films to evaluate for possible hip fracture.  He is ambulatory in the emergency department on his own baseline oxygen requirement.  Explained to the patient the significant risk of intracranial hemorrhage after a fall and head trauma while on Eliquis.  The patient understands these risks and elected to decline further CT imaging at this time.  He was alert and oriented x3, neurologically intact, determined to be competent to make his own medical decisions.  He states that he would not act on any positive findings given his current status on hospice.  X-ray imaging of the pelvis was performed which resulted negative for acute fracture.  The patient declined any further work-up at this time and was subsequently discharged.  Ambulatory in the emergency department at discharge. Final Clinical Impression(s) / ED Diagnoses Final diagnoses:  Fall    Rx / DC Orders ED Discharge Orders     None        Regan Lemming, MD 07/02/21 1342

## 2021-07-01 NOTE — ED Provider Notes (Signed)
Emergency Medicine Provider Triage Evaluation Note  Roger Flores , a 60 y.o. male  was evaluated in triage.  Pt complains of who presents with concern for right hip pain after fall today.  Patient is currently on hospice for metastatic lung cancer.  He is anticoagulated with Eliquis and Plavix, however refusing CT scan or any further work-up aside from plain films of the hip.  States that he has not going to act on any results that we find, but he does want to know if his hip is broken.  Review of Systems  Positive: Right hip pain, shortness of breath, headache Negative: LOC, blurry or double vision  Physical Exam  BP (!) 144/97 (BP Location: Right Arm)   Pulse 97   Temp 98 F (36.7 C)   Resp (!) 22   SpO2 100%  Gen:   Awake, no distress   Resp:  Increased work of breathing, rales or the lung fields bilaterally, on portable oxygen MSK:   Moves extremities without difficulty  Other:  Tenderness palpation over the right posterior superior iliac spine and greater trochanter , no midline tenderness palpation of the spine   Medical Decision Making  Medically screening exam initiated at 2:46 PM.  Appropriate orders placed.  JARMAN LITTON was informed that the remainder of the evaluation will be completed by another provider, this initial triage assessment does not replace that evaluation, and the importance of remaining in the ED until their evaluation is complete.  This chart was dictated using voice recognition software, Dragon. Despite the best efforts of this provider to proofread and correct errors, errors may still occur which can change documentation meaning.    Emeline Darling, PA-C 07/01/21 1447    Regan Lemming, MD 07/01/21 1453

## 2021-07-01 NOTE — ED Triage Notes (Signed)
Pt on hospice for lung cancer.  Reports dizziness when getting out of shower.  Fell and hit head approx 1 hour ago.  C/o R hip pain, posterior head pain, and lower back pain.  Takes Eliquis and Plavix.  Still reports dizziness.    Pt refused EKG on arrival and states he is dying and doesn't want any unnecessary test.  PA in speaking with pt.

## 2021-07-01 NOTE — ED Notes (Signed)
Pt's wife is at bedside and is driving pt home

## 2021-07-01 NOTE — Discharge Instructions (Addendum)
You have declined further work-up in the emergency department to evaluate for possible acute life-threatening injuries.  Here provide pain control for your right hip pain.  There was no acute fracture identified on hip x-ray.  We offered further imaging given your persistent pain which was declined.  Your mental status could decline given your fall, head trauma and known anticoagulant use.  You are at risk for a brain bleed.

## 2021-10-30 ENCOUNTER — Emergency Department (HOSPITAL_COMMUNITY): Payer: Medicare HMO

## 2021-10-30 ENCOUNTER — Other Ambulatory Visit: Payer: Self-pay

## 2021-10-30 ENCOUNTER — Emergency Department (HOSPITAL_COMMUNITY)
Admission: EM | Admit: 2021-10-30 | Discharge: 2021-10-31 | Disposition: A | Discharge status: Home / Self Care | Payer: Medicare HMO | Attending: Emergency Medicine | Admitting: Emergency Medicine

## 2021-10-30 DIAGNOSIS — Z7901 Long term (current) use of anticoagulants: Secondary | ICD-10-CM | POA: Diagnosis not present

## 2021-10-30 DIAGNOSIS — J181 Lobar pneumonia, unspecified organism: Secondary | ICD-10-CM | POA: Insufficient documentation

## 2021-10-30 DIAGNOSIS — F17210 Nicotine dependence, cigarettes, uncomplicated: Secondary | ICD-10-CM | POA: Diagnosis not present

## 2021-10-30 DIAGNOSIS — D72829 Elevated white blood cell count, unspecified: Secondary | ICD-10-CM | POA: Insufficient documentation

## 2021-10-30 DIAGNOSIS — Z79899 Other long term (current) drug therapy: Secondary | ICD-10-CM | POA: Insufficient documentation

## 2021-10-30 DIAGNOSIS — E119 Type 2 diabetes mellitus without complications: Secondary | ICD-10-CM | POA: Diagnosis not present

## 2021-10-30 DIAGNOSIS — Z794 Long term (current) use of insulin: Secondary | ICD-10-CM | POA: Insufficient documentation

## 2021-10-30 DIAGNOSIS — J189 Pneumonia, unspecified organism: Secondary | ICD-10-CM

## 2021-10-30 DIAGNOSIS — I509 Heart failure, unspecified: Secondary | ICD-10-CM | POA: Insufficient documentation

## 2021-10-30 DIAGNOSIS — Z8521 Personal history of malignant neoplasm of larynx: Secondary | ICD-10-CM | POA: Insufficient documentation

## 2021-10-30 DIAGNOSIS — R112 Nausea with vomiting, unspecified: Secondary | ICD-10-CM | POA: Diagnosis not present

## 2021-10-30 DIAGNOSIS — Z85118 Personal history of other malignant neoplasm of bronchus and lung: Secondary | ICD-10-CM | POA: Insufficient documentation

## 2021-10-30 DIAGNOSIS — I110 Hypertensive heart disease with heart failure: Secondary | ICD-10-CM | POA: Diagnosis not present

## 2021-10-30 DIAGNOSIS — J45909 Unspecified asthma, uncomplicated: Secondary | ICD-10-CM | POA: Insufficient documentation

## 2021-10-30 DIAGNOSIS — J449 Chronic obstructive pulmonary disease, unspecified: Secondary | ICD-10-CM | POA: Diagnosis not present

## 2021-10-30 DIAGNOSIS — R1110 Vomiting, unspecified: Secondary | ICD-10-CM | POA: Diagnosis present

## 2021-10-30 LAB — COMPREHENSIVE METABOLIC PANEL
ALT: 19 U/L (ref 0–44)
AST: 22 U/L (ref 15–41)
Albumin: 4.2 g/dL (ref 3.5–5.0)
Alkaline Phosphatase: 49 U/L (ref 38–126)
Anion gap: 17 — ABNORMAL HIGH (ref 5–15)
BUN: 25 mg/dL — ABNORMAL HIGH (ref 6–20)
CO2: 27 mmol/L (ref 22–32)
Calcium: 9.6 mg/dL (ref 8.9–10.3)
Chloride: 86 mmol/L — ABNORMAL LOW (ref 98–111)
Creatinine, Ser: 1.14 mg/dL (ref 0.61–1.24)
GFR, Estimated: >60 mL/min (ref >60)
Glucose, Bld: 265 mg/dL — ABNORMAL HIGH (ref 70–99)
Potassium: 3.2 mmol/L — ABNORMAL LOW (ref 3.5–5.1)
Sodium: 130 mmol/L — ABNORMAL LOW (ref 135–145)
Total Bilirubin: 0.8 mg/dL (ref 0.3–1.2)
Total Protein: 7.9 g/dL (ref 6.5–8.1)

## 2021-10-30 LAB — CBC WITH DIFFERENTIAL/PLATELET
Abs Immature Granulocytes: 0.09 10*3/uL — ABNORMAL HIGH (ref 0.00–0.07)
Basophils Absolute: 0.1 10*3/uL (ref 0.0–0.1)
Basophils Relative: 1 %
Eosinophils Absolute: 0 10*3/uL (ref 0.0–0.5)
Eosinophils Relative: 0 %
HCT: 50.4 % (ref 39.0–52.0)
Hemoglobin: 17 g/dL (ref 13.0–17.0)
Immature Granulocytes: 1 %
Lymphocytes Relative: 17 %
Lymphs Abs: 2.3 10*3/uL (ref 0.7–4.0)
MCH: 29.1 pg (ref 26.0–34.0)
MCHC: 33.7 g/dL (ref 30.0–36.0)
MCV: 86.3 fL (ref 80.0–100.0)
Monocytes Absolute: 0.4 10*3/uL (ref 0.1–1.0)
Monocytes Relative: 3 %
Neutro Abs: 11.2 10*3/uL — ABNORMAL HIGH (ref 1.7–7.7)
Neutrophils Relative %: 78 %
Platelets: 486 10*3/uL — ABNORMAL HIGH (ref 150–400)
RBC: 5.84 MIL/uL — ABNORMAL HIGH (ref 4.22–5.81)
RDW: 14.7 % (ref 11.5–15.5)
WBC: 14.1 10*3/uL — ABNORMAL HIGH (ref 4.0–10.5)
nRBC: 0 % (ref 0.0–0.2)

## 2021-10-30 LAB — URINALYSIS, ROUTINE W REFLEX MICROSCOPIC
Bilirubin Urine: NEGATIVE
Glucose, UA: 100 mg/dL — AB
Ketones, ur: NEGATIVE mg/dL
Leukocytes,Ua: NEGATIVE
Nitrite: NEGATIVE
Protein, ur: NEGATIVE mg/dL
Specific Gravity, Urine: 1.01 (ref 1.005–1.030)
pH: 6 (ref 5.0–8.0)

## 2021-10-30 LAB — URINALYSIS, MICROSCOPIC (REFLEX)
Bacteria, UA: NONE SEEN
Squamous Epithelial / HPF: NONE SEEN (ref 0–5)

## 2021-10-30 LAB — LIPASE, BLOOD: Lipase: 47 U/L (ref 11–51)

## 2021-10-30 MED ORDER — IOHEXOL 300 MG/ML  SOLN
100.0000 mL | Freq: Once | INTRAMUSCULAR | Status: AC | PRN
  Administered 2021-10-30: 100 mL via INTRAVENOUS

## 2021-10-30 MED ORDER — ONDANSETRON HCL 4 MG/2ML IJ SOLN
4.0000 mg | Freq: Once | INTRAMUSCULAR | Status: AC
  Administered 2021-10-30: 4 mg via INTRAVENOUS
  Filled 2021-10-30: qty 2

## 2021-10-30 MED ORDER — ONDANSETRON 4 MG PO TBDP
4.0000 mg | ORAL_TABLET | Freq: Once | ORAL | Status: AC
  Administered 2021-10-30: 4 mg via ORAL
  Filled 2021-10-30: qty 1

## 2021-10-30 MED ORDER — FUROSEMIDE 10 MG/ML IJ SOLN
40.0000 mg | Freq: Once | INTRAMUSCULAR | Status: AC
  Administered 2021-10-30: 40 mg via INTRAVENOUS
  Filled 2021-10-30: qty 4

## 2021-10-30 MED ORDER — HYDROMORPHONE HCL 1 MG/ML IJ SOLN
2.0000 mg | Freq: Once | INTRAMUSCULAR | Status: AC
  Administered 2021-10-30: 2 mg via INTRAVENOUS
  Filled 2021-10-30: qty 2

## 2021-10-30 MED ORDER — HYDROMORPHONE HCL 1 MG/ML IJ SOLN
2.0000 mg | Freq: Once | INTRAMUSCULAR | Status: AC
  Administered 2021-10-31: 2 mg via INTRAVENOUS
  Filled 2021-10-30: qty 2

## 2021-10-30 MED ORDER — LEVOFLOXACIN IN D5W 750 MG/150ML IV SOLN
750.0000 mg | Freq: Once | INTRAVENOUS | Status: AC
  Administered 2021-10-30: 750 mg via INTRAVENOUS
  Filled 2021-10-30: qty 150

## 2021-10-30 MED ORDER — SODIUM CHLORIDE 0.9 % IV BOLUS
1000.0000 mL | Freq: Once | INTRAVENOUS | Status: AC
  Administered 2021-10-30: 1000 mL via INTRAVENOUS

## 2021-10-30 NOTE — ED Provider Notes (Signed)
[facility] EMERGENCY DEPARTMENT Provider Note   CSN: [id number] Arrival date & time: 10/30/21  1938     History Chief Complaint  Patient presents with   Nausea    Ehrenfeld is a 60 y.o. male.  Patient with history of metastatic lung cancer, PE, MI, lymphoma, hypertension, hypercholesterolemia, GERD, DVT, stroke with deficits, diabetes, COPD, cirrhosis, CHF and multiple other issues who presents today with complaint of nausea and vomiting. He states that same has been ongoing for 2 days. Of note, patient with G tube feeds since 2011 following CVA. He states that he has emesis every time he gives himself tube feeds. He endorses new onset worsening associated abdominal pain throughout his abdomen with some diarrhea as well. He also states that he gives himself home meds including Elliquis and Lasix with minimal sips of water for which he has suction in place at home given his reduced ability to swallow. He has been unable to take his home meds since onset of symptoms which includes narcotics which prompted his visit today. He also endorses fluid retention and abdominal distention which he associates is due to not taking his Lasix for the past 2 days. He currently does not have hospice in place, his care is managed solely by his PCP, and states that his prognosis is terminal, therefore he is not receiving radiation or chemotherapy. Additionally, he had a fall while in the lobby in the ED this evening, states that he slipped out of his chair and hit his head on the ground. He is on anticoagulation with Eliquis and Plavix following a stroke a few weeks ago. The patient adamantly refuses CT imaging of his head at this time.  He is alert and oriented x3 and is his own healthcare decision maker and displays competency. He is ambulatory in the emergency department on his own baseline oxygen requirement.       Past Medical History:  Diagnosis Date   Anxiety    Arthritis     knees and back   Asthma    Cancer ([place])    Larynx   Cataract    CHF (congestive heart failure) (HCC)    COPD (chronic obstructive pulmonary disease) (HCC)    Depression    Diabetes mellitus without complication (HCC)    Emphysema of lung (HCC)    GERD (gastroesophageal reflux disease)    Hyperlipidemia    Hypertension    Kidney disease    Liver disease    Lung mass    Myocardial infarction ([place])    Opioid withdrawal ([place])    Oxygen deficiency    Stroke [facility]    Tobacco use     Patient Active Problem List   Diagnosis Date Noted   Sepsis ([place]) 10/11/2017   Cellulitis of leg, left 10/11/2017   Chronic pain 07/24/2017   Stroke ([place]) 07/21/2017   History of cancer of larynx 07/21/2017   Self-catheterizes urinary bladder 07/21/2017   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 07/21/2017   DNR no code (do not resuscitate) 07/21/2017   Insulin dependent diabetes mellitus 07/21/2017   Pressure injury of skin 07/19/2017   Chest pain 07/18/2017   COPD, very severe ([place]) 07/18/2017   Unstable angina pectoris ([place]) 07/01/2017    Past Surgical History:  Procedure Laterality Date   NECK SURGERY     THROAT SURGERY         Family History  Problem Relation Age of Onset   Arthritis Mother  Asthma Mother    COPD Mother    Depression Mother    Hyperlipidemia Mother    Hypertension Mother    Miscarriages / [place] Mother    Alzheimer's disease Mother    Alcohol abuse Father    Drug abuse Father    Early death Father 75       gunshot   Heart disease Brother 107   Early death Son        SIDS   Early death Son        MVA    Social History   Tobacco Use   Smoking status: Every Day    Packs/day: 1.00    Types: Cigarettes    Start date: 11/25/1977   Smokeless tobacco: Never  Vaping Use   Vaping Use: Never used  Substance Use Topics   Alcohol use: No   Drug use: No    Home Medications Prior to Admission medications   Medication Sig Start Date End Date Taking?  Authorizing Provider  albuterol (PROVENTIL) (2.5 MG/3ML) 0.083% nebulizer solution Take 3 mLs (2.5 mg total) by nebulization every 6 (six) hours as needed for wheezing or shortness of breath. Patient taking differently: Take 2.5 mg by nebulization every 4 (four) hours.  07/20/17   Kathie Dike, MD  apixaban (ELIQUIS) 5 MG TABS tablet Take 1 tablet (5 mg total) by mouth 2 (two) times daily. 07/04/17   Jola Schmidt, MD  feeding supplement, GLUCERNA SHAKE, (GLUCERNA SHAKE) LIQD Take 237 mLs 5 (five) times daily by mouth.     [provider]  furosemide (LASIX) 40 MG tablet Take 60 mg by mouth 2 (two) times daily as needed for fluid.     [provider]  insulin NPH Human (HUMULIN N,NOVOLIN N) 100 UNIT/ML injection Inject 65 Units into the skin 2 (two) times daily before a meal.     [provider]  insulin regular (NOVOLIN R,HUMULIN R) 100 units/mL injection Inject 75 Units into the skin in the morning, at noon, in the evening, and at bedtime.     [provider]  metoprolol succinate (TOPROL-XL) 25 MG 24 hr tablet Take 25 mg by mouth daily. 01/20/18   [provider]  nitroGLYCERIN (NITROSTAT) 0.4 MG SL tablet Place 0.4 mg under the tongue every 5 (five) minutes as needed for chest pain.  05/23/17   [provider]  oxyCODONE (ROXICODONE) 15 MG immediate release tablet Take 15 mg by mouth 3 (three) times daily as needed for pain.  12/07/19   [provider]  OXYGEN Inhale 3 L continuous into the lungs. At home     [provider]  prochlorperazine (COMPAZINE) 5 MG tablet Take 5 mg by mouth every 6 (six) hours as needed for nausea or vomiting.    [provider]  tamsulosin (FLOMAX) 0.4 MG CAPS capsule Take 0.4 mg daily after breakfast by mouth.     [provider]    Allergies    Ibuprofen and Tylenol [acetaminophen]  Review of Systems   Review of Systems  Constitutional:  Negative for chills and fever.   HENT:  Negative for congestion and rhinorrhea.   Respiratory:  Positive for cough and shortness of breath (unchanged from baseline). Negative for wheezing and stridor.   Cardiovascular:  Negative for chest pain, palpitations and leg swelling.  Gastrointestinal:  Positive for abdominal distention, abdominal pain, diarrhea, nausea and vomiting.  Genitourinary:  Negative for dysuria.  Neurological:  Negative for dizziness, tremors, seizures, syncope, facial  asymmetry, speech difficulty, weakness, light-headedness, numbness and headaches.  Hematological:  Bruises/bleeds easily.  Psychiatric/Behavioral:  Negative for confusion and decreased concentration.   All other systems reviewed and are negative.  Physical Exam Updated Vital Signs BP 106/70   Pulse 80   Temp 98.7 F (37.1 C) (Oral)   Resp 11   Ht 5\' 6"  (1.676 m)   Wt 77.1 kg   SpO2 97%   BMI 27.44 kg/m   Physical Exam Vitals and nursing note reviewed.  Constitutional:      Comments: Patient chronically ill-appearing resting in bed in some discomfort  HENT:     Head: Normocephalic and atraumatic.     Comments: No hematoma or wound noted to the head    Mouth/Throat:     Mouth: Mucous membranes are moist.  Eyes:     Extraocular Movements: Extraocular movements intact.     Pupils: Pupils are equal, round, and reactive to light.  Cardiovascular:     Rate and Rhythm: Normal rate and regular rhythm.     Heart sounds: Normal heart sounds.  Pulmonary:     Effort: Pulmonary effort is normal.     Breath sounds: Normal air entry. Examination of the right-middle field reveals rales. Examination of the right-lower field reveals rales. Examination of the left-lower field reveals rales. Rales present. No decreased breath sounds or wheezing.  Abdominal:     General: Abdomen is flat. Bowel sounds are normal. There is distension.     Palpations: Abdomen is soft.     Tenderness: There is abdominal tenderness. There is no right CVA  tenderness or left CVA tenderness.     Comments: Generalized tenderness throughout the abdomen and RLQ  Musculoskeletal:        General: Normal range of motion.     Cervical back: Normal range of motion and neck supple.     Right lower leg: No edema.     Left lower leg: No edema.     Comments: No midline tenderness, stepoffs, or deformity noted to the cervical, thoracic, or lumbar spine  Skin:    General: Skin is warm and dry.  Neurological:     General: No focal deficit present.     Mental Status: He is alert and oriented to person, place, and time.  Psychiatric:        Mood and Affect: Mood normal.        Behavior: Behavior normal.    ED Results / Procedures / Treatments   Labs (all labs ordered are listed, but only abnormal results are displayed) Labs Reviewed  CBC WITH DIFFERENTIAL/PLATELET - Abnormal; Notable for the following components:      Result Value   WBC 14.1 (*)    RBC 5.84 (*)    Platelets 486 (*)    Neutro Abs 11.2 (*)    Abs Immature Granulocytes 0.09 (*)    All other components within normal limits  COMPREHENSIVE METABOLIC PANEL - Abnormal; Notable for the following components:   Sodium 130 (*)    Potassium 3.2 (*)    Chloride 86 (*)    Glucose, Bld 265 (*)    BUN 25 (*)    Anion gap 17 (*)    All other components within normal limits  URINALYSIS, ROUTINE W REFLEX MICROSCOPIC - Abnormal; Notable for the following components:   Glucose, UA 100 (*)    Hgb urine dipstick MODERATE (*)    All other components within normal limits  LIPASE, BLOOD  URINALYSIS, MICROSCOPIC (  REFLEX)    EKG None  Radiology CT ABDOMEN PELVIS W CONTRAST  Result Date: 10/30/2021 CLINICAL DATA:  Abdominal distension with nausea and vomiting, initial encounter EXAM: CT ABDOMEN AND PELVIS WITH CONTRAST TECHNIQUE: Multidetector CT imaging of the abdomen and pelvis was performed using the standard protocol following bolus administration of intravenous contrast. CONTRAST:  162mL  OMNIPAQUE IOHEXOL 300 MG/ML  SOLN COMPARISON:  01/07/2020 FINDINGS: Lower chest: Lung bases demonstrate dependent atelectatic changes bilaterally. Patchy right middle lobe airspace opacity is noted likely representing early infiltrate. Hepatobiliary: Fatty infiltration of the liver is noted. Gallbladder has been surgically removed. Mild nodularity is noted which may represent some very early cirrhotic change. Pancreas: Unremarkable. No pancreatic ductal dilatation or surrounding inflammatory changes. Spleen: Normal in size without focal abnormality. Adrenals/Urinary Tract: Adrenal glands are within normal limits. Kidneys demonstrate a normal enhancement pattern bilaterally. No renal calculi or obstructive changes are seen. Delayed images demonstrate normal excretion of contrast. No calculi or obstructive changes are seen. The bladder is decompressed. Stomach/Bowel: The appendix is within normal limits. No obstructive or inflammatory changes of the colon are seen. Small bowel and stomach are within normal limits. Gastrostomy catheter is noted in place within the stomach. Vascular/Lymphatic: Aortic atherosclerosis. No enlarged abdominal or pelvic lymph nodes. Reproductive: Prostate is unremarkable. Other: No abdominal wall hernia or abnormality. No abdominopelvic ascites. Musculoskeletal: Degenerative change of lumbar spine is noted. IMPRESSION: Fatty liver with changes suggestive of early cirrhosis. Patchy right middle lobe infiltrate. No other focal abnormality is seen. Electronically Signed   By: Inez Catalina M.D.   On: 10/30/2021 21:50    Procedures Procedures   Medications Ordered in ED Medications  ondansetron (ZOFRAN-ODT) disintegrating tablet 4 mg (4 mg Oral Given 10/30/21 2036)  sodium chloride 0.9 % bolus 1,000 mL (1,000 mLs Intravenous New Bag/Given 10/30/21 2037)  HYDROmorphone (DILAUDID) injection 2 mg (2 mg Intravenous Given 10/30/21 2057)  furosemide (LASIX) injection 40 mg (40 mg Intravenous  Given 10/30/21 2150)  iohexol (OMNIPAQUE) 300 MG/ML solution 100 mL (100 mLs Intravenous Contrast Given 10/30/21 2141)    ED Course  I have reviewed the triage vital signs and the nursing notes.  Pertinent labs & imaging results that were available during my care of the patient were reviewed by me and considered in my medical decision making (see chart for details).    MDM Rules/Calculators/A&P                         Chronically ill patient with metastatic lung cancer presents today with emesis x 2 days. Gives himself tube feeds at baseline with some intake of po meds as they are required, however he endorses significant difficulty swallowing at baseline. He also fell in the ER this evening and is anticoagulated, however he refuses CT head imaging at this time. Risks explained for missing acute intracranial process, he is alert and oriented and able to make healthcare decisions at this time. Lungs with notable rales in bilateral lower lobes, however he also declines chest X-ray. He is satting 96% on his baseline O2 of 3L. Somewhat tender abdominal exam, in the presence of 2 days of emesis, discussed CT imaging with patient who is amenable to this. CBC with leukocytosis at 14.1, no anemia. Somewhat dry on physical exam with hyponatremia at 130, hypokalemia at 3.2, chloride at 86, BUN 25, Creatinine up from 0.80 --> 1.14 a year ago. Therefore, IV fluids were initiated. 500 cc received, however patient expressed concern  of fluid retention given that he has not been able to take his Lasix and has had abdominal distention, therefore he was given 40 mg IV Lasix as well. UA not infected.  CT abdomen revealed early cirrhosis with patchy right middle lobe infiltrate, no other focal abnormality seen. Concern for aspiration pneumonia. Will treat with Levoquin, one dose IV in the ER this evening and a liquid suspension to go home with for his G tube. Upon reexamination following administration of home meds, patient  endorses that he is feeling much better, no further episodes of emesis this evening. I have extensively discussed further management with this patient and he would like to go home with plans to see his PCP tomorrow for further discussion of tube feeding regimen and further. He has Zofran already at home. Plan to give 1 more dose of IV narcotics and discharge. He is amenable with this plan and has a ride home this evening.   This is a shared visit with supervising physician Dr. Darl Householder who has independently evaluated patient & provided guidance in evaluation/management/disposition, in agreement with care     Final Clinical Impression(s) / ED Diagnoses Final diagnoses:  Nausea and vomiting, unspecified vomiting type  Community acquired pneumonia of right middle lobe of lung    Rx / DC Orders ED Discharge Orders          Ordered    levofloxacin (LEVAQUIN) 25 MG/ML solution  Daily        10/31/21 0024          An After Visit Summary was printed and given to the patient.    Nestor Lewandowsky 10/31/21 0028    Drenda Freeze, MD 11/06/21 [phone]

## 2021-10-30 NOTE — ED Provider Notes (Signed)
Patient slid off chair in triage and hit his head on the floor. Patient on Eliquis. Patient activated as level 2 trauma due to fall on blood thinners. Patient brought back to room from triage.    Karie Kirks 10/30/21 2020    Blanchie Dessert, MD 10/30/21 2139

## 2021-10-30 NOTE — Progress Notes (Signed)
Orthopedic Tech Progress Note Patient Details:  Roger Flores Jun 10, 1961 [id number]  Patient ID: Roger Flores, male   DOB: October 03, 1961, 60 y.o.   MRN: [id number] Level 2 not needed. Roger Flores 10/30/2021, 8:29 PM

## 2021-10-30 NOTE — ED Provider Notes (Signed)
Emergency Medicine Provider Triage Evaluation Note  Roger Flores , a 60 y.o. male  was evaluated in triage.  Pt complains of nausea and vomiting x2 days.  Patient states he has been unable to tolerate p.o. including all of his medications.  Patient has a history of prostate cancer with metastatic disease to the lungs.  Patient states he currently resides in [facility] and has been out of power for the past few days and unable to reach his doctors.  Patient is not currently on chemotherapy or radiation. Patient states he has not been able to take his pain medications, so he has pain "all over".   Review of Systems  Positive: N/V Negative: fever  Physical Exam  BP 101/71 (BP Location: Left Arm)   Pulse 94   Temp 98.7 F (37.1 C) (Oral)   Resp 20   Ht 5\' 6"  (1.676 m)   Wt 77.1 kg   SpO2 96%   BMI 27.44 kg/m  Gen:   Awake, no distress   Resp:  Normal effort  MSK:   Moves extremities without difficulty  Other:    Medical Decision Making  Medically screening exam initiated at 8:07 PM.  Appropriate orders placed.  DELANEY SCHNICK was informed that the remainder of the evaluation will be completed by another provider, this initial triage assessment does not replace that evaluation, and the importance of remaining in the ED until their evaluation is complete.  Abdominal labs Zofran given in triage   Karie Kirks 10/30/21 2009    Blanchie Dessert, MD 10/30/21 2139

## 2021-10-30 NOTE — ED Notes (Addendum)
Trauma Response Nurse Note-  Reason for Call / Reason for Trauma activation:   - Level 2 trauma, fall on blood thinners and hit head.  Initial Focused Assessment (If applicable, or please see trauma documentation):  - pt alert and oriented. No external hemorrhage noted.   Interventions:  - IV started. Blood work obtained in triage. Pt alert and oriented. Pt refusing CT scans  Event Summary:   - Pt presented to the ED with a history of cancer. Pt states his PCP sent him here for pain and nausea treatment, and while in triage he slid out of the chair he was in and hit his head. Pt alert and oriented and states he is dying and does not want CT scans. Provider at bedside and aware of pt not wanting CT scans. Pt does say that he has a CT head to be completed next week.  Provider stated no portable x-rays needed

## 2021-10-30 NOTE — ED Triage Notes (Signed)
Pt reported to ED with c/o nausea and vomiting and severe pain from inability to tolerate oral medication. Pt reports terminal cancer and severe pain. States he is also affected by blackout in [facility] and has been experiencing rapid weight loss due to condition.

## 2021-10-30 NOTE — Progress Notes (Signed)
   10/30/21 2020  Clinical Encounter Type  Visited With Patient and family together  Visit Type Trauma  Referral From Nurse  Consult/Referral To Chaplain   Chaplain Jorene Guest responded to the level 2 page. The patient's wife was at the bedside. Ike Bene actively listened as the patient said he was dying and needed to prepare for death, and he was afraid. He explored the lack of spirituality in his life and asked questions about forgiveness. The patient confessed mistakes he had made and had concerns about receiving salvation. Ike Bene offered words of hope and inspiration, reading the sacred text and prayer. Ike Bene encouraged the patient to connect with someone in his faith community.This note was prepared by Jeanine Luz, M.Div..  For questions please contact by phone [phone].

## 2021-10-31 MED ORDER — LEVOFLOXACIN 25 MG/ML PO SOLN: 750.0000 mg | Freq: Every day | ORAL | 0 refills | Status: AC

## 2021-10-31 NOTE — Discharge Instructions (Addendum)
Your workup in the ED revealed that you have pneumonia. I have given you 1 dose of IV antibiotics for this and have written you a prescription for a solution of the same antibiotic which you can place in your G tube. Please take this entire dose as prescribed.   Additionally, it is very important that you follow up with your PCP as soon as possible for discussion of long term management of your symptoms. I think that trying to take scheduled Zofran as well as decreasing your tube feeds and slowing the rate of administration of your tube feeds are all good starting points to trouble shoot the issue as your CT scan did not reveal an explanation for your symptoms.   Please return to the ER if there is any change in your condition or for any new or worsening symptoms.
